# Patient Record
Sex: Male | Born: 1965 | Race: Black or African American | Hispanic: No | Marital: Married | Smoking: Current some day smoker
Health system: Southern US, Community
[De-identification: ages and names within clinical notes are randomized; demographics above are authoritative.]

## PROBLEM LIST (undated history)

## (undated) DIAGNOSIS — E669 Obesity, unspecified: Secondary | ICD-10-CM

## (undated) DIAGNOSIS — I10 Essential (primary) hypertension: Secondary | ICD-10-CM

## (undated) DIAGNOSIS — R079 Chest pain, unspecified: Secondary | ICD-10-CM

## (undated) DIAGNOSIS — N12 Tubulo-interstitial nephritis, not specified as acute or chronic: Secondary | ICD-10-CM

## (undated) DIAGNOSIS — M1990 Unspecified osteoarthritis, unspecified site: Secondary | ICD-10-CM

## (undated) DIAGNOSIS — R109 Unspecified abdominal pain: Secondary | ICD-10-CM

## (undated) DIAGNOSIS — J069 Acute upper respiratory infection, unspecified: Secondary | ICD-10-CM

## (undated) DIAGNOSIS — R600 Localized edema: Secondary | ICD-10-CM

## (undated) DIAGNOSIS — M7989 Other specified soft tissue disorders: Secondary | ICD-10-CM

## (undated) DIAGNOSIS — K219 Gastro-esophageal reflux disease without esophagitis: Secondary | ICD-10-CM

## (undated) DIAGNOSIS — M545 Low back pain, unspecified: Secondary | ICD-10-CM

## (undated) HISTORY — DX: Low back pain, unspecified: M54.50

## (undated) HISTORY — DX: Chest pain, unspecified: R07.9

## (undated) HISTORY — DX: Obesity, unspecified: E66.9

## (undated) HISTORY — DX: Unspecified abdominal pain: R10.9

## (undated) HISTORY — DX: Tubulo-interstitial nephritis, not specified as acute or chronic: N12

## (undated) HISTORY — DX: Acute upper respiratory infection, unspecified: J06.9

## (undated) HISTORY — DX: Localized edema: R60.0

## (undated) HISTORY — DX: Other specified soft tissue disorders: M79.89

## (undated) HISTORY — DX: Essential (primary) hypertension: I10

---

## 2013-03-18 ENCOUNTER — Emergency Department: Payer: BLUE CROSS/BLUE SHIELD

## 2013-03-18 ENCOUNTER — Emergency Department
Admission: EM | Admit: 2013-03-18 | Discharge: 2013-03-18 | Disposition: A | Discharge status: Home / Self Care | Payer: BLUE CROSS/BLUE SHIELD | Attending: Emergency Medicine | Admitting: Emergency Medicine

## 2013-03-18 DIAGNOSIS — K219 Gastro-esophageal reflux disease without esophagitis: Secondary | ICD-10-CM | POA: Insufficient documentation

## 2013-03-18 DIAGNOSIS — J40 Bronchitis, not specified as acute or chronic: Secondary | ICD-10-CM | POA: Insufficient documentation

## 2013-03-18 DIAGNOSIS — E669 Obesity, unspecified: Secondary | ICD-10-CM | POA: Insufficient documentation

## 2013-03-18 DIAGNOSIS — I10 Essential (primary) hypertension: Secondary | ICD-10-CM | POA: Insufficient documentation

## 2013-03-18 DIAGNOSIS — F17200 Nicotine dependence, unspecified, uncomplicated: Secondary | ICD-10-CM | POA: Insufficient documentation

## 2013-03-18 HISTORY — DX: Essential (primary) hypertension: I10

## 2013-03-18 HISTORY — DX: Gastro-esophageal reflux disease without esophagitis: K21.9

## 2013-03-18 LAB — ECG 12-LEAD
Atrial Rate: 98 {beats}/min
P Axis: 53 degrees
P-R Interval: 170 ms
Q-T Interval: 352 ms
QRS Duration: 92 ms
QTC Calculation (Bezet): 449 ms
R Axis: 39 degrees
T Axis: 11 degrees
Ventricular Rate: 98 {beats}/min

## 2013-03-18 MED ORDER — IPRATROPIUM BROMIDE 0.02 % IN SOLN
1.0000 mg | Freq: Once | RESPIRATORY_TRACT | Status: AC
Start: 2013-03-18 — End: 2013-03-18
  Administered 2013-03-18: 1 mg via RESPIRATORY_TRACT
  Filled 2013-03-18: qty 5

## 2013-03-18 MED ORDER — PREDNISONE 20 MG PO TABS
60.0000 mg | ORAL_TABLET | Freq: Once | ORAL | Status: AC
Start: 2013-03-18 — End: 2013-03-18
  Administered 2013-03-18: 60 mg via ORAL
  Filled 2013-03-18: qty 3

## 2013-03-18 MED ORDER — PREDNISONE 20 MG PO TABS
40.00 mg | ORAL_TABLET | Freq: Every day | ORAL | Status: AC
Start: 2013-03-18 — End: 2013-03-23

## 2013-03-18 MED ORDER — ACETAMINOPHEN 325 MG PO TABS
650.0000 mg | ORAL_TABLET | Freq: Once | ORAL | Status: AC
Start: 2013-03-18 — End: 2013-03-18
  Administered 2013-03-18: 650 mg via ORAL
  Filled 2013-03-18: qty 2

## 2013-03-18 MED ORDER — ALBUTEROL SULFATE HFA 108 (90 BASE) MCG/ACT IN AERS
2.0000 | INHALATION_SPRAY | RESPIRATORY_TRACT | Status: DC | PRN
Start: 2013-03-18 — End: 2013-04-17

## 2013-03-18 MED ORDER — ALBUTEROL SULFATE (5 MG/ML) 0.5% IN NEBU
5.0000 mg | INHALATION_SOLUTION | Freq: Once | RESPIRATORY_TRACT | Status: AC
Start: 2013-03-18 — End: 2013-03-18
  Administered 2013-03-18: 5 mg via RESPIRATORY_TRACT
  Filled 2013-03-18: qty 1

## 2013-03-18 NOTE — ED Provider Notes (Signed)
Physician/Midlevel provider first contact with patient: 03/18/13 1191         History     Chief Complaint   Patient presents with   . Cough     HPI Comments: 47 y.o. Male with PMHX HTN here with 2 days of cough congestion HA and wheezing. Pt had rx for Z pak and tessalon pearls from prior doc visit and started taking it. Pt also c/o cp with cough only. No n/v ab pain neck pain fever    Patient is a 47 y.o. male presenting with cough. The history is provided by the patient.   Cough  This is a new problem. The current episode started 2 days ago. The problem occurs constantly. The cough is productive of sputum. Associated symptoms include chest pain, headaches, rhinorrhea and wheezing. Pertinent negatives include no chills and no shortness of breath.       Past Medical History   Diagnosis Date   . Hypertension    . GERD (gastroesophageal reflux disease)        History reviewed. No pertinent past surgical history.    No family history on file.    Social  History   Substance Use Topics   . Smoking status: Light Tobacco Smoker   . Smokeless tobacco: Not on file   . Alcohol Use: Yes      Comment: occasionally        .     No Known Allergies    Current/Home Medications    AMLODIPINE-BENAZEPRIL (LOTREL) 10-20 MG PER CAPSULE    Take 1 capsule by mouth daily.    CLONIDINE (CATAPRES) 0.3 MG TABLET    Take 0.3 mg by mouth 2 (two) times daily.    HYDROCHLOROTHIAZIDE (HYDRODIURIL) 25 MG TABLET    Take 25 mg by mouth daily.    OMEPRAZOLE (PRILOSEC) 40 MG CAPSULE    Take 40 mg by mouth daily.        Review of Systems   Constitutional: Negative for fever and chills.   HENT: Positive for rhinorrhea and sneezing. Negative for congestion.    Eyes: Negative for pain and discharge.   Respiratory: Positive for cough and wheezing. Negative for chest tightness and shortness of breath.    Cardiovascular: Positive for chest pain. Negative for palpitations and leg swelling.   Gastrointestinal: Negative for nausea, vomiting, abdominal pain and  diarrhea.   Genitourinary: Negative for dysuria, frequency and hematuria.   Musculoskeletal: Negative for back pain.   Skin: Negative for pallor.   Neurological: Positive for headaches. Negative for dizziness, syncope, speech difficulty and light-headedness.   Psychiatric/Behavioral: Negative for behavioral problems.       Physical Exam    BP 170/74  Pulse 110  Temp 99 F (37.2 C)  Resp 18  Ht 1.93 m  Wt 208.655 kg  BMI 56.02 kg/m2  SpO2 93%    Physical Exam   Nursing note and vitals reviewed.  Constitutional: He is oriented to person, place, and time. He appears well-developed and well-nourished.        obese   Eyes: No scleral icterus.   Neck: Normal range of motion. Neck supple.   Cardiovascular: Normal rate and regular rhythm.    Pulmonary/Chest: Effort normal. He has wheezes (scattered difuse).        Speaking in full sent    Abdominal: Soft. Bowel sounds are normal.   Musculoskeletal: Normal range of motion. He exhibits no edema.   Neurological: He is alert and oriented to  person, place, and time.   Skin: Skin is warm and dry.   Psychiatric: He has a normal mood and affect. His behavior is normal. Judgment and thought content normal.       MDM and ED Course     ED Medication Orders      Start     Status Ordering Provider    03/18/13 0527   albuterol (PROVENTIL) nebulizer solution 5 mg   RT - Once      Route: Nebulization  Ordered Dose: 5 mg         Ordered Nachman Sundt R    03/18/13 0527   ipratropium (ATROVENT) 0.02 % nebulizer solution 1 mg   RT - Once      Route: Nebulization  Ordered Dose: 1 mg         Otilio Connors R    03/18/13 0527   predniSONE (DELTASONE) tablet 60 mg   Once      Route: Oral  Ordered Dose: 60 mg         Campbell Riches, Sophiya Morello R    03/18/13 0526   acetaminophen (TYLENOL) tablet 650 mg   Once      Route: Oral  Ordered Dose: 650 mg         Ordered Molleigh Huot R                 MDM  Neb xray ekg reeval    Procedures    Clinical Impression &  Disposition     Clinical Impression  Final diagnoses:   None        ED Disposition     None           New Prescriptions    No medications on file           EKG interpreted by me  NSR nl interval nl axis nl stt   PVC no prior ekg to Compare    .6:17 AM  Reexamined feels better  Lung only faint ext wheezing  vss will d/c with albuterol and prednisone   Pt had already started taking z pak will finish that  Follow up pmd in 24 hours              Joice Lofts, MD  03/18/13 [phone]

## 2013-03-18 NOTE — ED Notes (Signed)
Dr. Girma at bedside.

## 2013-03-18 NOTE — Discharge Instructions (Signed)
Take your antibiotics as prescribedBronchitis    You have been diagnosed with bronchitis.    Bronchitis is an irritation of the large breathing tubes. It can be caused by tobacco smoke, air pollution, or an infection. Patients with bronchitis are short of breath and may cough up green or yellow mucous. These symptoms are usually worse at night, when lying flat and in wet weather. Most people with bronchitis do not need antibiotics. If your doctor prescribes antibiotics, fill the prescription and take all the medicine according to the instructions.    Bronchitis is usually treated with medicine to help stop coughing. An inhaler with Albuterol or Ventolin is sometimes used to help with cough. It is best to use the inhaler with a spacer to help the medicine reach your lungs. The doctor can prescribe a spacer.    Bronchitis is usually caused by a virus. Antibiotics do not kill viruses. In fact, antibiotics do not affect viruses in any way. In the past, some doctors prescribed antibiotics for people with bronchitis. We now know that antibiotics are not helpful for most bronchitis patients. Patients who might need antibiotics are those with lung problems that don't go away, like emphysema or COPD.    Your coughing and wheezing might last for 2 or 3 weeks! The symptoms should get better over this time period and not worse.    Your doctor prescribed an Albuterol inhaler. Use it every four hours if you are wheezing, coughing, or short of breath. This will reduce symptoms.    Do not smoke. Research shows that smoking causes heart disease, cancer, and birth defects. Avoiding smoking will help your asthma. If you smoke, ask your doctor for ideas about how to stop.   If you do not smoke, avoid others who do.    YOU SHOULD SEEK MEDICAL ATTENTION IMMEDIATELY, EITHER HERE OR AT THE NEAREST EMERGENCY DEPARTMENT, IF ANY OF THE FOLLOWING OCCURS:   You wheeze or have trouble breathing.   You have a fever (temperature higher  than 101 F or 38.3 C), that won't go away.   You have chest pain.   You vomit or cannot keep liquids down or you feel weak or dizzy.   Your symptoms get worse over the next 2 or 3 days.

## 2013-03-18 NOTE — ED Notes (Signed)
Chest congestion onset 2 days ago + HA, Wheezing, reprots coughing up yellow sputum. Since yesterday pt has been taking tessalon pearls, and azithromycin 250 mg.

## 2014-04-14 ENCOUNTER — Emergency Department: Payer: BLUE CROSS/BLUE SHIELD

## 2014-04-14 ENCOUNTER — Emergency Department
Admission: EM | Admit: 2014-04-14 | Discharge: 2014-04-14 | Disposition: A | Discharge status: Home / Self Care | Payer: BLUE CROSS/BLUE SHIELD | Attending: Emergency Medicine | Admitting: Emergency Medicine

## 2014-04-14 DIAGNOSIS — K219 Gastro-esophageal reflux disease without esophagitis: Secondary | ICD-10-CM | POA: Insufficient documentation

## 2014-04-14 DIAGNOSIS — F17200 Nicotine dependence, unspecified, uncomplicated: Secondary | ICD-10-CM | POA: Insufficient documentation

## 2014-04-14 DIAGNOSIS — M25561 Pain in right knee: Secondary | ICD-10-CM

## 2014-04-14 DIAGNOSIS — M25569 Pain in unspecified knee: Secondary | ICD-10-CM | POA: Insufficient documentation

## 2014-04-14 DIAGNOSIS — M545 Low back pain, unspecified: Secondary | ICD-10-CM | POA: Insufficient documentation

## 2014-04-14 DIAGNOSIS — I10 Essential (primary) hypertension: Secondary | ICD-10-CM | POA: Insufficient documentation

## 2014-04-14 DIAGNOSIS — M5430 Sciatica, unspecified side: Secondary | ICD-10-CM | POA: Insufficient documentation

## 2014-04-14 DIAGNOSIS — M5442 Lumbago with sciatica, left side: Secondary | ICD-10-CM

## 2014-04-14 LAB — URINALYSIS, REFLEX TO MICROSCOPIC EXAM IF INDICATED
Bilirubin, UA: NEGATIVE
Blood, UA: NEGATIVE
Glucose, UA: NEGATIVE
Ketones UA: NEGATIVE
Leukocyte Esterase, UA: NEGATIVE
Nitrite, UA: NEGATIVE
Specific Gravity UA: 1.029 (ref 1.001–1.035)
Urine pH: 5.5 (ref 5.0–8.0)
Urobilinogen, UA: NORMAL mg/dL

## 2014-04-14 MED ORDER — IBUPROFEN 800 MG PO TABS
800.0000 mg | ORAL_TABLET | Freq: Three times a day (TID) | ORAL | Status: DC | PRN
Start: 2014-04-14 — End: 2016-09-26

## 2014-04-14 MED ORDER — OXYCODONE-ACETAMINOPHEN 5-325 MG PO TABS
ORAL_TABLET | ORAL | Status: DC
Start: 2014-04-14 — End: 2016-09-26

## 2014-04-14 MED ORDER — CYCLOBENZAPRINE HCL 10 MG PO TABS
10.0000 mg | ORAL_TABLET | Freq: Three times a day (TID) | ORAL | Status: AC | PRN
Start: 2014-04-14 — End: 2014-04-29

## 2014-04-14 MED ORDER — KETOROLAC TROMETHAMINE 30 MG/ML IJ SOLN
60.00 mg | Freq: Once | INTRAMUSCULAR | Status: AC
Start: 2014-04-14 — End: 2014-04-14
  Administered 2014-04-14: 60 mg via INTRAMUSCULAR
  Filled 2014-04-14: qty 2

## 2014-04-14 NOTE — Discharge Instructions (Signed)
Back Pain NOS    You have been seen for back pain.    Back pain can happen anywhere from the neck down to the low back. Back pain has many different causes. Some of the more common are: Bone pain, muscle strain, muscle spasm, pain from overuse, and pinched nerves. Other problems can cause what feels like back pain. But the pain is really coming from another organ. A kidney infection can cause lower back pain.    The x-rays of your back showed no broken bones.    The doctor still does not know the exact cause of your pain. Your problem does not seem to be from a dangerous cause. It is OK for you to go home today.    Some things you can try to help your back feel better are:   Apply a warm damp washcloth to the back where you have pain for 20 minutes at a time, at least 4 times per day.   Have someone massage the sore parts of your back.   Don t do any heavy lifting or bending. You can go back to normal daily activities if they don t make the pain worse.   You can use anti-inflammatory pain medicine for your pain. This could be Ibuprofen (Advil or Motrin). You can buy these at most stores. Follow the directions on the package.    This pain may last for the next few days. If your pain gets better, you probably do not need to see a doctor. However, if your symptoms get worse or you have new symptoms, you should return here or go to the nearest Emergency Department.    Call your doctor or go to the nearest Emergency Department if you your pain does not improve within 4 weeks or your pain is bad enough to seriously limit your normal activities.    YOU SHOULD SEEK MEDICAL ATTENTION IMMEDIATELY, EITHER HERE OR AT THE NEAREST EMERGENCY DEPARTMENT, IF ANY OF THE FOLLOWING OCCURS:   You think the pain is coming from somewhere other than your back. This can include chest pain. This is sometimes from angina (heart pains) or other dangerous causes.   You have shortness of breath, sweating, chest pain (or pressure,  heaviness, indigestion, etc).   You have abdominal (belly) pain that goes through to your back.   Your arms and legs tingle or get numb (lose feeling).   Your arms or legs are weak.   You lose control of your bladder or bowels. If this were to happen, it may cause you to wet or soil yourself.   You have problems urinating (peeing).   You have fever (temperature higher than 100.48F / 38C).   Your pain gets worse.    Joint Pain    You have been seen for joint pain.    Many things can cause pain in your joints. Having joint pain means that you often have inflammation in your joints.     Some things that can cause joint pain are:   Rheumatoid arthritis (inflammation of the joints).   Trauma or injury to your joints.   A viral infection.   Overuse of your joints.   Obesity that causes excess wear and tear on your joints.   Gout (swelling of the joints).   Osteoarthritis (breakdown of cartilage in the joints).   Inflammation of a joint tendon (tendinitis).    It is not yet known why you are having joint pains. You might need another exam  or more tests to find out why you have these symptoms. At this time, the cause of your symptoms does not seem dangerous. You don t need to stay in the hospital.    Some things you can try at home are:   Rest the affected joint.   Frequent stretching.   Massage.   NSAID medications like ibuprofen (Advil or Motrin), naproxen (Aleve, Naprosyn).   Elevating the joints that hurt.    Follow the instructions for any medication you are prescribed.     Follow up with your doctor in 7 days.    We don't believe your condition is dangerous right now. However, you need to be careful. Sometimes a problem that seems small can get serious later. Therefore, it is very important for you to come back here or go to the nearest Emergency Department if you don t get better or your symptoms get worse.    YOU SHOULD SEEK MEDICAL ATTENTION IMMEDIATELY, EITHER HERE OR AT THE  NEAREST EMERGENCY DEPARTMENT, IF ANY OF THE FOLLOWING OCCUR:     You have a fever (temperature higher than 100.371F or 38C).   Your pain does not go away or gets worse.   You notice redness over the joint.   Your painful joint gets very swollen.   You don t feel better after treatment.   Any other symptoms, concerns, or not getting better as expected.    If you can t follow up with your doctor, or if at any time you feel you need to be rechecked or seen again, come back here or go to the nearest emergency department.    Knee Pain NOS    You have been seen for knee pain.    There are a few causes for knee pain. The doctor feels your knee pain is not from an injury to your knee s bones or ligaments.     Injury to the ligaments or bones is not the only cause of knee pain. There are other causes. These include:   Tendonitis. This is the inflammation (swelling) of the tendons. Tendons are the thick cords that connect the muscles around the knee to the bones of the knee joint.   Bursitis. This is the inflammation (swelling) of the fluid-filled sacs that cushion the knee joint.   Arthritis (inflammation of joints).   Gout (swelling of the joints).   Knee injuries from overuse.    Some things you can do to treat your knee pain are:   Apply ice to the knee with an ice pack. Be sure to put a towel between the ice pack and your skin. NEVER PLACE DIRECTLY ON YOUR SKIN. You can do this for 15 minutes at a time, several times a day.   Use anti-inflammatory medicine like ibuprofen (Advil or Motrin) to help the pain and swelling.   Avoid doing things that put a lot of stress on your knee joints. This includes running or playing tennis.    YOU SHOULD SEEK MEDICAL ATTENTION IMMEDIATELY, EITHER HERE OR AT THE NEAREST EMERGENCY DEPARTMENT, IF ANY OF THE FOLLOWING OCCUR:   Your knee pain gets worse.   You have fever (temperature higher than 100.371F / 38C) or chills or your knee gets more red or warm.   You  have any other problems or concerns.                 [facility] Referral    You have been referred to the [facility] for management of your back problem. They have a personalized, comprehensive, multi-specialty approach as well as physical therapy centers, imaging centers and pain clinics. Please call them today or the next business day to arrange for follow-up care.    Phone: [phone]  Web: [email or web address]  [phone]  [phone]  [phone]  04/14/2014    Discharge Instructions    As always, you are the most important factor in your recovery.  Please follow these instructions carefully.  If you have problems that we have not discussed, CALL OR VISIT YOUR DOCTOR RIGHT AWAY.     If you can't reach your doctor, return to the emergency department.    I [name] understand the written and discussed instructions.  My questions have been answered.  I acknowledge receipt of these instructions.     Patient or responsible person:         Patient's Signature               Physician or Nurse

## 2014-04-14 NOTE — ED Provider Notes (Signed)
Physician/Midlevel provider first contact with patient: 04/14/14 0757                                      [facility] EMERGENCY DEPARTMENT H&P         CLINICAL SUMMARY          Diagnosis:    .     Final diagnoses:   Right-sided low back pain with left-sided sciatica   Arthralgia of both knees         MDM Notes:      [name] is a 48 y.o. male with left sided radicular pain radiating down left leg, right paraspinal lumbar pain and mild bilateral knee pain with no EO swelling or infection.  Neuro intact, likely lumbar radiculopathy vs. Sciatica, will treat with pain control, NSAIDs, muscle relaxers, follow up with pmd and spine.        Disposition:      ED Disposition    Discharge [name] discharge to home/self care.    Condition at disposition: Stable              Discharge Medication List as of 04/14/2014  9:01 AM      START taking these medications    Details   cyclobenzaprine (FLEXERIL) 10 MG tablet Take 1 tablet (10 mg total) by mouth 3 (three) times daily as needed for Muscle spasms., Starting 04/14/2014, Until Thu 04/29/14, Print      ibuprofen (ADVIL,MOTRIN) 800 MG tablet Take 1 tablet (800 mg total) by mouth every 8 (eight) hours as needed for Pain or Fever., Starting 04/14/2014, Until Discontinued, Print      oxyCODONE-acetaminophen (PERCOCET) 5-325 MG per tablet 1-2 tablets by mouth every 4-6 hours as needed for pain;  Do not drive or operate machinery while taking this medicine, Print                       CLINICAL INFORMATION        HPI:      Chief Complaint: Back Pain  .    [name] is a 48 y.o. male who  has a past medical history of Hypertension and GERD (gastroesophageal reflux disease). and  has no past surgical history on file. who presents with right low back pain and pain radiating to LLE, ongoing for last 4 days. 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 Pt denies any MVC or falls. Pt with h/o similar sxs with  pain radiating down LLE. Pain located to right lower back and posterior LLE from thigh to his calf. Pain worsens with extension of LLE and ambulation, and bending or lifting heavy objects. He also notes bilateral knee pain, worsens with ambulation. He denies any numbness/tingling to BLE, abdominal pain, difficulty urinating, or bowel/bladder incontinence.     History obtained from: Patient      ROS:      Positive and negative ROS elements as per HPI.      Physical Exam:      Pulse 100  BP 172/102 mmHg  Resp 17  SpO2 95 %  Temp 98.4 F (36.9 C)    GEN: . Nontoxic, Well appearing, NAD. Obese  Head: NC/AT.     Eyes: EOMI w/o pain, PERRL, nl conjunctiva, no pallor or icterus. NO d/c.    ENT:  MMM, symmetric OP, no drooling/trismus.    Neck: FROM, supple, no masses, no tenderness, no cervical lymphadenopathy.  Back: NO CVA tenderness, NO midline ttp.   Mild right paraspinal TTP  UpperExt: NO deformity. FROM, neurovasc intact.       LowerExt: NO edema. neurovasc intact. Tenderness over left posterior LE, sensation intact, 5/5 strength, FROM       Neuro: CN II-XII intact, moving all ext equally, no motor deficits, normal sensation throughout bilateral lower extremities, normal gait.     Skin: Warm and dry. NO rash.        Psych: Normal affect. Normal insight.                    PAST HISTORY        Primary Care Provider: [name], MD (General)        PMH/PSH:    .     Past Medical History   Diagnosis Date   . Hypertension    . GERD (gastroesophageal reflux disease)        He has no past surgical history on file.      Social/Family History:      He reports that he has been smoking.  He does not have any smokeless tobacco history on file. He reports that he drinks alcohol. His drug history is not on file.    No family history on file.      Listed Medications on Arrival:    .     Discharge Medication List as of 04/14/2014  9:01 AM      CONTINUE these medications which have NOT CHANGED    Details    amlodipine-benazepril (LOTREL) 10-20 MG per capsule Take 1 capsule by mouth daily., Until Discontinued, Historical Med      cloNIDine (CATAPRES) 0.3 MG tablet Take 0.3 mg by mouth 2 (two) times daily., Until Discontinued, Historical Med      hydrochlorothiazide (HYDRODIURIL) 25 MG tablet Take 25 mg by mouth daily., Until Discontinued, Historical Med      omeprazole (PRILOSEC) 40 MG capsule Take 40 mg by mouth daily., Until Discontinued, Historical Med            Allergies: He has No Known Allergies.            VISIT INFORMATION        Clinical Course in the ED:      Patient discharged by me at 9:06 AM.   Departure Condition: Good, stable  Mobility at Discharge: Ambulatory  Patient Teaching: Discharge instructions reviewed and patient verbalizes understanding  Departure Mode: By self          Medications Given in the ED:    .     ED Medication Orders    Start     Status Ordering Provider    04/14/14 0809  ketorolac (TORADOL) injection 60 mg   Once     Route: Intramuscular  Ordered Dose: 60 mg     Last MAR action:  Given [name]            Procedures:      Procedures      Interpretations:      Differential Diagnosis (not completely inclusive): sciatica, lumbar radiculopathy, lumbar spasm, knee swelling, arthritis    EKG was Reviewed, Analyzed and Interpreted by me: N/A    Rhythm Strip Interpretation / Cardiac Monitor Analysis: N/A    Pulse Ox Analysis: Yes: saturation: 95 %; Oxygen use: room air;  Interpretation: Normal      Critical Care Time: No Critical Care Time             RESULTS        Lab Results:      Results    Procedure Component Value Units Date/Time    UA, Reflex to Microscopic [[id number]]  (Abnormal) Collected:  04/14/14 0800    Specimen Information:  Urine Updated:  04/14/14 6962     Urine Type Clean Catch      Color, UA Yellow      Clarity, UA Clear      Specific Gravity UA 1.029      Urine pH 5.5      Leukocyte Esterase, UA Negative      Nitrite, UA Negative      Protein, UR Trace (A)       Glucose, UA Negative      Ketones UA Negative      Urobilinogen, UA Normal mg/dL      Bilirubin, UA Negative      Blood, UA Negative      RBC, UA 0 - 5 /hpf      WBC, UA 0 - 5 /hpf      Squamous Epithelial Cells, Urine 0 - 5 /hpf               Radiology Results:      Knee Left AP and Lateral   Final Result   History: Bilateral knee pain.      COMPARISON: None.      FINDINGS: Bilateral AP standing and lateral knee films were obtained. No   fracture nor dislocation is identified. Mild bilateral osteoarthritis is   present in the medial compartments with joint space narrowing and   osteophytosis. There is associated varus deformity. Calcific   tendinopathy is noted at the insertions of the proximal medial tibia.   Correlate clinically.      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 Bilateral medial calcific tendinopathy.      Phillips Odor, MD    04/14/2014 8:50 AM         Knee Right AP and Lateral   Final Result   History: Bilateral knee pain.      COMPARISON: None.      FINDINGS: Bilateral AP standing and lateral knee films were obtained. No   fracture nor dislocation is identified. Mild bilateral osteoarthritis is   present in the medial compartments with joint space narrowing and   osteophytosis. There is associated varus deformity. Calcific   tendinopathy is noted at the insertions of the proximal medial tibia.   Correlate clinically.      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 Bilateral medial calcific tendinopathy.      Phillips Odor, MD    04/14/2014 8:50 AM                     Scribe Attestation:      I was acting as a Neurosurgeon for Artis Flock, MD on [facility]   [name]  I am the first provider for this patient and I personally performed the services documented. [name] is scribing for me on [name],Gregory A. This note accurately reflects work and  decisions made by me.   Artis Flock, MD  Artis Flock, MD  04/14/14 1104

## 2014-04-14 NOTE — ED Notes (Signed)
LWBS written in error.

## 2014-06-03 ENCOUNTER — Emergency Department
Admission: EM | Admit: 2014-06-03 | Discharge: 2014-06-03 | Disposition: A | Discharge status: Home / Self Care | Payer: BLUE CROSS/BLUE SHIELD | Attending: Emergency Medicine | Admitting: Emergency Medicine

## 2014-06-03 ENCOUNTER — Emergency Department: Payer: BLUE CROSS/BLUE SHIELD

## 2014-06-03 DIAGNOSIS — I10 Essential (primary) hypertension: Secondary | ICD-10-CM | POA: Insufficient documentation

## 2014-06-03 DIAGNOSIS — J069 Acute upper respiratory infection, unspecified: Secondary | ICD-10-CM

## 2014-06-03 DIAGNOSIS — F17200 Nicotine dependence, unspecified, uncomplicated: Secondary | ICD-10-CM | POA: Insufficient documentation

## 2014-06-03 DIAGNOSIS — B9789 Other viral agents as the cause of diseases classified elsewhere: Secondary | ICD-10-CM | POA: Insufficient documentation

## 2014-06-03 DIAGNOSIS — K219 Gastro-esophageal reflux disease without esophagitis: Secondary | ICD-10-CM | POA: Insufficient documentation

## 2014-06-03 LAB — ECG 12-LEAD
Atrial Rate: 81 {beats}/min
P Axis: 57 degrees
P-R Interval: 182 ms
Q-T Interval: 370 ms
QRS Duration: 92 ms
QTC Calculation (Bezet): 429 ms
R Axis: 34 degrees
T Axis: 11 degrees
Ventricular Rate: 81 {beats}/min

## 2014-06-03 MED ORDER — BENZONATATE 100 MG PO CAPS
100.0000 mg | ORAL_CAPSULE | Freq: Three times a day (TID) | ORAL | Status: DC | PRN
Start: 2014-06-03 — End: 2022-11-07

## 2014-06-03 NOTE — ED Provider Notes (Signed)
[name] EMERGENCY DEPARTMENT RESIDENT H&P       CLINICAL INFORMATION        HPI:      Chief Complaint: Difficulty breathing  .    [name] is a 48 y.o. male hx of HTN presents w uri sx and chest tightness. Sx began 2 days ago, gradually, pt reports rhinorrea, cough without sputum production, chest tightness.  Pt also had mild HA which resolved with motrin.      Wife currently has similar sx.  Daughter previously had URI.    History obtained from: patient         ROS:      Review of Systems   Constitutional: Negative.    HENT: Positive for rhinorrhea.    Eyes: Negative.    Respiratory: Positive for cough and chest tightness. Negative for wheezing.    Cardiovascular: Negative for chest pain.   Gastrointestinal: Negative for abdominal pain.   Endocrine: Negative.    Genitourinary: Negative.    Musculoskeletal: Negative.    Skin: Negative.    Allergic/Immunologic: Negative.    Neurological: Negative.    Hematological: Negative.    Psychiatric/Behavioral: Negative.          Physical Exam:      Pulse 90  BP 138/77 mmHg  Resp 18  SpO2 98 %  Temp 98.3 F (36.8 C)    Physical Exam   Constitutional: He is oriented to person, place, and time. He appears well-developed and well-nourished. No distress.   HENT:   Head: Normocephalic and atraumatic.   Eyes: Conjunctivae are normal.   Neck: Neck supple. No JVD present. No tracheal deviation present.   Cardiovascular: Intact distal pulses.    Pulmonary/Chest: Effort normal and breath sounds normal. No stridor. No respiratory distress. He has no wheezes. He has no rales. He exhibits no tenderness.   No wheezing  Clear breath sounds bilaterally   Abdominal: Soft. He exhibits no distension and no mass. There is no tenderness. There is no rebound and no guarding.   Musculoskeletal: He exhibits no edema or tenderness.   Lymphadenopathy:     He has no cervical adenopathy.   Neurological: He is alert and oriented to person, place, and time.   Skin:  Skin is warm and dry. No rash noted. He is not diaphoretic. No erythema. No pallor.   Nursing note and vitals reviewed.              PAST HISTORY        Primary Care Provider: [name], MD        PMH/PSH:    .     Past Medical History   Diagnosis Date   . Hypertension    . GERD (gastroesophageal reflux disease)        He has no past surgical history on file.      Social/Family History:      He reports that he has been smoking Cigarettes.  He has been smoking about 0.00 packs per day. He does not have any smokeless tobacco history on file. He reports that he drinks alcohol. He reports that he does not use illicit drugs.    No family history on file.      Listed Medications on Arrival:    .     Discharge Medication List as of 06/03/2014  6:24 AM      CONTINUE these medications which have NOT CHANGED    Details   amlodipine-benazepril (LOTREL) 10-20  MG per capsule Take 1 capsule by mouth daily., Until Discontinued, Historical Med      cloNIDine (CATAPRES) 0.3 MG tablet Take 0.3 mg by mouth 2 (two) times daily., Until Discontinued, Historical Med      hydrochlorothiazide (HYDRODIURIL) 25 MG tablet Take 25 mg by mouth daily., Until Discontinued, Historical Med      ibuprofen (ADVIL,MOTRIN) 800 MG tablet Take 1 tablet (800 mg total) by mouth every 8 (eight) hours as needed for Pain or Fever., Starting 04/14/2014, Until Discontinued, Print      omeprazole (PRILOSEC) 40 MG capsule Take 40 mg by mouth daily., Until Discontinued, Historical Med      oxyCODONE-acetaminophen (PERCOCET) 5-325 MG per tablet 1-2 tablets by mouth every 4-6 hours as needed for pain;  Do not drive or operate machinery while taking this medicine, Print            Allergies: He has No Known Allergies.            VISIT INFORMATION        Reassessments/Clinical Course:      4:32 AM  EKG NSR, no st elevations, no t wave inversions  Likely viral uri  No   will dispo home with strict return precautions and close follow up, patient understands and agrees  with plan.  - [name]          Conversations with Other Providers:              Medications Given in the ED:    .     ED Medication Orders     None            Procedures:      Procedures      Assessment/Plan:      [name] is a 48 y.o. male hx of HTN presents w uri sx and chest tightness, EKG normal, no clinical signs of PNA, recommend over the counter decongestions, will dispo home with strict return precautions and close follow up, patient understands and agrees with plan.               Georgianne Fick, MD  Resident  06/03/14 1610    Georgianne Fick, MD  Resident  06/04/14 [phone]

## 2014-06-03 NOTE — ED Provider Notes (Signed)
Clinical Impression:   Final diagnoses:   Viral upper respiratory infection       Disposition:    ED Disposition     Discharge [name] discharge to home/self care.    Condition at disposition: Stable                        Attending Note:  Rennis Petty MD    The patient was seen and examined by the mid-level (physician's assistant or nurse practitioner), or resident/fellow, and the plan of care was discussed with me.  I have reviewed and agree with the history, physical exam, assessments, and/or procedures performed.    I have personally seen, evaluated, and examined this patient.    In brief: 48 y.o. male presents with congestion, rhinorrhea, cough, sore throat, for several days, and chest tightness onset yesterday.  Tightness described as "cold and congestion in my chest."  Denies chest pain.  Denies fever.  Pt's wife currently has similar sxs, and pt's daughter recently had similar sxs.  Pt also reports having a recent HA, which resolved with Motrin.      Exam:  Gen: VSS, NAD  Head: NCAT   Eyes: eyes nl to inspection  ENT: no OP edema.  No pharyngeal exudate or erythema.  Mildly dry mucus membranes.  Neck: supple  Card: Regular rate & rhythm   Resp: CTAB, no w/r/r  Abd: abd soft, nt.  No peritoneal signs  LE: No LE edema, No calf tenderness  Neuro: No focal neuro deficits  Skin: No rash      Differential: viral URI, bronchitis, pna      Plan:   ECG checked and normal.    No signs of PNA clinically on exam.    Sxs consistant with viral URI.    Recommends analgesics, decongestants (coricidin, ok for HTN pts), tessalon perles, rest, fluids, pmd f/u.  Pt understands and happy with plan.          ________________________________________________________________________    Monitor/Rhythm Strip: Sinus rhythm, rate 90     ECG: Reviewed and interpreted by myself: NSR at 81.  No ST elevations.  T wave inversion in III.  Normal intervals.      The vital signs and pulse ox were reviewed.    BP 138/77 mmHg   Pulse 90  Temp(Src) 98.3 F (36.8 C)  Resp 18  Ht 1.93 m  Wt 204.119 kg  BMI 54.80 kg/m2  SpO2 98%    Normal oxygen sat with no intervention needed.    ________________________________________________________________________    Amount and/or Complexity of Data Reviewed   First MD: Joyice Faster. Kathi Ludwig, MD   I am the first provider for this patient.      The past medical/surgical history, social history, family history, previous records, and medication list were reviewed by the ED attending (myself).    Clinical lab tests ordered and reviewed by myself: yes   All lab results interpreted by myself: yes   All radiology ordered and reviewed by myself: yes   All radiologic studies interpreted  by myself: yes   Independent visualization of images, tracings, or specimens: yes   Discuss the patient with other providers: yes   Decide to obtain previous medical records or to obtain history from someone other than the patient: yes   Review and summarize past medical records: yes   Nursing notes reviewed: yes       ________________________________________________________________________    Clinical Information:  PMH:   Past Medical History   Diagnosis Date   . Hypertension    . GERD (gastroesophageal reflux disease)          PSH:   History reviewed. No pertinent past surgical history.      FH:   No family history on file.    SH:  History     Social History   . Marital Status: Married     Spouse Name: N/A     Number of Children: N/A   . Years of Education: N/A     Occupational History   . Not on file.     Social History Main Topics   . Smoking status: Light Tobacco Smoker     Types: Cigarettes   . Smokeless tobacco: Not on file   . Alcohol Use: Yes      Comment: occasionally    . Drug Use: No   . Sexual Activity: Not on file     Other Topics Concern   . Not on file     Social History Narrative       ALL:   No Known Allergies    Home Meds:  Current/Home Medications    AMLODIPINE-BENAZEPRIL (LOTREL) 10-20 MG PER CAPSULE     Take 1 capsule by mouth daily.    CLONIDINE (CATAPRES) 0.3 MG TABLET    Take 0.3 mg by mouth 2 (two) times daily.    HYDROCHLOROTHIAZIDE (HYDRODIURIL) 25 MG TABLET    Take 25 mg by mouth daily.    IBUPROFEN (ADVIL,MOTRIN) 800 MG TABLET    Take 1 tablet (800 mg total) by mouth every 8 (eight) hours as needed for Pain or Fever.    OMEPRAZOLE (PRILOSEC) 40 MG CAPSULE    Take 40 mg by mouth daily.    OXYCODONE-ACETAMINOPHEN (PERCOCET) 5-325 MG PER TABLET    1-2 tablets by mouth every 4-6 hours as needed for pain;  Do not drive or operate machinery while taking this medicine         ROS: Positive and negative ROS elements as per HPI.  All Other Systems Reviewed and Negative: Yes    ________________________________________________________________________    Attestations:    I was acting as a scribe for Rennis Petty, MD on [facility]  Treatment Team: Scribe: April Manson    I am the first provider for this patient and I personally performed the services documented.  Treatment Team: Scribe: April Manson is scribing for me on Jakubek,Kelwin A. This note accurately reflects work and decisions made by me.  Rennis Petty, MD      Lenny Pastel, MD  06/03/14 [phone]

## 2014-06-03 NOTE — ED Notes (Signed)
pt thinks he got a cold from his infant daughter, c/o chest tightness, difficulty breathing, headache, with dry cough pt states he took motrin around 2230 w/ some relief

## 2014-06-03 NOTE — Discharge Instructions (Signed)
You were seen today for a viral respiratory illness  Take coricidin over the counter to help relieve your symptoms  Follow up with your primary doctor as needed  Return to the ER for worsening symptoms, chest pain, shortness of breath, or any other concerns.  --      Upper Respiratory Infection    You have been diagnosed with a viral upper respiratory infection, often called a "URI."    The symptoms of a viral upper respiratory infection may include fever (temperature higher than 100.32F / 38C), runny nose, congestion and sinus fullness, facial pain, earache, sore throat, cough, and occasionally wheezing. The symptoms usually improve within 3 to 4 days. It might take up to 10 days before you feel completely better.    URI's are treated with fluids, rest, and medication for fever and pain. Sometimes decongestants, cough medications or-medications for wheezing can also help.    Antibiotics have NO effect whatsoever on viruses and ARE NOT needed for a URI. Taking antibiotics when they are not necessary can cause side effects (like diarrhea or allergic reactions). It can also cause resistance, meaning antibiotics won't work when you need them in the future.    You do not need to follow up with a doctor unless you feel worse or have other concerns.    YOU SHOULD SEEK MEDICAL ATTENTION IMMEDIATELY, EITHER HERE OR AT THE NEAREST EMERGENCY DEPARTMENT, IF ANY OF THE FOLLOWING OCCURS:   You have new or worse symptoms or concerns.   You are short of breath.   You have a severe headache, stiff neck, confusion, or problems thinking.   You have nasal discharge, fever (temperature higher than 100.32F / 38C), or productive cough (one that brings up mucous from the lungs) lasting more that 10 days. Occasionally a viral cold may lead into a bacterial infection, such as a sinus infection, ear infection or pneumonia. Taking antibiotics during the cold will NOT prevent these infections, but if a secondary infection occurs,  you might require additional treatment.

## 2014-12-11 ENCOUNTER — Emergency Department
Admission: EM | Admit: 2014-12-11 | Discharge: 2014-12-11 | Disposition: A | Discharge status: Home / Self Care | Payer: BLUE CROSS/BLUE SHIELD | Attending: Emergency Medicine | Admitting: Emergency Medicine

## 2014-12-11 ENCOUNTER — Emergency Department: Payer: BLUE CROSS/BLUE SHIELD

## 2014-12-11 DIAGNOSIS — I10 Essential (primary) hypertension: Secondary | ICD-10-CM | POA: Insufficient documentation

## 2014-12-11 DIAGNOSIS — H9201 Otalgia, right ear: Secondary | ICD-10-CM | POA: Insufficient documentation

## 2014-12-11 DIAGNOSIS — B309 Viral conjunctivitis, unspecified: Secondary | ICD-10-CM | POA: Insufficient documentation

## 2014-12-11 DIAGNOSIS — F17210 Nicotine dependence, cigarettes, uncomplicated: Secondary | ICD-10-CM | POA: Insufficient documentation

## 2014-12-11 DIAGNOSIS — K219 Gastro-esophageal reflux disease without esophagitis: Secondary | ICD-10-CM | POA: Insufficient documentation

## 2014-12-11 DIAGNOSIS — J3489 Other specified disorders of nose and nasal sinuses: Secondary | ICD-10-CM | POA: Insufficient documentation

## 2014-12-11 DIAGNOSIS — R0981 Nasal congestion: Secondary | ICD-10-CM

## 2014-12-11 MED ORDER — ERYTHROMYCIN 5 MG/GM OP OINT
TOPICAL_OINTMENT | Freq: Four times a day (QID) | OPHTHALMIC | Status: AC
Start: 2014-12-11 — End: 2014-12-16

## 2014-12-11 NOTE — ED Provider Notes (Signed)
Physician/Midlevel provider first contact with patient: 12/11/14 0521         Diagnosis   1. Acute viral conjunctivitis of left eye    2. Otalgia, right    3. Sinus congestion        Disposition   ED Disposition     Discharge [name] discharge to home/self care.    Condition at disposition: Stable             Meds administered in ER:   Medications - No data to display  Discharge Medication List as of 12/11/2014  5:27 AM      START taking these medications    Details   erythromycin [facility]) ophthalmic ointment Place into the left eye every 6 (six) hours. Apply small ribbon to lower lid 4 times [name] day for 5 days, Starting 12/11/2014, Until Thu 12/16/14, Print            __________________________________________________   Chief Complaint  Chief Complaint   Patient presents with   . Eye Problem   . Otalgia         HPI   [name] is [name] 49 y.o. male h/o hypertension, who presents with an itchy L eye that has been getting progressively worse for the past 2-3 days. Assoc with sore throat, R ear pain, and headache. No abd pain or diarrhea. +Sick contact with family members diagnosed with conjunctivitis "pink eye".       PMD   Gregory Records, MD    ROS   -Documented in HPI. Otherwise all other systems reviewed and negative.   -Nursing notes reviewed by me.     -Past Medical/Surgical/Family/Social History reviewed by me     PE    Filed Vitals:    12/11/14 0507   BP: 136/87   Pulse: 99   Temp: 96.2 F (35.7 C)   Resp: 17   Height: 6\' 4"  (1.93 m)   Weight: 185.975 kg   SpO2: 94%        Vitals reviewed and interpreted by me  GEN: Nontoxic, Well appearing, NAD, very pleasant.  Head: NC/AT.  Eyes: EOMI w/o pain, L eye with injected conjunctiva. NO d/c. Normal vision/no fb sensation.  ENT: MMM, no drooling/trismus, symmetric OP, no exudates/masses, TM's clear bilaterally   Neck: FROM, supple, no masses, no tenderness, no meningeal signs  Chest: ctab, NO resp distress. Equal chest rise.  CV: rrr, NO significant  murmur appreciated.  Abd: no peritoneal signs, benign, no distention, soft, no tenderness   Back: NO CVAt  UpperExt: NO deformity. FROM   LowerExt: NO deformity. FROM   Neuro: moving all ext equally, no motor deficits, normal gait, cnii-xii intact  Skin: Warm and dry. NO rash.  Psych: Normal affect. Normal insight.      Differential Diagnosis (not completely inclusive):   C/w conjunctivitis  Not c/w fb, globe rupture, corneal abrasion/ulcer, orbital cellulitis    Nontoxic with typical sinus congestion symptoms x 2-3 days w/+family sick contacts w/similar  Not c/w meningitis/encephalitis, no neck stiffness, very alert and lucid, very nontoxic  Not c/w PNA, CTAB, no cough  Not c/w myocarditis, no cp/sob  Not c/w intraabdominal surgical pathology, benign nontender exam  Not c/w UTI/pyelo, no dysuria/no cvat  NOT Septic, very NONTOXIC      MDM / ED Course:   Patient comfortable at discharge.   -At discharge, pt looked well, nontoxic, no distress, and is good candidate for outpatient follow up. No signs of  toxicity to suggest need for further labs, imaging or for admission.   -All questions were answered.   -Pt is ambulatory on d/c without difficulty.    -Pt will return if any new or worsening symptoms or concerns  -Instructed to f/u PCP tomorrow, discussed use of antihistamines and decongestants      Critical Care Time: No Critical Care Time    EKG - Reviewed and Interpreted by me: [name], M.D.   N/[name]    Pulse Oximetry Analysis interpreted by me - Normal on RA 100%  Laboratory results reviewed by me:  N\[name]  Radiologic study results reviewed by me: N\[name]  Radiologic Studies Interpreted (viewed) by me: N\[name]   Radiologic Interpretation by me: NA    Gregory Caffey, MD   _________________________________________________________________   BP 136/87 mmHg  Pulse 99  Temp(Src) 96.2 F (35.7 C)  Resp 17  Ht 6\' 4"  (1.93 m)  Wt 185.975 kg  BMI 49.93 kg/m2  SpO2 94%   Past Medical History   Diagnosis Date   . Hypertension    . GERD  (gastroesophageal reflux disease)       History reviewed. No pertinent past surgical history.   History reviewed. No pertinent family history.   History     Social History   . Marital Status: Married     Spouse Name: N/[name]   . Number of Children: N/[name]   . Years of Education: N/[name]     Social History Main Topics   . Smoking status: Light Tobacco Smoker     Types: Cigarettes   . Smokeless tobacco: Not on file   . Alcohol Use: Yes      Comment: occasionally    . Drug Use: No   . Sexual Activity: Not on file     Other Topics Concern   . Not on file     Social History Narrative     Labs:   Labs Reviewed - No data to display  Radiology Results (24 Hour)     ** No results found for the last 24 hours. **        ___________________________________________________________________  I was acting as [name] Neurosurgeon for [name], M.D. on [name]  Treatment Team: Scribe: [name]     I am the first provider for this patient and I personally performed the services documented. Treatment Team: Scribe: [name] is scribing for me on [name],[name]. This note accurately reflects work and decisions made by me.  [name], M.D.  ___________________________________________________________________          Gregory Offer, MD  12/11/14 [phone]

## 2014-12-11 NOTE — ED Notes (Signed)
Pt complains of left eye irritation and redness and sinus congestion.

## 2014-12-11 NOTE — Discharge Instructions (Signed)
It was a pleasure taking care of you in the Emergency room today.  As discussed, read the attached informational materials, and if you have any new or worsening symptoms, or any new concerns return to the hospital.    Otherwise, get some rest, stay well hydrated, and follow up with your primary care doctor (bring these papers which include your medications) tomorrow.    As discussed, take over the counter anti-histamines like Allegra, Zyrtec, Claritin, or Benadryl (will cause drowsiness), to dry up your sinus congestion.  As well as a decongestant, see below:    Decongestant Use (EDU)    A decongestant is a medicine that helps to dry up a runny nose. It relieves the stuffy feeling that usually goes along with a cold. It is also used to treat congestion caused by allergies, hay fever, or sinus irritation. Decongestants work by shrinking the blood vessels in your nose for a few hours. There are two main types of decongestants: Oral medications (the kind you swallow) and nasal sprays.    Pseudoephedrine is a common oral decongestant that you can buy over-the-counter. Sudafed is a well-known brand of pseudoephedrine.    Phenylephrine is another common oral decongestant that you can buy over the counter. Sudafed PE is a well-known brand of oral phenylephrine.    Oral decongestants have some side-effects. They can make you dizzy or sleepy. Some people may become agitated or restless. Be careful when driving until you know how the medicine affects you. Because the medicine moves around your blood stream, it can constrict blood vessels other than the ones in your nose. This can cause your blood pressure to go up. Talk to your doctor before taking decongestants if you have any of the following:    High blood pressure.   Heart disease.   Problems urinating or an enlarged prostate.   Diabetes.   Thyroid problems.   Glaucoma.    If you are pregnant or breast-feeding, talk to your OB/GYN before taking any  over-the-counter medicines. The [employer] (ACOG) recommends that pregnant women do not take pseudoephedrine or phenylephrine during the first trimester (weeks one to 13). These medications are generally considered safe later in pregnancy.    The American Academy of Pediatrics and the Working Group on Human Lactation say it is safe to use pseudoephedrine occasionally while breastfeeding. Phenylephrine is safe to use while breastfeeding.    If you try a decongestant medicine and your congestion does not get better, talk to your regular doctor or a doctor who specializes in ear, nose, and throat disorders.                Conjunctivitis    You were diagnosed with conjunctivitis. This is also called "pink eye".    Conjunctivitis is an inflammation of the conjunctiva. These are the thin coverings of the white part of the eye and insides of the eyelids. It is caused by many different things. This includes viruses and bacteria. It even includes chemicals. Particles of "junk" that irritate the eye can also be a cause. Viruses are the most common cause.    Symptoms of conjunctivitis include pink eye and redness and drainage. It may feel like there is something in your eye (foreign body sensation). Your lid may get swollen. Your eyelids may also mat or get stuck in the morning.    Conjunctivitis can be very contagious. This means it can easily spread to others. You SHOULD NOT share hygienic items.  This includes towels, make-up and tissues. You SHOULD NOT share clothing items. Wash your hands several times a day. Avoid touching your eyes.    Bacterial conjunctivitis is treated with warm compresses. It is also treated with ophthalmic (eye) antibiotics. These antibiotics are topical (not swallowed). Medicine is generally used for 5-7 days.    You SHOULD NOT wear contact lenses while you have conjunctivitis. Wait 48 hours after the infection completely clears up before using them  again.    There is no specific follow-up needed. However, if you get any of the problems listed below, you will need a follow-up. If you don t get better as expected, you will also need a follow-up. It is always a good idea to get rechecked by your eye doctor if possible.    YOU SHOULD SEEK MEDICAL ATTENTION IMMEDIATELY, EITHER HERE OR AT THE NEAREST EMERGENCY DEPARTMENT, IF ANY OF THE FOLLOWING OCCURS:   Increasing eye pain.   Vision problems (problems seeing).   Photophobia (light bothering your eyes).   You don t get better after a few days or symptoms get worse at any time.

## 2014-12-11 NOTE — ED Notes (Signed)
Pt signed ED discharge instructions.

## 2016-09-26 ENCOUNTER — Emergency Department: Payer: BLUE CROSS/BLUE SHIELD

## 2016-09-26 ENCOUNTER — Other Ambulatory Visit: Payer: Self-pay

## 2016-09-26 ENCOUNTER — Emergency Department
Admission: EM | Admit: 2016-09-26 | Discharge: 2016-09-26 | Disposition: A | Discharge status: Home / Self Care | Payer: BLUE CROSS/BLUE SHIELD | Attending: Emergency Medicine | Admitting: Emergency Medicine

## 2016-09-26 DIAGNOSIS — M5441 Lumbago with sciatica, right side: Secondary | ICD-10-CM | POA: Insufficient documentation

## 2016-09-26 DIAGNOSIS — I10 Essential (primary) hypertension: Secondary | ICD-10-CM | POA: Insufficient documentation

## 2016-09-26 DIAGNOSIS — K219 Gastro-esophageal reflux disease without esophagitis: Secondary | ICD-10-CM | POA: Insufficient documentation

## 2016-09-26 DIAGNOSIS — M545 Low back pain: Secondary | ICD-10-CM

## 2016-09-26 DIAGNOSIS — R600 Localized edema: Secondary | ICD-10-CM

## 2016-09-26 DIAGNOSIS — F17210 Nicotine dependence, cigarettes, uncomplicated: Secondary | ICD-10-CM | POA: Insufficient documentation

## 2016-09-26 MED ORDER — NAPROXEN 500 MG PO TABS
500.0000 mg | ORAL_TABLET | Freq: Two times a day (BID) | ORAL | 0 refills | Status: AC
Start: 2016-09-26 — End: 2016-10-06
  Filled 2016-09-26: qty 20, 10d supply, fill #0

## 2016-09-26 MED ORDER — CYCLOBENZAPRINE HCL 10 MG PO TABS
10.0000 mg | ORAL_TABLET | Freq: Three times a day (TID) | ORAL | 0 refills | Status: DC | PRN
Start: 2016-09-26 — End: 2016-10-10
  Filled 2016-09-26: qty 15, 5d supply, fill #0

## 2016-09-26 NOTE — ED Provider Notes (Signed)
[facility] EMERGENCY DEPARTMENT H&P      Visit date: 09/26/2016      CLINICAL SUMMARY          Diagnosis:    .     Final diagnoses:   Right low back pain, unspecified chronicity, with sciatica presence unspecified   Leg edema, left         MDM Notes:      LBP; sciatica vs strain vs cord compression vs DVT/muscle strain         Disposition:      Discharge         Discharge Prescriptions     Medication Sig Dispense Auth. Provider    cyclobenzaprine (FLEXERIL) 10 MG tablet Take 1 tablet (10 mg total) by mouth 3 (three) times daily as needed for Muscle spasms.for up to 15 doses 15 tablet Gregory Reinitz T, MD    naproxen (NAPROSYN) 500 MG tablet Take 1 tablet (500 mg total) by mouth 2 (two) times daily with meals.for 10 days 20 tablet [name], Lu Duffel, MD                      CLINICAL INFORMATION        HPI:      Chief Complaint: Leg Swelling  .    [name] is a 51 y.o. male w/pmhx HTN, GERD who presents with few weeks Rt flan pain and Lt calf swelling starting yesterday. Has Rt sided pain when sitting/laying down and when standing from sitting position.  States he has thrown his back out in the past but has not done any heavy lifting recently.  Is following up w/prostate specialist tomorrow.  Takes percocet and Motrin 800 for pain, prescribed by PMD.  Denies n/v/d/f, rash, CP, SOB, dysuria, urinary or bowel incontinence.     History obtained from: Patient          ROS:      Positive and negative ROS elements as per HPI.  All other systems reviewed and negative.      Physical Exam:      Pulse (!) 102  BP 133/81  Resp 18  SpO2 97 %  Temp 97.5 F (36.4 C)    Physical Exam   Constitutional: He is oriented to person, place, and time. He appears well-developed and well-nourished. No distress.   HENT:   Head: Normocephalic and atraumatic.   Nose: Nose normal.   Mouth/Throat: Oropharynx is clear and moist.   Eyes: Conjunctivae are normal. Pupils are equal, round, and  reactive to light. No scleral icterus.   Neck: Normal range of motion. Neck supple. No tracheal deviation present.   Pulmonary/Chest: No stridor.   Abdominal: Soft. There is no tenderness.   Musculoskeletal: Normal range of motion. He exhibits no edema or tenderness.   No midline [name]/L spine tenderness. No step offs or deformity.  Moving all extremities 5/5 motor strength.   Neurological: He is alert and oriented to person, place, and time. He exhibits normal muscle tone. Coordination normal.   Skin: Skin is warm and dry. No rash noted. He is not diaphoretic. No erythema.   Psychiatric: He has a normal mood and affect. His behavior is normal. Judgment and thought content normal.   Nursing note and vitals reviewed.            PAST HISTORY        Primary Care Provider: [name], MD        PMH/PSH:    .  Past Medical History:   Diagnosis Date   . GERD (gastroesophageal reflux disease)    . Hypertension        He has no past surgical history on file.      Social/Family History:      He reports that he has been smoking Cigarettes.  He uses smokeless tobacco. He reports that he drinks alcohol. He reports that he does not use drugs.    No family history on file.      Listed Medications on Arrival:    .     Home Medications     Med List Status:  In Progress Set By: Gregory Shell, RN at 09/26/2016  2:06 PM                amlodipine-benazepril (LOTREL) 10-20 MG per capsule     Take 1 capsule by mouth daily.     benzonatate (TESSALON) 100 MG capsule     Take 1 capsule (100 mg total) by mouth 3 (three) times daily as needed for Cough.     cloNIDine (CATAPRES) 0.3 MG tablet     Take 0.3 mg by mouth 2 (two) times daily.     hydrochlorothiazide (HYDRODIURIL) 25 MG tablet     Take 25 mg by mouth daily.     omeprazole (PRILOSEC) 40 MG capsule     Take 40 mg by mouth daily.                             Allergies: He has No Known Allergies.            VISIT INFORMATION        Clinical Course in the ED:      ED Course     Ultrasound neg for DVT, most likely sciatica R, reassurance, f/u with PMD for further outpatient evaluation, if symptoms persist or worsen will require MRI, return instructions given.      Medications Given in the ED:    .     ED Medication Orders     None            Procedures:      Procedures      Interpretations:      O2 sat-           saturation: 97 %; Oxygen use: room air; Interpretation: Normal                   RESULTS        Lab Results:      Results     ** No results found for the last 24 hours. **              Radiology Results:      [place] VenoDopp Low Extremity Bilateral   Final Result    No evidence of deep vein thrombosis involving either lower   extremity. Limited calf and assessment.      Gregory Sabal, MD    09/26/2016 1:58 PM                  Scribe Attestation:      I was acting as a Neurosurgeon for Gregory Evener, MD on [facility]  Treatment Team: Scribe: [name]     I am the first provider for this patient and I personally performed the services documented. Treatment Team: Scribe: [name] is scribing for me on [name],[name].  This note and the patient instructions accurately reflect work and decisions made by me.  Gregory Evener, MD          Gregory Evener, MD  09/30/16 [phone]

## 2016-09-26 NOTE — ED Provider Notes (Signed)
Triage note: Initial testing was ordered based on presenting complaint in order to expedite care. I am not the primary provider for this patient.      Koleen Distance, MD  09/26/16 [phone]

## 2016-09-26 NOTE — Discharge Instructions (Signed)
Dear [name]:    Thank you for choosing the [facility] Emergency Department, the premier emergency department in the [place] area.  I hope your visit today was EXCELLENT.    Specific instructions for your visit today:    Follow up with your primary care doctor regarding your visit.   Do not take Naproxen and Ibuprofen together. Do not take Flexeril before driving as it can make your drowsy.    Back Pain, Lumbar NOS    You have been seen for low back pain.     This area is also called the lumbar spine.    Pain in the low back is very a common problem. This pain is usually due to overuse of the muscles, tension or a strain of the muscles. There can also be damage to the discs that cushion the bones in the spine. This can cause irritation of the nerves that exit the spinal canal in the low back.    Your doctor did not find any pain over the bones in your back (even though you might have pain in the back muscles). This means it is very unlikely that you have a broken bone in your back. Your doctor did not think it was necessary to take an x-ray.    The doctor still does not know the exact cause of your pain. Your problem does not seem to be from a dangerous cause. It is OK for you to go home today.    Some things you can try to help your back feel better are:   Apply a warm damp washcloth to the back for 20 minutes at a time, at least 4 times per day. This will reduce your pain. Massaging your back might also help.   Have someone massage the sore parts of your back.   Don't do any heavy lifting or bending. You can go back to normal daily activities if they don't make the pain worse.   Use the over-the-counter anti-inflammatory medication ibuprofen (also known as Advil or Motrin) as directed on the package to help with pain and inflammation.    It is normal for the pain to last for the next few days. If your pain gets better, you probably do not need to see a doctor. However, if your symptoms get  worse or you have new symptoms, you should return here or go to the nearest Emergency Department.    Call your doctor or go to the nearest Emergency Department if you your pain does not improve or your pain is bad enough to seriously limit your normal activities.    YOU SHOULD SEEK MEDICAL ATTENTION IMMEDIATELY, EITHER HERE OR AT THE NEAREST EMERGENCY DEPARTMENT, IF ANY OF THE FOLLOWING OCCURS:   You think the pain is coming from somewhere other than your back. This can include pelvic pain. This can be from infections in the pelvis or lower belly.   You have abdominal (belly) pain that goes through to your back.   Your legs tingle or get numb (lose feeling).   Your legs are weak.   You have fever (temperature higher than 100.55F / 38C) along with back pain.   Your back pain is getting worse.   You lose control of your bladder or bowels. If this were to happen, it may cause you to wet or soil yourself.   You have problems urinating (peeing).      DVT LE Ruled Out    You were seen for your  leg symptoms.    This can include redness, swelling and/or pain. In these cases, the doctor is often worried that you had a blood clot in your leg. This is also called a deep venous thrombosis (DVT).    The diagnosis is often from an ultrasound of the leg. This is also called a duplex Doppler ultrasound. This is a non-invasive (on the outside of your body) test. It can detect a blood clot in the veins. An ultrasound specialist does the test. A radiologist or vascular (blood vessel) specialist looks at it. The test uses sound waves to look at the veins. It can help decide if they have blood flowing in them or if they are clotted. The test is very good at finding blood clots in the upper leg (thigh). It can also find clots in the lower leg (calf). However, the test is not as good in this part of the leg. The smaller the clot, the harder it can be to find. In these cases, the ultrasound can be re-done in a few days. The  doctor will decide if this is needed.    The ultrasound of your leg was normal. It did not show any blood clots.    The exact cause of your problem is still not known. We don't know the exact cause of your symptoms. However, your doctor thinks it's safe for you to go home. You may need to see your doctor or referral doctor for more tests.    Medical problems can be very complex and some conditions can be very difficult to figure out, even with advanced medical testing. It is important to understand that even after an Emergency Department (or Urgent Care) visit or an observation stay, you may still have a serious medical problem.    YOU SHOULD SEEK MEDICAL ATTENTION IMMEDIATELY, EITHER HERE OR AT THE NEAREST EMERGENCY DEPARTMENT, IF ANY OF THE FOLLOWING OCCURS:   Your leg swelling gets worse.   Your legs get red and you have pain / fever (temperature higher than 100.5F / 38C).   You have any chest pain or tightness or pressure over your heart.   You are short of breath, especially if there is pain in your chest when you breathe in.   Your leg feels numb (no feeling) or cool.   Fainting.   Your symptoms or problems get worse.      If you do not continue to improve or your condition worsens, please contact your doctor or return immediately to the Emergency Department.    Sincerely,  Alcide Evener,*  Attending Emergency Physician  [facility] Emergency Department    ONSITE PHARMACY  Our full service onsite pharmacy is located in the ER waiting room.  Open 7 days a week from 9 am to 11 pm.  We accept all major insurances and prices are competitive with major retailers.  Ask your provider to print your prescriptions down to the pharmacy to speed you on your way home.    OBTAINING A PRIMARY CARE APPOINTMENT    Primary care physicians (PCPs, also known as primary care doctors) are either internists or family medicine doctors. Both types of PCPs focus on health promotion, disease prevention,  patient education and counseling, and treatment of acute and chronic medical conditions.    Call for an appointment with a primary care doctor.  Ask to see who is taking new patients.     [facility]  telephone:  [phone]  [email or web address]  DOCTOR REFERRALS  Call [phone] (available 24 hours a day, 7 days a week) if you need any further referrals and we can help you find a primary care doctor or specialist.  Also, available online at:  [email or web address]    YOUR CONTACT INFORMATION  Before leaving please check with registration to make sure we have an up-to-date contact number.  You can call registration at [phone] to update your information.  For questions about your hospital bill, please call [phone].  For questions about your Emergency Dept Physician bill please call [phone].      FREE HEALTH SERVICES  If you need help with health or social services, please call 2-1-1 for a free referral to resources in your area.  2-1-1 is a free service connecting people with information on health insurance, free clinics, pregnancy, mental health, dental care, food assistance, housing, and substance abuse counseling.  Also, available online at:  http://www.[facility].org    MEDICAL RECORDS AND TESTS  Certain laboratory test results do not come back the same day, for example urine cultures.   We will contact you if other important findings are noted.  Radiology films are often reviewed again to ensure accuracy.  If there is any discrepancy, we will notify you.      Please call [phone] to pick up a complimentary CD of any radiology studies performed.  If you or your doctor would like to request a copy of your medical records, please call [phone].      ORTHOPEDIC INJURY   Please know that significant injuries can exist even when an initial x-ray is read as normal or negative.  This can occur because some fractures (broken bones) are not initially visible on  x-rays.  For this reason, close outpatient follow-up with your primary care doctor or bone specialist (orthopedist) is required.    MEDICATIONS AND FOLLOWUP  Please be aware that some prescription medications can cause drowsiness.  Use caution when driving or operating machinery.    The examination and treatment you have received in our Emergency Department is provided on an emergency basis, and is not intended to be a substitute for your primary care physician.  It is important that your doctor checks you again and that you report any new or remaining problems at that time.      24 HOUR PHARMACIES  The nearest 24 hour pharmacy is:    [facility]  [address]  [place], [place] [address]  [phone]      ASSISTANCE WITH INSURANCE    Affordable Care Act  [facility])  Call to start or finish an application, compare plans, enroll or ask a question.  [phone]  TTY: 6570659132  Web:  Healthcare.gov    Help Enrolling in [facility]  [employer] [place]  431-073-3084 (TOLL-FREE)  [phone] (TTY)  Web:  Http://www.coverva.org    Local Help Enrolling in the [facility]  [place] [facility]  4633972364 (MAIN)  Email:  health-help@nvfs .org  Web:  [employer]jackMyths.is  Address:  [address], [address] [place], [place] [address]    SEDATING MEDICATIONS  Sedating medications include strong pain medications (e.g. narcotics), muscle relaxers, benzodiazepines (used for anxiety and as muscle relaxers), Benadryl/diphenhydramine and other antihistamines for allergic reactions/itching, and other medications.  If you are unsure if you have received a sedating medication, please ask your physician or nurse.  If you received a sedating medication: DO NOT drive a car. DO NOT operate machinery. DO NOT perform jobs where you  need to be alert.  DO NOT drink alcoholic beverages while taking this medicine.     If you get dizzy, sit or lie down at the first signs. Be careful going up and down stairs.  Be extra  careful to prevent falls.     Never give this medicine to others.     Keep this medicine out of reach of children.     Do not take or save old medicines. Throw them away when outdated.     Keep all medicines in a cool, dry place. DO NOT keep them in your bathroom medicine cabinet or in a cabinet above the stove.    MEDICATION REFILLS  Please be aware that we cannot refill any prescriptions through the ER. If you need further treatment from what is provided at your ER visit, please follow up with your primary care doctor or your pain management specialist.    FREESTANDING EMERGENCY DEPARTMENTS OF [facility]  Did you know [facility] has two freestanding ERs located just a few miles away?  [facility] [place] and [facility] [place] have short wait times, easy free parking directly in front of the building and top patient satisfaction scores - and the same Board Certified Emergency Medicine doctors as [facility].

## 2016-10-10 ENCOUNTER — Emergency Department: Payer: BLUE CROSS/BLUE SHIELD

## 2016-10-10 ENCOUNTER — Other Ambulatory Visit: Payer: Self-pay

## 2016-10-10 ENCOUNTER — Emergency Department
Admission: EM | Admit: 2016-10-10 | Discharge: 2016-10-10 | Disposition: A | Discharge status: Home / Self Care | Payer: BLUE CROSS/BLUE SHIELD | Attending: Emergency Medicine | Admitting: Emergency Medicine

## 2016-10-10 DIAGNOSIS — F17210 Nicotine dependence, cigarettes, uncomplicated: Secondary | ICD-10-CM | POA: Insufficient documentation

## 2016-10-10 DIAGNOSIS — K219 Gastro-esophageal reflux disease without esophagitis: Secondary | ICD-10-CM | POA: Insufficient documentation

## 2016-10-10 DIAGNOSIS — Z79899 Other long term (current) drug therapy: Secondary | ICD-10-CM | POA: Insufficient documentation

## 2016-10-10 DIAGNOSIS — I10 Essential (primary) hypertension: Secondary | ICD-10-CM | POA: Insufficient documentation

## 2016-10-10 DIAGNOSIS — J069 Acute upper respiratory infection, unspecified: Secondary | ICD-10-CM | POA: Insufficient documentation

## 2016-10-10 LAB — CBC AND DIFFERENTIAL
Absolute NRBC: 0 10*3/uL
Basophils Absolute Automated: 0.02 10*3/uL (ref 0.00–0.20)
Basophils Automated: 0.5 %
Eosinophils Absolute Automated: 0.29 10*3/uL (ref 0.00–0.70)
Eosinophils Automated: 7.2 %
Hematocrit: 46.1 % (ref 42.0–52.0)
Hgb: 14.9 g/dL (ref 13.0–17.0)
Immature Granulocytes Absolute: 0.01 10*3/uL
Immature Granulocytes: 0.2 %
Lymphocytes Absolute Automated: 1.35 10*3/uL (ref 0.50–4.40)
Lymphocytes Automated: 33.5 %
MCH: 27.6 pg — ABNORMAL LOW (ref 28.0–32.0)
MCHC: 32.3 g/dL (ref 32.0–36.0)
MCV: 85.5 fL (ref 80.0–100.0)
MPV: 10.6 fL (ref 9.4–12.3)
Monocytes Absolute Automated: 0.77 10*3/uL (ref 0.00–1.20)
Monocytes: 19.1 %
Neutrophils Absolute: 1.59 10*3/uL — ABNORMAL LOW (ref 1.80–8.10)
Neutrophils: 39.5 %
Nucleated RBC: 0 /100 WBC (ref 0.0–1.0)
Platelets: 273 10*3/uL (ref 140–400)
RBC: 5.39 10*6/uL (ref 4.70–6.00)
RDW: 16 % — ABNORMAL HIGH (ref 12–15)
WBC: 4.03 10*3/uL (ref 3.50–10.80)

## 2016-10-10 LAB — COMPREHENSIVE METABOLIC PANEL
ALT: 28 U/L (ref 0–55)
AST (SGOT): 27 U/L (ref 5–34)
Albumin/Globulin Ratio: 1 (ref 0.9–2.2)
Albumin: 3.6 g/dL (ref 3.5–5.0)
Alkaline Phosphatase: 64 U/L (ref 38–106)
BUN: 12 mg/dL (ref 9.0–28.0)
Bilirubin, Total: 0.7 mg/dL (ref 0.2–1.2)
CO2: 25 mEq/L (ref 22–29)
Calcium: 9.1 mg/dL (ref 8.5–10.5)
Chloride: 102 mEq/L (ref 100–111)
Creatinine: 0.9 mg/dL (ref 0.7–1.3)
Globulin: 3.6 g/dL (ref 2.0–3.6)
Glucose: 92 mg/dL (ref 70–100)
Potassium: 4 mEq/L (ref 3.5–5.1)
Protein, Total: 7.2 g/dL (ref 6.0–8.3)
Sodium: 137 mEq/L (ref 136–145)

## 2016-10-10 LAB — LIPASE: Lipase: 8 U/L (ref 8–78)

## 2016-10-10 LAB — GFR: EGFR: >60

## 2016-10-10 MED ORDER — CYCLOBENZAPRINE HCL 5 MG PO TABS
5.0000 mg | ORAL_TABLET | Freq: Three times a day (TID) | ORAL | 0 refills | Status: AC | PRN
Start: 2016-10-10 — End: 2016-10-25

## 2016-10-10 MED ORDER — IPRATROPIUM BROMIDE 0.02 % IN SOLN
0.5000 mg | Freq: Once | RESPIRATORY_TRACT | Status: AC
Start: 2016-10-10 — End: 2016-10-10
  Administered 2016-10-10: 0.5 mg via RESPIRATORY_TRACT
  Filled 2016-10-10: qty 2.5

## 2016-10-10 MED ORDER — ALBUTEROL SULFATE HFA 108 (90 BASE) MCG/ACT IN AERS
2.0000 | INHALATION_SPRAY | RESPIRATORY_TRACT | 0 refills | Status: DC | PRN
Start: 2016-10-10 — End: 2016-11-09
  Filled 2016-10-10: qty 8.5, 18d supply, fill #0

## 2016-10-10 MED ORDER — ALBUTEROL SULFATE (2.5 MG/3ML) 0.083% IN NEBU
2.5000 mg | INHALATION_SOLUTION | Freq: Once | RESPIRATORY_TRACT | Status: AC
Start: 2016-10-10 — End: 2016-10-10
  Administered 2016-10-10: 2.5 mg via RESPIRATORY_TRACT
  Filled 2016-10-10: qty 3

## 2016-10-10 MED ORDER — CYCLOBENZAPRINE HCL 10 MG PO TABS
10.0000 mg | ORAL_TABLET | Freq: Three times a day (TID) | ORAL | 0 refills | Status: AC | PRN
Start: 2016-10-10 — End: 2016-10-25
  Filled 2016-10-10: qty 21, 7d supply, fill #0

## 2016-10-10 MED ORDER — NAPROXEN 500 MG PO TABS
500.0000 mg | ORAL_TABLET | Freq: Two times a day (BID) | ORAL | 0 refills | Status: AC
Start: 2016-10-10 — End: 2016-10-25
  Filled 2016-10-10: qty 30, 15d supply, fill #0

## 2016-10-10 NOTE — ED Provider Notes (Signed)
Attending Note:   The patient was seen by the resident or midlevel (Physician Assistant or Nurse Practitioner). I have also seen and examined the patient. The pertinent elements of my history, exam, assessment and plan are outlined below. I agree with assessment and care plan, and confirm the diagnosis(es).      HPI: 51YO, M with pertinent pmhx of HTN who p/w LUQ abdominal pain onset today.  Pt has had [name] cough for the past 4 days and this AM coughed and felt [name] pop to LUQ.  Has had abdominal pain since incident that worsens with movement and cough.  No fever, SOB, N/V/D.      PE: [name] obese male -alert, non-toxic, NAD  Lungs - somewhat coarse BS but no wheezing, rales.  Cor - RRR  Abd - very limited due to obesity - - no significant tenderness to palpation.    Note - suspect musculoskeletal pain due to coughing/ bronchitis - will give inhaler for home , supportive care.    I was acting as [name] Neurosurgeon for Maurine Minister, MD on [name]  I am the first provider for this patient and I personally performed the services documented. Dorathy Daft is scribing for me on [name],[name]. This note and the patient instructions accurately reflect work and decisions made by me.  Maurine Minister, MD         Maurine Minister, MD  10/13/16 [phone]

## 2016-10-10 NOTE — Discharge Instructions (Signed)
Dear [name]:    Thank you for choosing the [facility] Emergency Department, the premier emergency department in the [place] area.  I hope your visit today was EXCELLENT.    Specific instructions for your visit today:      Upper Respiratory Infection    You have been diagnosed with a viral upper respiratory infection, often called a "URI."    The symptoms of a viral upper respiratory infection may include fever (temperature higher than 100.3F / 38C), runny nose, congestion and sinus fullness, facial pain, earache, sore throat, cough, and occasionally wheezing. The symptoms usually improve within 3 to 4 days. It might take up to 10 days before you feel completely better.    URI's are treated with fluids, rest, and medication for fever and pain. Sometimes decongestants, cough medications or-medications for wheezing can also help.    Antibiotics have NO effect whatsoever on viruses and ARE NOT needed for a URI. Taking antibiotics when they are not necessary can cause side effects (like diarrhea or allergic reactions). It can also cause resistance, meaning antibiotics won't work when you need them in the future.    You do not need to follow up with a doctor unless you feel worse or have other concerns.    YOU SHOULD SEEK MEDICAL ATTENTION IMMEDIATELY, EITHER HERE OR AT THE NEAREST EMERGENCY DEPARTMENT, IF ANY OF THE FOLLOWING OCCURS:   You have new or worse symptoms or concerns.   You are short of breath.   You have a severe headache, stiff neck, confusion, or problems thinking.   You have nasal discharge, fever (temperature higher than 100.3F / 38C), or productive cough (one that brings up mucous from the lungs) lasting more that 10 days. Occasionally a viral cold may lead into a bacterial infection, such as a sinus infection, ear infection or pneumonia. Taking antibiotics during the cold will NOT prevent these infections, but if a secondary infection occurs, you might require additional  treatment.                 If you do not continue to improve or your condition worsens, please contact your doctor or return immediately to the Emergency Department.    Sincerely,  Maurine Minister, MD  Attending Emergency Physician  [facility] Emergency Department    ONSITE PHARMACY  Our full service onsite pharmacy is located in the ER waiting room.  Open 7 days a week from 9 am to 11 pm.  We accept all major insurances and prices are competitive with major retailers.  Ask your provider to print your prescriptions down to the pharmacy to speed you on your way home.    OBTAINING A PRIMARY CARE APPOINTMENT    Primary care physicians (PCPs, also known as primary care doctors) are either internists or family medicine doctors. Both types of PCPs focus on health promotion, disease prevention, patient education and counseling, and treatment of acute and chronic medical conditions.    Call for an appointment with a primary care doctor.  Ask to see who is taking new patients.     [facility]  telephone:  [phone]  [email or web address]    DOCTOR REFERRALS  Call [phone] (available 24 hours a day, 7 days a week) if you need any further referrals and we can help you find a primary care doctor or specialist.  Also, available online at:  [email or web address]    YOUR CONTACT INFORMATION  Before leaving please check with registration to make  sure we have an up-to-date contact number.  You can call registration at [phone] to update your information.  For questions about your hospital bill, please call [phone].  For questions about your Emergency Dept Physician bill please call [phone].      [place]  If you need help with health or social services, please call 2-1-1 for a free referral to resources in your area.  2-1-1 is a free service connecting people with information on health insurance, free clinics, pregnancy, mental health, dental care, food  assistance, housing, and substance abuse counseling.  Also, available online at:  http://www.[place].org    MEDICAL RECORDS AND TESTS  Certain laboratory test results do not come back the same day, for example urine cultures.   We will contact you if other important findings are noted.  Radiology films are often reviewed again to ensure accuracy.  If there is any discrepancy, we will notify you.      Please call [phone] to pick up a complimentary CD of any radiology studies performed.  If you or your doctor would like to request a copy of your medical records, please call [phone].      ORTHOPEDIC INJURY   Please know that significant injuries can exist even when an initial x-ray is read as normal or negative.  This can occur because some fractures (broken bones) are not initially visible on x-rays.  For this reason, close outpatient follow-up with your primary care doctor or bone specialist (orthopedist) is required.    MEDICATIONS AND FOLLOWUP  Please be aware that some prescription medications can cause drowsiness.  Use caution when driving or operating machinery.    The examination and treatment you have received in our Emergency Department is provided on an emergency basis, and is not intended to be a substitute for your primary care physician.  It is important that your doctor checks you again and that you report any new or remaining problems at that time.      [place]  The nearest 24 hour pharmacy is:    [facility] [place], [place] [address]  [place]  [facility])  Call to start or finish an application, compare plans, enroll or ask a question.  [place]: [phone]  Web:  Healthcare.gov    Help Enrolling in [place]  [phone] (TOLL-FREE)  [phone] (TTY)  Web:  Http://www.coverva.org    Local Help Enrolling in the [place]  ([phone] (MAIN)  Email:  health-help@nvfs .org  Web:  http://lewis-perez.info/  Address:  [address] [place], [place] [address]    SEDATING MEDICATIONS  Sedating medications include strong pain medications (e.g. narcotics), muscle relaxers, benzodiazepines (used for anxiety and as muscle relaxers), Benadryl/diphenhydramine and other antihistamines for allergic reactions/itching, and other medications.  If you are unsure if you have received a sedating medication, please ask your physician or nurse.  If you received a sedating medication: DO NOT drive a car. DO NOT operate machinery. DO NOT perform jobs where you need to be alert.  DO NOT drink alcoholic beverages while taking this medicine.     If you get dizzy, sit or lie down at the first signs. Be careful going up and down stairs.  Be extra careful to prevent falls.     Never give this medicine to others.  Keep this medicine out of reach of children.     Do not take or save old medicines. Throw them away when outdated.     Keep all medicines in a cool, dry place. DO NOT keep them in your bathroom medicine cabinet or in a cabinet above the stove.    MEDICATION REFILLS  Please be aware that we cannot refill any prescriptions through the ER. If you need further treatment from what is provided at your ER visit, please follow up with your primary care doctor or your pain management specialist.    [place]  Did you know [facility] has two freestanding ERs located just a few miles away?  [facility] [place] have short wait times, easy free parking directly in front of the building and top patient satisfaction scores - and the same Board Certified Emergency Medicine doctors as [facility].

## 2016-10-10 NOTE — ED Provider Notes (Signed)
[facility] EMERGENCY DEPARTMENT APP H&P         CLINICAL SUMMARY          Diagnosis:    .     Final diagnoses:   Upper respiratory infection, acute         MDM Notes:       51 y/o M w/ cough and URI sx, chest/abdomen pain w/ coughing. Chest x-ray negative for pneumonia or rib fracture.  Patient is well-appearing with normal vital signs.  His cough is improved after nebulizer.  Other labs are within normal limits.  Patient is okay for discharge home.  Follow up with PCP outpatient.  Use albuterol inhaler every 3-4 hours as needed.  Motrin and Tylenol for pain.  Return to ED for worsening symptoms or complaints.      Disposition:         Discharge         Discharge Prescriptions     Medication Sig Dispense Auth. Provider    albuterol (PROVENTIL HFA;VENTOLIN HFA) 108 (90 Base) MCG/ACT inhaler Inhale 2 puffs into the lungs every 4 (four) hours as needed for Wheezing or Shortness of Breath (coughing).Dispense with spacer 8.5 g [name], PA    cyclobenzaprine (FLEXERIL) 5 MG tablet Take 1 tablet (5 mg total) by mouth 3 (three) times daily as needed (15).for up to 15 days 12 tablet Niccolo Burggraf, Ardeth Perfect, PA    naproxen (NAPROSYN) 500 MG tablet Take 1 tablet (500 mg total) by mouth 2 (two) times daily with meals.for 30 doses 30 tablet Kasra Melvin, Ardeth Perfect, PA    cyclobenzaprine (FLEXERIL) 10 MG tablet Take 1 tablet (10 mg total) by mouth 3 (three) times daily as needed for Muscle spasms.for up to 15 days 21 tablet Maurine Minister, MD                  CLINICAL INFORMATION        HPI:      Chief Complaint: Cough  .      Context: home  Location: respiratory  Duration: 1 week  Quality: dry cough, now L sided CP/abd pain after coughing only  Timing: intermittent  Maximum Severity: mild  Modifying Factors: pain also w/ movement, improved w/ motrin    [name] is a 51 y.o. male  has a past medical history of GERD (gastroesophageal reflux disease) and Hypertension. who presents with cough and  nasal congestion x1 week. Today he was coughing when he felt a "pop" in his L lower rib/L upper abdomen and has had pain since then w/ movement or coughing. No fever, SOB, N/V/D. No other complaints.     History obtained from: Patient          ROS:      Positive and negative ROS elements as per HPI.  All other systems reviewed and negative.      Physical Exam:      Pulse (!) 104  BP 153/88  Resp 20  SpO2 98 %  Temp 98.6 F (37 C)    Physical Exam   Constitutional: He is oriented to person, place, and time. He appears well-developed and well-nourished. No distress.   Morbidly obese   HENT:   Head: Normocephalic and atraumatic.   Eyes: Conjunctivae and EOM are normal. Pupils are equal, round, and reactive to light.   Cardiovascular: Normal rate, regular rhythm, normal heart sounds and intact distal pulses.    Pulmonary/Chest: Effort normal and breath sounds normal. No  respiratory distress. He has no wheezes. He exhibits tenderness (mild L anterior rib tenderness, no crepitus or deformity).   Abdominal: Soft. Bowel sounds are normal. He exhibits no distension and no mass. There is no tenderness.   Lymphadenopathy:     He has no cervical adenopathy.   Neurological: He is alert and oriented to person, place, and time.   Skin: Skin is warm and dry. He is not diaphoretic.   Nursing note and vitals reviewed.              PAST HISTORY        Primary Care Provider: [name], MD        PMH/PSH:    .     Past Medical History:   Diagnosis Date   . GERD (gastroesophageal reflux disease)    . Hypertension        He has no past surgical history on file.      Social/Family History:      He reports that he has been smoking Cigarettes.  He uses smokeless tobacco. He reports that he drinks alcohol. He reports that he does not use drugs.    History reviewed. No pertinent family history.      Listed Medications on Arrival:    .     Discharge Medication List as of 10/10/2016  3:18 PM      CONTINUE these medications which have  NOT CHANGED    Details   amlodipine-benazepril (LOTREL) 10-20 MG per capsule Take 1 capsule by mouth daily., Until Discontinued, Historical Med      benzonatate (TESSALON) 100 MG capsule Take 1 capsule (100 mg total) by mouth 3 (three) times daily as needed for Cough., Starting 06/03/2014, Until Discontinued, Print      cloNIDine (CATAPRES) 0.3 MG tablet Take 0.3 mg by mouth 2 (two) times daily., Until Discontinued, Historical Med      hydrochlorothiazide (HYDRODIURIL) 25 MG tablet Take 25 mg by mouth daily., Until Discontinued, Historical Med      omeprazole (PRILOSEC) 40 MG capsule Take 40 mg by mouth daily., Until Discontinued, Historical Med      cyclobenzaprine (FLEXERIL) 10 MG tablet Take 1 tablet (10 mg total) by mouth 3 (three) times daily as needed for Muscle spasms.for up to 15 doses, Starting Wed 09/26/2016, Print            Allergies: He has No Known Allergies.            VISIT INFORMATION        Clinical Course in the ED:      Counseling: I have spoke with the patient/family and discussed today's findings, in addition to providing specific details for the plan of care. Questions are answered and there is agreement with the plan. Return precautions discussed.      Medications Given in the ED:    .     ED Medication Orders     Start Ordered     Status Ordering Provider    10/10/16 1423 10/10/16 1422  albuterol (PROVENTIL) nebulizer solution 2.5 mg  RT - Once     Route: Nebulization  Ordered Dose: 2.5 mg     Last MAR action:  Given [name]    10/10/16 1423 10/10/16 1422  ipratropium (ATROVENT) 0.02 % nebulizer solution 0.5 mg  RT - Once     Route: Nebulization  Ordered Dose: 0.5 mg     Last MAR action:  Given [name]  Procedures:      Procedures      Interpretations:      Differential Diagnosis (not completely inclusive): High risk differentials considered, and include pleuritis, URI, pneumonia, viral syndrome, asthma/COPD exacerbation, respiratory failure, sepsis,  pulmonary edema    EKG:           Rhythm Strip Interpretation / Cardiac Monitor Analysis:           Pulse Ox Analysis: saturation: 98 %; Oxygen use: room air; Interpretation: Normal      Critical Care Time:     Radiology -  interpreted by me with the following observations: CXR    Smoking Cessation - Patient counseled on smoking cessation                RESULTS        Lab Results:      Results     Procedure Component Value Units Date/Time    Comprehensive metabolic panel [[id number]] Collected:  10/10/16 1441    Specimen:  Blood Updated:  10/10/16 1511     Glucose 92 mg/dL      BUN 52.8 mg/dL      Creatinine 0.9 mg/dL      Sodium 413 mEq/L      Potassium 4.0 mEq/L      Chloride 102 mEq/L      CO2 25 mEq/L      Calcium 9.1 mg/dL      Protein, Total 7.2 g/dL      Albumin 3.6 g/dL      AST (SGOT) 27 U/L      ALT 28 U/L      Alkaline Phosphatase 64 U/L      Bilirubin, Total 0.7 mg/dL      Globulin 3.6 g/dL      Albumin/Globulin Ratio 1.0    Lipase [[id number]] Collected:  10/10/16 1441    Specimen:  Blood Updated:  10/10/16 1511     Lipase 8 U/L     GFR [[id number]] Collected:  10/10/16 1441     Updated:  10/10/16 1511     EGFR >60.0    CBC with differential [[id number]]  (Abnormal) Collected:  10/10/16 1441    Specimen:  Blood from Blood Updated:  10/10/16 1456     WBC 4.03 x10 3/uL      Hgb 14.9 g/dL      Hematocrit 75.6 %      Platelets 273 x10 3/uL      RBC 5.39 x10 6/uL      MCV 85.5 fL      MCH 27.6 (L) pg      MCHC 32.3 g/dL      RDW 16 (H) %      MPV 10.6 fL      Neutrophils 39.5 %      Lymphocytes Automated 33.5 %      Monocytes 19.1 %      Eosinophils Automated 7.2 %      Basophils Automated 0.5 %      Immature Granulocyte 0.2 %      Nucleated RBC 0.0 /100 WBC      Neutrophils Absolute 1.59 (L) x10 3/uL      Abs Lymph Automated 1.35 x10 3/uL      Abs Mono Automated 0.77 x10 3/uL      Abs Eos Automated 0.29 x10 3/uL      Absolute Baso Automated 0.02 x10 3/uL      Absolute Immature Granulocyte 0.01 x10 3/uL  Absolute NRBC 0.00 x10 3/uL               Radiology Results:      XR Chest 2 Views   Final Result      No active disease noted in the chest. No interval change.      Colonel Bald, MD    10/10/2016 2:24 PM                  Scribe Attestation:                [name], [place]  10/10/16 [phone]

## 2016-11-21 ENCOUNTER — Emergency Department: Payer: BLUE CROSS/BLUE SHIELD

## 2016-11-21 ENCOUNTER — Emergency Department
Admission: EM | Admit: 2016-11-21 | Discharge: 2016-11-21 | Disposition: A | Discharge status: Home / Self Care | Payer: BLUE CROSS/BLUE SHIELD | Attending: Emergency Medicine | Admitting: Emergency Medicine

## 2016-11-21 DIAGNOSIS — R079 Chest pain, unspecified: Secondary | ICD-10-CM | POA: Insufficient documentation

## 2016-11-21 DIAGNOSIS — F17210 Nicotine dependence, cigarettes, uncomplicated: Secondary | ICD-10-CM | POA: Insufficient documentation

## 2016-11-21 DIAGNOSIS — Z79899 Other long term (current) drug therapy: Secondary | ICD-10-CM | POA: Insufficient documentation

## 2016-11-21 DIAGNOSIS — I872 Venous insufficiency (chronic) (peripheral): Secondary | ICD-10-CM

## 2016-11-21 DIAGNOSIS — R600 Localized edema: Secondary | ICD-10-CM | POA: Insufficient documentation

## 2016-11-21 DIAGNOSIS — R05 Cough: Secondary | ICD-10-CM | POA: Insufficient documentation

## 2016-11-21 DIAGNOSIS — K219 Gastro-esophageal reflux disease without esophagitis: Secondary | ICD-10-CM | POA: Insufficient documentation

## 2016-11-21 DIAGNOSIS — M25551 Pain in right hip: Secondary | ICD-10-CM | POA: Insufficient documentation

## 2016-11-21 DIAGNOSIS — I878 Other specified disorders of veins: Secondary | ICD-10-CM | POA: Insufficient documentation

## 2016-11-21 DIAGNOSIS — I10 Essential (primary) hypertension: Secondary | ICD-10-CM | POA: Insufficient documentation

## 2016-11-21 LAB — ECG 12-LEAD
Atrial Rate: 88 {beats}/min
P Axis: 50 degrees
P-R Interval: 180 ms
Q-T Interval: 362 ms
QRS Duration: 94 ms
QTC Calculation (Bezet): 438 ms
R Axis: 29 degrees
T Axis: 16 degrees
Ventricular Rate: 88 {beats}/min

## 2016-11-21 LAB — I-STAT TROPONIN: i-STAT Troponin: 0 ng/mL (ref 0.00–0.09)

## 2016-11-21 LAB — CBC AND DIFFERENTIAL
Absolute NRBC: 0 10*3/uL
Basophils Absolute Automated: 0.03 10*3/uL (ref 0.00–0.20)
Basophils Automated: 0.5 %
Eosinophils Absolute Automated: 0.12 10*3/uL (ref 0.00–0.70)
Eosinophils Automated: 1.9 %
Hematocrit: 43.4 % (ref 42.0–52.0)
Hgb: 13.8 g/dL (ref 13.0–17.0)
Immature Granulocytes Absolute: 0.02 10*3/uL
Immature Granulocytes: 0.3 %
Lymphocytes Absolute Automated: 1.51 10*3/uL (ref 0.50–4.40)
Lymphocytes Automated: 23.3 %
MCH: 27.6 pg — ABNORMAL LOW (ref 28.0–32.0)
MCHC: 31.8 g/dL — ABNORMAL LOW (ref 32.0–36.0)
MCV: 86.8 fL (ref 80.0–100.0)
MPV: 10.4 fL (ref 9.4–12.3)
Monocytes Absolute Automated: 0.81 10*3/uL (ref 0.00–1.20)
Monocytes: 12.5 %
Neutrophils Absolute: 3.99 10*3/uL (ref 1.80–8.10)
Neutrophils: 61.5 %
Nucleated RBC: 0 /100 WBC (ref 0.0–1.0)
Platelets: 294 10*3/uL (ref 140–400)
RBC: 5 10*6/uL (ref 4.70–6.00)
RDW: 16 % — ABNORMAL HIGH (ref 12–15)
WBC: 6.48 10*3/uL (ref 3.50–10.80)

## 2016-11-21 LAB — HEPATIC FUNCTION PANEL
ALT: 20 U/L (ref 0–55)
AST (SGOT): 16 U/L (ref 5–34)
Albumin/Globulin Ratio: 1 (ref 0.9–2.2)
Albumin: 3.3 g/dL — ABNORMAL LOW (ref 3.5–5.0)
Alkaline Phosphatase: 67 U/L (ref 38–106)
Bilirubin Direct: 0.1 mg/dL (ref 0.0–0.5)
Bilirubin Indirect: 0.3 mg/dL (ref 0.0–1.1)
Bilirubin, Total: 0.4 mg/dL (ref 0.2–1.2)
Globulin: 3.3 g/dL (ref 2.0–3.6)
Protein, Total: 6.6 g/dL (ref 6.0–8.3)

## 2016-11-21 LAB — BASIC METABOLIC PANEL
BUN: 16 mg/dL (ref 9.0–28.0)
CO2: 27 mEq/L (ref 22–29)
Calcium: 9.2 mg/dL (ref 8.5–10.5)
Chloride: 106 mEq/L (ref 100–111)
Creatinine: 0.9 mg/dL (ref 0.7–1.3)
Glucose: 91 mg/dL (ref 70–100)
Potassium: 4.7 mEq/L (ref 3.5–5.1)
Sodium: 141 mEq/L (ref 136–145)

## 2016-11-21 LAB — PT AND APTT
PT INR: 1.1 (ref 0.9–1.1)
PT: 14.4 s (ref 12.6–15.0)
PTT: 40 s — ABNORMAL HIGH (ref 23–37)

## 2016-11-21 LAB — GFR: EGFR: >60

## 2016-11-21 LAB — LIPASE: Lipase: 20 U/L (ref 8–78)

## 2016-11-21 NOTE — ED Provider Notes (Signed)
[facility] EMERGENCY DEPARTMENT H&P      Visit date: 11/21/2016      CLINICAL SUMMARY          Diagnosis:    .     Final diagnoses:   None         MDM Notes:      CHest pain - lasting [name] second or two  - no persistent pain - less likely ACS.  Cough improving - doubt pneumonia.  Consider CHF. Morbid obesity with chronic edema - rule out DVT.  Consider PE but no SOB.  Check labs, dopplers, observe.      Disposition:      discharge               CLINICAL INFORMATION        HPI:      Chief Complaint: Chest Pain and Leg Pain  .    [name] is [name] 51 y.o. male with [name] PMHx significant for HTN who presents with left leg pain/swelling behind his knee [name] cracking of the skin of the lower left leg. Also has had intermittent chest pain episodes, lasting 1-2 seconds each, over the past 2 days, mostly at night. Also notes some right hip pain. Denies nausea, diaphoresis, SOB, cough, fever. No cardiac history, no history of blood clots.     History obtained from: Patient          ROS:      Positive and negative ROS elements as per HPI.  All other systems reviewed and negative.      Physical Exam:      Pulse (!) 110  BP 138/72  Resp 18  SpO2 100 %  Temp 97.8 F (36.6 C)    Physical Exam   Constitutional: He is oriented to person, place, and time. He appears well-developed and well-nourished.   Morbidly obese male - alert, NAD   HENT:   Head: Normocephalic and atraumatic.   Eyes: Pupils are equal, round, and reactive to light.   Neck: Normal range of motion. Neck supple. No JVD present. No tracheal deviation present. No thyromegaly present.   Cardiovascular: Normal rate, regular rhythm, normal heart sounds and intact distal pulses.  Exam reveals no gallop and no friction rub.    No murmur heard.  Pulmonary/Chest: Effort normal and breath sounds normal. No stridor. No respiratory distress. He has no wheezes. He has no rales. He exhibits no tenderness.   Clear bilat.   Abdominal: Soft. Bowel sounds are normal. He exhibits  no distension and no mass. There is no tenderness. There is no rebound and no guarding.   Musculoskeletal: Normal range of motion. He exhibits edema. He exhibits no tenderness.   Chronic stasis changes both legs with 1 plus  Edema.   Lymphadenopathy:     He has no cervical adenopathy.   Neurological: He is alert and oriented to person, place, and time. No cranial nerve deficit. He exhibits normal muscle tone. Coordination normal.   Skin: Skin is warm. No rash noted. No erythema. No pallor.   Psychiatric: He has [name] normal mood and affect. His behavior is normal. Judgment and thought content normal.   Nursing note and vitals reviewed.                   PAST HISTORY        Primary Care Provider: [name], MD        PMH/PSH:    .  Past Medical History:   Diagnosis Date   . GERD (gastroesophageal reflux disease)    . Hypertension        He has no past surgical history on file.      Social/Family History:      He reports that he has been smoking Cigarettes.  He uses smokeless tobacco. He reports that he drinks alcohol. He reports that he does not use drugs.    No family history on file.      Listed Medications on Arrival:    .     Home Medications             amlodipine-benazepril (LOTREL) 10-20 MG per capsule     Take 1 capsule by mouth daily.     benzonatate (TESSALON) 100 MG capsule     Take 1 capsule (100 mg total) by mouth 3 (three) times daily as needed for Cough.     cloNIDine (CATAPRES) 0.3 MG tablet     Take 0.3 mg by mouth 2 (two) times daily.     hydrochlorothiazide (HYDRODIURIL) 25 MG tablet     Take 25 mg by mouth daily.     omeprazole (PRILOSEC) 40 MG capsule     Take 40 mg by mouth daily.         Allergies: He has No Known Allergies.            VISIT INFORMATION        Clinical Course in the ED:      ED Course          Medications Given in the ED:    .     ED Medication Orders     None            Procedures:      Procedures      Interpretations:       O2 sat-           saturation: 100 %; Oxygen use:  room air; Interpretation: Normal       EKG -             interpreted by me: Normal sinus 88. No acute changes.            RESULTS        Lab Results:      Results     Procedure Component Value Units Date/Time    Basic Metabolic Panel [[id number]] Collected:  11/21/16 1316    Specimen:  Blood Updated:  11/21/16 1316    Hepatic function panel (LFT) [[id number]] Collected:  11/21/16 1316    Specimen:  Blood Updated:  11/21/16 1316    Lipase [[id number]] Collected:  11/21/16 1316    Specimen:  Blood Updated:  11/21/16 1316    CBC with differential [[id number]] Collected:  11/21/16 1316    Specimen:  Blood from Blood Updated:  11/21/16 1316    PT/APTT [[id number]] Collected:  11/21/16 1316     Updated:  11/21/16 1316              Radiology Results:      XR Chest 2 Views    (Results Pending)            NOTE - CXR negative, labs unremarkable - I do not suspect PE or cardiac ischemia at this   Time - stable for discharge.       Scribe Attestation:      I was acting as [name] Neurosurgeon for Maurine Minister, MD on  [name]  Pablo Ledger     I am the first provider for this patient and I personally performed the services documented. Pablo Ledger is scribing for me on [name]. This note and the patient instructions accurately reflect work and decisions made by me.  Maurine Minister, MD          Maurine Minister, MD  11/25/16 [phone]

## 2016-11-21 NOTE — Discharge Instructions (Signed)
Dear [name]:    Thank you for choosing the [facility] Emergency Department, the premier emergency department in the [place] area.  I hope your visit today was EXCELLENT.    Specific instructions for your visit today:      DVT LE Ruled Out    You were seen for your leg symptoms.    This can include redness, swelling and/or pain. In these cases, the doctor is often worried that you had a blood clot in your leg. This is also called a deep venous thrombosis (DVT).    The diagnosis is often from an ultrasound of the leg. This is also called a duplex Doppler ultrasound. This is a non-invasive (on the outside of your body) test. It can detect a blood clot in the veins. An ultrasound specialist does the test. A radiologist or vascular (blood vessel) specialist looks at it. The test uses sound waves to look at the veins. It can help decide if they have blood flowing in them or if they are clotted. The test is very good at finding blood clots in the upper leg (thigh). It can also find clots in the lower leg (calf). However, the test is not as good in this part of the leg. The smaller the clot, the harder it can be to find. In these cases, the ultrasound can be re-done in a few days. The doctor will decide if this is needed.    The ultrasound of your leg was normal. It did not show any blood clots.    The exact cause of your problem is still not known. We don't know the exact cause of your symptoms. However, your doctor thinks it's safe for you to go home. You may need to see your doctor or referral doctor for more tests.    Medical problems can be very complex and some conditions can be very difficult to figure out, even with advanced medical testing. It is important to understand that even after an Emergency Department (or Urgent Care) visit or an observation stay, you may still have a serious medical problem.    YOU SHOULD SEEK MEDICAL ATTENTION IMMEDIATELY, EITHER HERE OR AT THE NEAREST EMERGENCY  DEPARTMENT, IF ANY OF THE FOLLOWING OCCURS:   Your leg swelling gets worse.   Your legs get red and you have pain / fever (temperature higher than 100.25F / 38C).   You have any chest pain or tightness or pressure over your heart.   You are short of breath, especially if there is pain in your chest when you breathe in.   Your leg feels numb (no feeling) or cool.   Fainting.   Your symptoms or problems get worse.      LE Edema Etiology Unknown    You have been seen today for swelling of your leg(s).    Despite the doctor's evaluation, the cause of the swelling is unknown.    There are many possible causes for leg swelling. Below is a description of some of these causes:   Deep Venous Thrombus (DVT): This is a blood clot in one of the deep veins of your leg. Symptoms can include leg pain and swelling. The diagnosis is often made with a leg ultrasound which can detect a blockage in the veins.   Congestive heart failure is a condition where some fluid builds up in the lungs because of a heart problem. The main symptom is shortness of breath which worsens when you lie down. You may  also notice leg swelling and weight gain.   Cellulitis: This is a bacterial infection of the skin. Symptoms are usually redness, swelling, and warmth in the affected area. Some people get a fever (temperature higher than 100.63F / 38C) with this infection.   Venous Stasis: This happens when blood pools in the legs for some time. The legs get swollen and sometimes get red. The swelling gets better at night because lying flat makes it easier for blood to return to the heart. During the day as a person stands or sits with their legs dangling down, the blood has trouble getting out of the legs again and swelling gets worse.   Low amounts of Albumin: Albumin is a type of protein that is carried in the blood stream. One thing it does is keep the fluid part of the blood from leaking out of the blood vessels and into the surrounding  tissues. Low albumin can be from poor nutrition, alcoholism, liver disease or other chronic (ongoing) illnesses.    You had a duplex-Doppler ultrasound of the leg to look for a blood clot. No blood clot was seen. This test is not perfect, but it does a very good job finding blood clots. Sometimes, a doctor may want the ultrasound repeated in about a week to recheck the results.    The exact cause of the swelling is not known even though tests may have been done here today. Even though the cause is not known, the doctor feels that it is OK for you to go home. You need to see your doctor or referral doctor for more tests.    YOU SHOULD SEEK MEDICAL ATTENTION IMMEDIATELY, EITHER HERE OR AT THE NEAREST EMERGENCY DEPARTMENT, IF ANY OF THE FOLLOWING OCCURS:   Your leg swelling gets worse.   Your legs get red and you have pain / fever (temperature higher than 100.63F / 38C).   You develop any shortness of breath, chest pain or pressure over your heart.   You develop any shortness of breath, especially if there is pain in your chest when you breathe in.       Chest Pain of Unclear Etiology    You have been seen for chest pain. The cause of your pain is not yet known.    Your doctor has learned about your medical history, examined you, and checked any tests that were done. Still, it is unclear why you are having pain. The doctor thinks there is only a very small chance that your pain is caused by a life-threatening condition. Later, your primary care doctor might do more tests or check you again.    Sometimes chest pain is caused by a dangerous condition, like a heart attack, aorta injury, blood clot in the lung, or collapsed lung. It is unlikely that your pain is caused by a life-threatening condition if: Your chest pain lasts only a few seconds at a time; you are not short of breath, nauseated (sick to your stomach), sweaty, or lightheaded; your pain gets worse when you twist or bend; your pain improves with  exercise or hard work.    Chest pain is serious. It is VERY IMPORTANT that you follow up with your regular doctor and seek medical attention immediately here or at the nearest Emergency Department if your symptoms become worse or they change.    YOU SHOULD SEEK MEDICAL ATTENTION IMMEDIATELY, EITHER HERE OR AT THE NEAREST EMERGENCY DEPARTMENT, IF ANY OF THE FOLLOWING OCCURS:   Your pain gets  worse.   Your pain makes you short of breath, nauseated, or sweaty.   Your pain gets worse when you walk, go up stairs, or exert yourself.   You feel weak, lightheaded, or faint.   It hurts to breathe.   Your leg swells.   Your symptoms get worse or you have new symptoms or concerns.      If you do not continue to improve or your condition worsens, please contact your doctor or return immediately to the Emergency Department.    Sincerely,  Maurine Minister, MD  Attending Emergency Physician  [facility] Emergency Department    ONSITE PHARMACY  Our full service onsite pharmacy is located in the ER waiting room.  Open 7 days a week from 9 am to 11 pm.  We accept all major insurances and prices are competitive with major retailers.  Ask your provider to print your prescriptions down to the pharmacy to speed you on your way home.    OBTAINING A PRIMARY CARE APPOINTMENT    Primary care physicians (PCPs, also known as primary care doctors) are either internists or family medicine doctors. Both types of PCPs focus on health promotion, disease prevention, patient education and counseling, and treatment of acute and chronic medical conditions.    Call for an appointment with a primary care doctor.  Ask to see who is taking new patients.     [facility]  telephone:  [phone]  [email or web address]    DOCTOR REFERRALS  Call [phone] (available 24 hours a day, 7 days a week) if you need any further referrals and we can help you find a primary care doctor or specialist.  Also, available online at:   [email or web address]    YOUR CONTACT INFORMATION  Before leaving please check with registration to make sure we have an up-to-date contact number.  You can call registration at [phone] to update your information.  For questions about your hospital bill, please call [phone].  For questions about your Emergency Dept Physician bill please call [phone].      FREE HEALTH SERVICES  If you need help with health or social services, please call 2-1-1 for a free referral to resources in your area.  2-1-1 is a free service connecting people with information on health insurance, free clinics, pregnancy, mental health, dental care, food assistance, housing, and substance abuse counseling.  Also, available online at:  http://www.[facility].org    MEDICAL RECORDS AND TESTS  Certain laboratory test results do not come back the same day, for example urine cultures.   We will contact you if other important findings are noted.  Radiology films are often reviewed again to ensure accuracy.  If there is any discrepancy, we will notify you.      Please call [phone] to pick up a complimentary CD of any radiology studies performed.  If you or your doctor would like to request a copy of your medical records, please call [phone].      ORTHOPEDIC INJURY   Please know that significant injuries can exist even when an initial x-ray is read as normal or negative.  This can occur because some fractures (broken bones) are not initially visible on x-rays.  For this reason, close outpatient follow-up with your primary care doctor or bone specialist (orthopedist) is required.    MEDICATIONS AND FOLLOWUP  Please be aware that some prescription medications can cause drowsiness.  Use caution when driving or operating machinery.    The examination and treatment you  have received in our Emergency Department is provided on an emergency basis, and is not intended to be a substitute for your primary care physician.   It is important that your doctor checks you again and that you report any new or remaining problems at that time.      [place]  The nearest 24 hour pharmacy is:    [facility] [place], [place] [address]  [place]  [facility])  Call to start or finish an application, compare plans, enroll or ask a question.  [place]: [phone]  Web:  Healthcare.gov    Help Enrolling in [place]  561-691-4316 (TOLL-FREE)  224-610-5386 (TTY)  Web:  Http://www.coverva.org    Local Help Enrolling in the [place]  [phone] (MAIN)  Email:  health-help_0 .org  Web:  http://lewis-perez.info/  Address:  [address] [place], [place] [address]    SEDATING MEDICATIONS  Sedating medications include strong pain medications (e.g. narcotics), muscle relaxers, benzodiazepines (used for anxiety and as muscle relaxers), Benadryl/diphenhydramine and other antihistamines for allergic reactions/itching, and other medications.  If you are unsure if you have received a sedating medication, please ask your physician or nurse.  If you received a sedating medication: DO NOT drive a car. DO NOT operate machinery. DO NOT perform jobs where you need to be alert.  DO NOT drink alcoholic beverages while taking this medicine.     If you get dizzy, sit or lie down at the first signs. Be careful going up and down stairs.  Be extra careful to prevent falls.     Never give this medicine to others.     Keep this medicine out of reach of children.     Do not take or save old medicines. Throw them away when outdated.     Keep all medicines in a cool, dry place. DO NOT keep them in your bathroom medicine cabinet or in a cabinet above the stove.    MEDICATION REFILLS  Please be aware that we cannot refill any prescriptions through the ER. If you need further treatment from what is provided at your  ER visit, please follow up with your primary care doctor or your pain management specialist.    [place]  Did you know [facility] has two freestanding ERs located just a few miles away?  [facility] [place] have short wait times, easy free parking directly in front of the building and top patient satisfaction scores - and the same Board Certified Emergency Medicine doctors as [facility].

## 2016-11-21 NOTE — ED Notes (Signed)
I have briefly evaluated this patient as triage physician in order to facilitate and initiate the ordering of laboratory and imaging studies as needed.  Substernal cp today, h/o HTN and GERD       Alcide Evener, MD  11/21/16 1253

## 2016-11-29 ENCOUNTER — Ambulatory Visit: Payer: BLUE CROSS/BLUE SHIELD

## 2016-11-29 NOTE — Progress Notes (Signed)
11/29/16 1200   Pain History   Pain Symptoms Pain   Pain Location Lower Back;Hip  Right;Buttock  Right;Leg  Right;Foot/Toe  Right   Pain Description Dull/Aching;Spasms/Cramping;Radiating;Chronic(>3 months)   Pain Frequency Intermittent   Sensation Symptoms Numbness;Tingling   Sensation Location Buttock  Right;Leg  Right;Foot/Toe  Right   Sensation Description Burning   Sensation Frequency Intermittent   Foot Drop No   Incontinence No   Treatments   Have you visited a chiropractor for this problem? No   Have you ever had physical therapy for this problem? No   Have you ever received an injection for this problem? No   Care Management   How did you hear about our program ED/UCC   ISI Appointment   ISI Physician Margaretmary Dys   ISI Appointment Date 12/05/16  (10:30 AM )   [facility] Appointment Location [facility]

## 2016-12-24 ENCOUNTER — Ambulatory Visit: Admission: RE | Admit: 2016-12-24 | Payer: BLUE CROSS/BLUE SHIELD | Source: Ambulatory Visit

## 2017-01-03 ENCOUNTER — Ambulatory Visit: Admission: RE | Admit: 2017-01-03 | Payer: BLUE CROSS/BLUE SHIELD | Source: Ambulatory Visit

## 2017-05-09 ENCOUNTER — Emergency Department
Admission: EM | Admit: 2017-05-09 | Discharge: 2017-05-09 | Disposition: A | Discharge status: Home / Self Care | Payer: BLUE CROSS/BLUE SHIELD | Attending: Emergency Medicine | Admitting: Emergency Medicine

## 2017-05-09 ENCOUNTER — Other Ambulatory Visit: Payer: Self-pay

## 2017-05-09 DIAGNOSIS — I10 Essential (primary) hypertension: Secondary | ICD-10-CM | POA: Insufficient documentation

## 2017-05-09 DIAGNOSIS — N12 Tubulo-interstitial nephritis, not specified as acute or chronic: Secondary | ICD-10-CM | POA: Insufficient documentation

## 2017-05-09 DIAGNOSIS — F17210 Nicotine dependence, cigarettes, uncomplicated: Secondary | ICD-10-CM | POA: Insufficient documentation

## 2017-05-09 DIAGNOSIS — Z79899 Other long term (current) drug therapy: Secondary | ICD-10-CM | POA: Insufficient documentation

## 2017-05-09 DIAGNOSIS — K219 Gastro-esophageal reflux disease without esophagitis: Secondary | ICD-10-CM | POA: Insufficient documentation

## 2017-05-09 LAB — CBC AND DIFFERENTIAL
Absolute NRBC: 0 10*3/uL
Basophils Absolute Automated: 0.03 10*3/uL (ref 0.00–0.20)
Basophils Automated: 0.5 %
Eosinophils Absolute Automated: 0.06 10*3/uL (ref 0.00–0.70)
Eosinophils Automated: 1.1 %
Hematocrit: 44.7 % (ref 42.0–52.0)
Hgb: 14.5 g/dL (ref 13.0–17.0)
Immature Granulocytes Absolute: 0.01 10*3/uL
Immature Granulocytes: 0.2 %
Lymphocytes Absolute Automated: 1.23 10*3/uL (ref 0.50–4.40)
Lymphocytes Automated: 22.4 %
MCH: 27.8 pg — ABNORMAL LOW (ref 28.0–32.0)
MCHC: 32.4 g/dL (ref 32.0–36.0)
MCV: 85.6 fL (ref 80.0–100.0)
MPV: 10.6 fL (ref 9.4–12.3)
Monocytes Absolute Automated: 0.56 10*3/uL (ref 0.00–1.20)
Monocytes: 10.2 %
Neutrophils Absolute: 3.61 10*3/uL (ref 1.80–8.10)
Neutrophils: 65.6 %
Nucleated RBC: 0 /100 WBC (ref 0.0–1.0)
Platelets: 296 10*3/uL (ref 140–400)
RBC: 5.22 10*6/uL (ref 4.70–6.00)
RDW: 17 % — ABNORMAL HIGH (ref 12–15)
WBC: 5.5 10*3/uL (ref 3.50–10.80)

## 2017-05-09 LAB — URINALYSIS, REFLEX TO MICROSCOPIC EXAM IF INDICATED
Bilirubin, UA: NEGATIVE
Glucose, UA: NEGATIVE
Ketones UA: NEGATIVE
Nitrite, UA: NEGATIVE
Protein, UR: 30 — AB
Specific Gravity UA: 1.019 (ref 1.001–1.035)
Urine pH: 6 (ref 5.0–8.0)
Urobilinogen, UA: NORMAL mg/dL

## 2017-05-09 LAB — COMPREHENSIVE METABOLIC PANEL
ALT: 20 U/L (ref 0–55)
AST (SGOT): 17 U/L (ref 5–34)
Albumin/Globulin Ratio: 1 (ref 0.9–2.2)
Albumin: 3.8 g/dL (ref 3.5–5.0)
Alkaline Phosphatase: 69 U/L (ref 38–106)
BUN: 13 mg/dL (ref 9.0–28.0)
Bilirubin, Total: 0.7 mg/dL (ref 0.2–1.2)
CO2: 26 mEq/L (ref 22–29)
Calcium: 9.6 mg/dL (ref 8.5–10.5)
Chloride: 105 mEq/L (ref 100–111)
Creatinine: 0.9 mg/dL (ref 0.7–1.3)
Globulin: 3.8 g/dL — ABNORMAL HIGH (ref 2.0–3.6)
Glucose: 89 mg/dL (ref 70–100)
Potassium: 3.9 mEq/L (ref 3.5–5.1)
Protein, Total: 7.6 g/dL (ref 6.0–8.3)
Sodium: 140 mEq/L (ref 136–145)

## 2017-05-09 LAB — GFR: EGFR: >60

## 2017-05-09 LAB — LIPASE: Lipase: 9 U/L (ref 8–78)

## 2017-05-09 MED ORDER — CEFDINIR 300 MG PO CAPS
300.0000 mg | ORAL_CAPSULE | Freq: Two times a day (BID) | ORAL | 0 refills | Status: AC
Start: 2017-05-09 — End: 2017-05-19
  Filled 2017-05-09: qty 20, 10d supply, fill #0

## 2017-05-09 MED ORDER — SODIUM CHLORIDE 0.9 % IV MBP
1.0000 g | Freq: Once | INTRAVENOUS | Status: AC
Start: 2017-05-09 — End: 2017-05-09
  Administered 2017-05-09: 15:00:00 1 g via INTRAVENOUS
  Filled 2017-05-09: qty 1000

## 2017-05-09 NOTE — ED Provider Notes (Signed)
Months of ongoing nontraumatic LBP, positional, radiating to R leg  NAD in triage, walking comfortably  Pt is about 450pounds, suspect MSK etiology. I do not see indication for CT acutely from triage      I have briefly evaluated this patient as triage physician in order to facilitate and initiate the ordering of laboratory and imaging studies as needed.  I am not the primary physician taking care of this patient.            Lucky Rathke, MD  05/09/17 [phone]

## 2017-05-09 NOTE — ED Provider Notes (Signed)
Attending Note:   The patient was seen by the midlevel (Physician Assistant or Nurse Practitioner) or the resident. I have also seen and examined the patient. The pertinent elements of my history, exam, assessment and plan are outlined below. I agree with assessment and care plan, and confirm the diagnosis(es).      HPI: Pt is [name] w/ PMH of obesity, HTN, lower back pain who presents with 2 days of dysuria [name] R lower back pain and hematuria. Pt notes his R lower back pain radiates into his groin area, and notes of seeing some blood after he urinated.       PE: CVA tenderness. No midline spinal tenderness. No neurologic defect.     MDM: Pt is [name] who presents with R LBP, hematuria, dysuria. UA with signs of infection. No concern clinically for nephrolithiasis, no gross hematuria. Will provide ancef IV x 1 and [place] with Omnicef for Pyelonephritis. Urine cultures pending. No sepsis. Tolerating PO well. Pain controlled, VSS. Return precautions given, close f/u advised. Urology/PCP f/u given.     O2 sat-           saturation: 98 %; Oxygen use: room air; Interpretation: Normal              I was acting as [name] Neurosurgeon for Palma Holter, MD on [facility] [name]  Treatment Team: Scribe: Ardeth Perfect, Go Sammie Bench     I am the first provider for this patient and I personally performed the services documented. Treatment Team: Scribe: Ardeth Perfect, Go Sammie Bench is scribing for me on [name],Gregory [name]. This note and the patient instructions accurately reflect work and decisions made by me.  Palma Holter, MD           Gae Bon, MD  05/16/17 1910

## 2017-05-09 NOTE — ED Provider Notes (Signed)
[facility] EMERGENCY DEPARTMENT APP H&P         CLINICAL SUMMARY          Diagnosis:    .     Final diagnoses:   Pyelonephritis         MDM Notes:    51 y.o. male with past medical history of hypertension, gastroesophageal reflux disease and chronic low back pain who presents with 2 days worth of dysuria, pressure at the head of his penis after urination and 2 episodes of drops of blood coming from his penis after urination.     Given pts complaints will obtain labs and urine to eval for uti, pyelo, kidney function.   Labs show uti infection with blood but very low RBC. Low back exam not very impressive with CVA tenderness. Will treat pt for uti/pyelo and have pt fu with urology.   One dose of IV abx given and script for omnicef.     MDM    DDx-              Initial differential diagnosis included: kidney stone, uti, pyelonephritis.         Disposition:       ED Disposition     ED Disposition Condition Date/Time Comment    Discharge  Thu May 09, 2017  2:46 PM [name] discharge to home/self care.    Condition at disposition: Stable             Discharge         Discharge Prescriptions     Medication Sig Dispense Auth. Provider    cefdinir (OMNICEF) 300 MG capsule Take 1 capsule (300 mg total) by mouth 2 (two) times daily for 10 days. 20 capsule Vigue-Adair, Brycelynn Stampley L, PA                      CLINICAL INFORMATION        HPI:      Chief Complaint: Hematuria  .    [name] is a 51 y.o. male with past medical history of hypertension, gastroesophageal reflux disease and chronic low back pain who presents with 2 days worth of dysuria, pressure at the head of his penis after urination and 2 episodes of drops of blood coming from his penis after urination.  Patient states he does have chronic low back pain.  However, the pain on the right side of his back has been increased over the past few days and has been radiating into the groin.  Patient denies a history of kidney stones.   Denies fevers, chills, discharge from the penis.           History obtained from: Patient      ROS:      Positive and negative ROS elements as per HPI.  All other systems reviewed and negative.      Physical Exam:      Pulse (!) 103  BP 147/90  Resp 18  SpO2 98 %  Temp 97.2 F (36.2 C)    Physical Exam   Constitutional: He is oriented to person, place, and time. Vital signs are normal. He appears well-developed and well-nourished.   NO ACUTE DISTRESS     HENT:   Head: Normocephalic and atraumatic.   Eyes: Pupils are equal, round, and reactive to light. Conjunctivae and EOM are normal.   Neck: Trachea normal and normal range of motion.   Cardiovascular: Normal rate, regular rhythm and  normal heart sounds.    Pulmonary/Chest: Effort normal and breath sounds normal. No respiratory distress.   Abdominal: Soft. Normal appearance. There is no tenderness. There is no CVA tenderness.   Musculoskeletal: Normal range of motion.        Lumbar back: He exhibits tenderness. He exhibits normal range of motion and no bony tenderness.        Back:    Neurological: He is alert and oriented to person, place, and time. He has normal strength. GCS eye subscore is 4. GCS verbal subscore is 5. GCS motor subscore is 6.   Skin: Skin is warm, dry and intact.   Psychiatric: He has a normal mood and affect. His speech is normal and behavior is normal. Judgment and thought content normal.                 PAST HISTORY        Primary Care Provider: [name], MD        PMH/PSH:    .     Past Medical History:   Diagnosis Date   . GERD (gastroesophageal reflux disease)    . Hypertension    . Lower back pain        He has no past surgical history on file.      Social/Family History:      He reports that he has been smoking Cigarettes.  He has been smoking about 1.00 pack per day. He uses smokeless tobacco. He reports that he drinks alcohol. He reports that he uses drugs.    History reviewed. No pertinent family history.      Listed  Medications on Arrival:    .     Discharge Medication List as of 05/09/2017  2:46 PM      CONTINUE these medications which have NOT CHANGED    Details   amlodipine-benazepril (LOTREL) 10-20 MG per capsule Take 1 capsule by mouth daily., Until Discontinued, Historical Med      benzonatate (TESSALON) 100 MG capsule Take 1 capsule (100 mg total) by mouth 3 (three) times daily as needed for Cough., Starting 06/03/2014, Until Discontinued, Print      cloNIDine (CATAPRES) 0.3 MG tablet Take 0.3 mg by mouth 2 (two) times daily., Until Discontinued, Historical Med      hydrochlorothiazide (HYDRODIURIL) 25 MG tablet Take 25 mg by mouth daily., Until Discontinued, Historical Med      omeprazole (PRILOSEC) 40 MG capsule Take 40 mg by mouth daily., Until Discontinued, Historical Med            Allergies: He has No Known Allergies.            VISIT INFORMATION        Clinical Course in the ED:      Counseling: I have spoke with the patient/family and discussed today's findings, in addition to providing specific details for the plan of care. Questions are answered and the patient agrees with the plan. Specific return precautions discussed.           Medications Given in the ED:    .     ED Medication Orders     Start Ordered     Status Ordering Provider    05/09/17 1421 05/09/17 1420  cefTRIAXone (ROCEPHIN) 1 g in sodium chloride 0.9 % 100 mL IVPB mini-bag plus  Once     Route: Intravenous  Ordered Dose: 1 g     Last MAR action:  New Bag VIGUE-ADAIR,  Thatcher Doberstein L            Procedures:      Procedures      Interpretations:    O2 sat-           saturation: 100 %; Oxygen use: room air; Interpretation: Normal                 RESULTS        Lab Results:      Results     Procedure Component Value Units Date/Time    Lipase [[id number]] Collected:  05/09/17 1350    Specimen:  Blood Updated:  05/09/17 1424     Lipase 9 U/L     GFR [[id number]] Collected:  05/09/17 1350     Updated:  05/09/17 1424     EGFR >60.0    Comprehensive metabolic panel  [[id number]]  (Abnormal) Collected:  05/09/17 1350    Specimen:  Blood Updated:  05/09/17 1424     Glucose 89 mg/dL      BUN 08.6 mg/dL      Creatinine 0.9 mg/dL      Sodium 578 mEq/L      Potassium 3.9 mEq/L      Chloride 105 mEq/L      CO2 26 mEq/L      Calcium 9.6 mg/dL      Protein, Total 7.6 g/dL      Albumin 3.8 g/dL      AST (SGOT) 17 U/L      ALT 20 U/L      Alkaline Phosphatase 69 U/L      Bilirubin, Total 0.7 mg/dL      Globulin 3.8 (H) g/dL      Albumin/Globulin Ratio 1.0    Urine Culture [[id number]] Collected:  05/09/17 1350    Specimen:  Urine from Urine, Clean Catch Updated:  05/09/17 1423    Narrative:       Replace urinary catheter prior to obtaining the urine culture  if it has been in place for greater than or equal to 14  days:->N/A Pediatric Patient  Indications for Urine Culture:->Suprapubic Pain/Tenderness or  Dysuria    UA, Reflex to Microscopic (pts  3 + yrs) [[id number]]  (Abnormal) Collected:  05/09/17 1351    Specimen:  Urine Updated:  05/09/17 1412     Urine Type Clean Catch     Color, UA Yellow     Clarity, UA Clear     Specific Gravity UA 1.019     Urine pH 6.0     Leukocyte Esterase, UA Small (A)     Nitrite, UA Negative     Protein, UR 30 (A)     Glucose, UA Negative     Ketones UA Negative     Urobilinogen, UA Normal mg/dL      Bilirubin, UA Negative     Blood, UA Moderate (A)     RBC, UA 3 - 5 /hpf      WBC, UA 26 - 50 (A) /hpf      Squamous Epithelial Cells, Urine 0 - 5 /hpf     CBC with differential [[id number]]  (Abnormal) Collected:  05/09/17 1350    Specimen:  Blood from Blood Updated:  05/09/17 1410     WBC 5.50 x10 3/uL      Hgb 14.5 g/dL      Hematocrit 03.4 %      Platelets 296 x10 3/uL      RBC 5.22 x10 6/uL  MCV 85.6 fL      MCH 27.8 (L) pg      MCHC 32.4 g/dL      RDW 17 (H) %      MPV 10.6 fL      Neutrophils 65.6 %      Lymphocytes Automated 22.4 %      Monocytes 10.2 %      Eosinophils Automated 1.1 %      Basophils Automated 0.5 %      Immature Granulocyte 0.2 %       Nucleated RBC 0.0 /100 WBC      Neutrophils Absolute 3.61 x10 3/uL      Abs Lymph Automated 1.23 x10 3/uL      Abs Mono Automated 0.56 x10 3/uL      Abs Eos Automated 0.06 x10 3/uL      Absolute Baso Automated 0.03 x10 3/uL      Absolute Immature Granulocyte 0.01 x10 3/uL      Absolute NRBC 0.00 x10 3/uL               Radiology Results:      No orders to display               Scribe Attestation:      No scribe involved in the care of this patient                            Richardson Landry, [place]  05/09/17 1731

## 2017-05-09 NOTE — Discharge Instructions (Signed)
Dear Mr. Derner:    Thank you for choosing the [facility], the premier emergency department in the [place] area.  I hope your visit today was EXCELLENT.    Specific instructions for your visit today:      Pyelonephritis    You have been diagnosed with pyelonephritis. This is also known as a kidney infection.    Pyelonephritis is a bacterial infection in the kidneys. Some symptoms are: Fever (temperature higher than 100.48F / 38C), chills, nausea (sick to the stomach) and vomiting (throwing up). Back pain on one or both sides is another symptom. There is sometimes trouble urinating (peeing). You may also feel very weak. The diagnosis is made based on your symptoms and a test of your urine.    Pyelonephritis most happens most often when a lower urinary tract infection (bladder infection) climbs up the urinary tract and gets into the kidney. It is important to treat a bladder infection early. This is to prevent pyelonephritis and possible kidney damage.    Pyelonephritis is treated with antibiotics, pain and fever medicines and fluids. Some patients need an "IV" if they have nausea (sick to the stomach) or vomiting and cannot keep down the antibiotics, or if they aren't getting better after 2-3 days of oral (by mouth) medications. Most pyelonephritis patients do not need to check into the hospital. A few patients who don't do well with the oral (by mouth) antibiotics or get sicker despite treatment may need to get admitted to the hospital.    YOU SHOULD SEEK MEDICAL ATTENTION IMMEDIATELY, EITHER HERE OR AT THE NEAREST EMERGENCY DEPARTMENT, IF ANY OF THE FOLLOWING OCCURS:   Failure to improve after 2-3 days of antibiotics.   Nausea or vomiting develops and you cannot to keep down medicines or fluids.   Increased weakness or lightheadedness.   Any progressive or worsening symptoms or any other concerns develop.                 If you do not continue to improve or your condition  worsens, please contact your doctor or return immediately to the Emergency Department.    Sincerely,  Gae Bon, MD  Attending Emergency Physician   and  Ronal Fear, PA-C  Emergency Medicine Physician Assistant   [facility] Emergency Department    ONSITE PHARMACY  Our full service onsite pharmacy is located in the ER waiting room.  Open 7 days a week from 9 am to 11 pm.  We accept all major insurances and prices are competitive with major retailers.  Ask your provider to print your prescriptions down to the pharmacy to speed you on your way home.    OBTAINING A PRIMARY CARE APPOINTMENT    Primary care physicians (PCPs, also known as primary care doctors) are either internists or family medicine doctors. Both types of PCPs focus on health promotion, disease prevention, patient education and counseling, and treatment of acute and chronic medical conditions.    Call for an appointment with a primary care doctor.  Ask to see who is taking new patients.     [facility]  telephone:  [phone]  [email or web address]    DOCTOR REFERRALS  Call [phone] (available 24 hours a day, 7 days a week) if you need any further referrals and we can help you find a primary care doctor or specialist.  Also, available online at:  [email or web address]    YOUR CONTACT INFORMATION  Before leaving please check with registration  to make sure we have an up-to-date contact number.  You can call registration at [phone] to update your information.  For questions about your hospital bill, please call [phone].  For questions about your Emergency Dept Physician bill please call [phone].      [place]  If you need help with health or social services, please call 2-1-1 for a free referral to resources in your area.  2-1-1 is a free service connecting people with information on health insurance, free clinics, pregnancy, mental health, dental care, food assistance,  housing, and substance abuse counseling.  Also, available online at:  http://www.[place].org    MEDICAL RECORDS AND TESTS  Certain laboratory test results do not come back the same day, for example urine cultures.   We will contact you if other important findings are noted.  Radiology films are often reviewed again to ensure accuracy.  If there is any discrepancy, we will notify you.      Please call [phone] to pick up a complimentary CD of any radiology studies performed.  If you or your doctor would like to request a copy of your medical records, please call [phone].      ORTHOPEDIC INJURY   Please know that significant injuries can exist even when an initial x-ray is read as normal or negative.  This can occur because some fractures (broken bones) are not initially visible on x-rays.  For this reason, close outpatient follow-up with your primary care doctor or bone specialist (orthopedist) is required.    MEDICATIONS AND FOLLOWUP  Please be aware that some prescription medications can cause drowsiness.  Use caution when driving or operating machinery.    The examination and treatment you have received in our Emergency Department is provided on an emergency basis, and is not intended to be a substitute for your primary care physician.  It is important that your doctor checks you again and that you report any new or remaining problems at that time.      [place]  The nearest 24 hour pharmacy is:    [facility] [place], [place] [address]  [place]  [facility])  Call to start or finish an application, compare plans, enroll or ask a question.  [place]: [phone]  Web:  Healthcare.gov    Help Enrolling in [place]  970-765-3670 (TOLL-FREE)  [phone] (TTY)  Web:  Http://www.coverva.org    Local Help Enrolling in the [place]  279-673-7116  (MAIN)  Email:  health-help@nvfs .org  Web:  http://lewis-perez.info/  Address:  [address] [place], [place] [address]    SEDATING MEDICATIONS  Sedating medications include strong pain medications (e.g. narcotics), muscle relaxers, benzodiazepines (used for anxiety and as muscle relaxers), Benadryl/diphenhydramine and other antihistamines for allergic reactions/itching, and other medications.  If you are unsure if you have received a sedating medication, please ask your physician or nurse.  If you received a sedating medication: DO NOT drive a car. DO NOT operate machinery. DO NOT perform jobs where you need to be alert.  DO NOT drink alcoholic beverages while taking this medicine.     If you get dizzy, sit or lie down at the first signs. Be careful going up and down stairs.  Be extra careful to prevent falls.     Never give this medicine  to others.     Keep this medicine out of reach of children.     Do not take or save old medicines. Throw them away when outdated.     Keep all medicines in a cool, dry place. DO NOT keep them in your bathroom medicine cabinet or in a cabinet above the stove.    MEDICATION REFILLS  Please be aware that we cannot refill any prescriptions through the ER. If you need further treatment from what is provided at your ER visit, please follow up with your primary care doctor or your pain management specialist.    [place]  Did you know [facility] has two freestanding ERs located just a few miles away?  [facility] [place] have short wait times, easy free parking directly in front of the building and top patient satisfaction scores - and the same Board Certified Emergency Medicine doctors as [facility].

## 2018-11-03 ENCOUNTER — Other Ambulatory Visit: Payer: Self-pay

## 2018-11-03 ENCOUNTER — Emergency Department (HOSPITAL_COMMUNITY)
Admission: EM | Admit: 2018-11-03 | Discharge: 2018-11-03 | Disposition: A | Discharge status: Home / Self Care | Payer: Self-pay | Attending: Emergency Medicine | Admitting: Emergency Medicine

## 2018-11-03 ENCOUNTER — Emergency Department (HOSPITAL_COMMUNITY): Payer: Self-pay

## 2018-11-03 ENCOUNTER — Encounter (HOSPITAL_COMMUNITY): Payer: Self-pay

## 2018-11-03 DIAGNOSIS — R059 Cough, unspecified: Secondary | ICD-10-CM

## 2018-11-03 DIAGNOSIS — R05 Cough: Secondary | ICD-10-CM | POA: Insufficient documentation

## 2018-11-03 DIAGNOSIS — R062 Wheezing: Secondary | ICD-10-CM | POA: Insufficient documentation

## 2018-11-03 DIAGNOSIS — I10 Essential (primary) hypertension: Secondary | ICD-10-CM | POA: Insufficient documentation

## 2018-11-03 DIAGNOSIS — F17210 Nicotine dependence, cigarettes, uncomplicated: Secondary | ICD-10-CM | POA: Insufficient documentation

## 2018-11-03 HISTORY — DX: Essential (primary) hypertension: I10

## 2018-11-03 MED ORDER — BENZONATATE 100 MG PO CAPS: 100.0000 mg | ORAL_CAPSULE | Freq: Three times a day (TID) | ORAL | 0 refills | Status: DC

## 2018-11-03 MED ORDER — GUAIFENESIN ER 600 MG PO TB12: 600.0000 mg | ORAL_TABLET | Freq: Two times a day (BID) | ORAL | 0 refills | Status: AC

## 2018-11-03 MED ORDER — FLUTICASONE PROPIONATE 50 MCG/ACT NA SUSP: 1.0000 | Freq: Every day | NASAL | 2 refills | Status: AC

## 2018-11-03 NOTE — Discharge Instructions (Addendum)
Please read the attached information.  Please use either Tessalon or Mucinex as needed for cough, Flonase as needed for nasal congestion.  Please return immediately if develop any new or worsening signs or symptoms.

## 2018-11-03 NOTE — ED Notes (Signed)
ED Provider at bedside. 

## 2018-11-03 NOTE — ED Provider Notes (Signed)
[facility] EMERGENCY DEPARTMENT Provider Note   CSN: [id number] Arrival date & time: 11/03/18  1133   History   Chief Complaint Chief Complaint  Patient presents with  . Dental Pain  . Wheezing    HPI Chad [name] is a 53 y.o. male.  HPI   53 year old male presents today with complaints of cough.  Patient notes approximate 3 weeks ago he developed a respiratory infection.  He notes nasal congestion rhinorrhea facial pain and cough.  Patient notes he feels significantly improved over initial symptom onset but notes he continues to have cough.  Patient notes some minor shortness of breath with ambulation.  He denies any acute pain, denies any lower extremity swelling or edema.  Patient notes he is a smoker smoking for the past 15 years, noting 0.25 packs per day.  Patient notes a past medical history of hypertension for which she takes medication.  No history of asthma or COPD.  Patient also notes some right-sided dental pain for an unknown amount of time, he notes this improves with Aleve but then the pain comes back again.  Patient notes subjective swelling to the top of his mouth, none presently.   Past Medical History:  Diagnosis Date  . Hypertension     There are no active problems to display for this patient.   History reviewed. No pertinent surgical history.      Home Medications    Prior to Admission medications   Medication Sig Start Date End Date Taking? Authorizing Provider  benzonatate (TESSALON) 100 MG capsule Take 1 capsule (100 mg total) by mouth every 8 (eight) hours. 11/03/18   Anam Bobby, Tinnie Gens, PA-C  fluticasone (FLONASE) 50 MCG/ACT nasal spray Place 1 spray into both nostrils daily. 11/03/18   Wilbern Pennypacker, Tinnie Gens, PA-C  guaiFENesin (MUCINEX) 600 MG 12 hr tablet Take 1 tablet (600 mg total) by mouth 2 (two) times daily. 11/03/18   Eyvonne Mechanic, PA-C    Family History History reviewed. No pertinent family history.  Social History Social  History   Tobacco Use  . Smoking status: Current Every Day Smoker    Packs/day: 0.50    Types: Cigarettes  . Smokeless tobacco: Never Used  Substance Use Topics  . Alcohol use: Yes    Comment: occ  . Drug use: Yes    Types: Marijuana     Allergies   Patient has no known allergies.   Review of Systems Review of Systems  All other systems reviewed and are negative.   Physical Exam Updated Vital Signs BP (!) 188/101 (BP Location: Right Arm)   Pulse 89   Temp 98.6 F (37 C) (Oral)   Resp 20   SpO2 97%   Physical Exam Vitals signs and nursing note reviewed.  Constitutional:      Appearance: He is well-developed.  HENT:     Head: Normocephalic and atraumatic.  Eyes:     General: No scleral icterus.       Right eye: No discharge.        Left eye: No discharge.     Conjunctiva/sclera: Conjunctivae normal.     Pupils: Pupils are equal, round, and reactive to light.  Neck:     Musculoskeletal: Normal range of motion.     Vascular: No JVD.     Trachea: No tracheal deviation.  Cardiovascular:     Rate and Rhythm: Normal rate and regular rhythm.  Pulmonary:     Effort: Pulmonary effort is normal. No respiratory distress.  Breath sounds: Normal breath sounds. No stridor. No wheezing, rhonchi or rales.  Chest:     Chest wall: No tenderness.  Neurological:     Mental Status: He is alert and oriented to person, place, and time.     Coordination: Coordination normal.  Psychiatric:        Behavior: Behavior normal.        Thought Content: Thought content normal.        Judgment: Judgment normal.    ED Treatments / Results  Labs (all labs ordered are listed, but only abnormal results are displayed) Labs Reviewed - No data to display  EKG None  Radiology Dg Chest 2 View  Result Date: 11/03/2018 CLINICAL DATA:  Pt states he has been having wheezing and chest congestion for several weeks. Pt also reports upper mouth pain causing headaches. Pt also reports SOB  after walking any distance. EXAM: CHEST - 2 VIEW COMPARISON:  None. FINDINGS: Cardiac silhouette is normal in size. Normal mediastinal and hilar contours. Clear lungs.  No pleural effusion or pneumothorax. Skeletal structures are unremarkable. IMPRESSION: No active cardiopulmonary disease. Electronically Signed   By: Amie Portland M.D.   On: 11/03/2018 13:03    Procedures Procedures (including critical care time)  Medications Ordered in ED Medications - No data to display   Initial Impression / Assessment and Plan / ED Course  I have reviewed the triage vital signs and the nursing notes.  Pertinent labs & imaging results that were available during my care of the patient were reviewed by me and considered in my medical decision making (see chart for details).     Labs:   Imaging: DG chest 2 view  Consults:  Therapeutics:  Discharge Meds:   Assessment/Plan: 53 year old male presents today with cough.  This is likely secondary to previous upper respiratory infection.  He has no signs of infection presently, he has clear lung sounds and reassuring vital signs.  Patient  hypertensive here, asymptomatic.  Patient does have blood pressure medication at home and is encouraged to use it accordingly.  Patient will be discharged with outpatient follow-up and strict return precautions.  Verbalized understanding and agreement to today's plan had no further questions or concerns at the time of discharge.  Final Clinical Impressions(s) / ED Diagnoses   Final diagnoses:  Cough    ED Discharge Orders         Ordered    fluticasone (FLONASE) 50 MCG/ACT nasal spray  Daily     11/03/18 1445    benzonatate (TESSALON) 100 MG capsule  Every 8 hours     11/03/18 1445    guaiFENesin (MUCINEX) 600 MG 12 hr tablet  2 times daily     11/03/18 1445           Eyvonne Mechanic, PA-C 11/03/18 1546    Long, Arlyss Repress, MD 11/04/18 [phone]

## 2018-11-03 NOTE — ED Triage Notes (Signed)
Pt states he has been having wheezing and chest congestion for several weeks. Pt also reports upper mouth pain causing headaches. Pt also reports DOE and SOB after walking any distance.

## 2021-01-14 ENCOUNTER — Other Ambulatory Visit: Payer: Self-pay

## 2021-01-14 DIAGNOSIS — I83893 Varicose veins of bilateral lower extremities with other complications: Secondary | ICD-10-CM

## 2021-01-20 ENCOUNTER — Ambulatory Visit (INDEPENDENT_AMBULATORY_CARE_PROVIDER_SITE_OTHER): Payer: Federal, State, Local not specified - PPO | Admitting: Vascular Surgery

## 2021-01-20 ENCOUNTER — Other Ambulatory Visit: Payer: Self-pay

## 2021-01-20 ENCOUNTER — Encounter: Payer: Self-pay | Admitting: Vascular Surgery

## 2021-01-20 ENCOUNTER — Ambulatory Visit (HOSPITAL_COMMUNITY)
Admission: RE | Admit: 2021-01-20 | Discharge: 2021-01-20 | Disposition: A | Discharge status: Home / Self Care | Payer: Federal, State, Local not specified - PPO | Source: Ambulatory Visit | Attending: Vascular Surgery | Admitting: Vascular Surgery

## 2021-01-20 DIAGNOSIS — I83893 Varicose veins of bilateral lower extremities with other complications: Secondary | ICD-10-CM | POA: Diagnosis present

## 2021-01-20 DIAGNOSIS — M7989 Other specified soft tissue disorders: Secondary | ICD-10-CM | POA: Diagnosis not present

## 2021-01-20 NOTE — Progress Notes (Signed)
Patient name: Chad [name] MRN: [id number] DOB: [birth year] Sex: male  REASON FOR CONSULT: Evaluate bilateral venous stasis ulcers  HPI: Chad [name] is a 55 y.o. male, with history of morbid obesity and hypertension who presents for evaluation of bilateral lower extremity venous stasis ulcers.  Patient states the stairs in his house recently broke and he had to move into a hotel.  Ultimately he was not able to elevate his legs and over the last month and has developed weeping and open ulcers of the bilateral lower extremities.  He has had no previous DVTs.  He's had no previous vein interventions or other lower extremity interventions.  He is morbidly obese with a BMI of 65.  Does not have compression stockings that fit at this time.  Past Medical History:  Diagnosis Date  . Hypertension     History reviewed. No pertinent surgical history.  Family History  Problem Relation Age of Onset  . Thyroid cancer Mother   . Dementia Mother   . Prostate cancer Father   . Dementia Father     SOCIAL HISTORY: Social History   Socioeconomic History  . Marital status: Married    Spouse name: Not on file  . Number of children: Not on file  . Years of education: Not on file  . Highest education level: Not on file  Occupational History  . Not on file  Tobacco Use  . Smoking status: Current Every Day Smoker    Packs/day: 0.50    Types: Cigarettes  . Smokeless tobacco: Never Used  Substance and Sexual Activity  . Alcohol use: Yes    Comment: occ  . Drug use: Yes    Types: Marijuana  . Sexual activity: Not on file  Other Topics Concern  . Not on file  Social History Narrative  . Not on file   Social Determinants of Health   Financial Resource Strain: Not on file  Food Insecurity: Not on file  Transportation Needs: Not on file  Physical Activity: Not on file  Stress: Not on file  Social Connections: Not on file  Intimate Partner Violence: Not on file    No Known  Allergies  Current Outpatient Medications  Medication Sig Dispense Refill  . amLODipine-benazepril (LOTREL) 10-20 MG capsule Take 1 capsule by mouth daily.    Marland Kitchen amoxicillin (AMOXIL) 500 MG tablet Take 500 mg by mouth 3 (three) times daily.    . cloNIDine (CATAPRES) 0.3 MG tablet Take by mouth.    . hydrochlorothiazide (HYDRODIURIL) 25 MG tablet Take by mouth.    . benzonatate (TESSALON) 100 MG capsule Take 1 capsule (100 mg total) by mouth every 8 (eight) hours. (Patient not taking: Reported on 01/20/2021) 21 capsule 0  . fluticasone (FLONASE) 50 MCG/ACT nasal spray Place 1 spray into both nostrils daily. (Patient not taking: Reported on 01/20/2021) 9.9 g 2  . guaiFENesin (MUCINEX) 600 MG 12 hr tablet Take 1 tablet (600 mg total) by mouth 2 (two) times daily. (Patient not taking: Reported on 01/20/2021) 30 tablet 0   No current facility-administered medications for this visit.    REVIEW OF SYSTEMS:  [X]  denotes positive finding, [ ]  denotes negative finding Cardiac  Comments:  Chest pain or chest pressure:    Shortness of breath upon exertion:    Short of breath when lying flat:    Irregular heart rhythm:        Vascular    Pain in calf, thigh, or hip brought on by ambulation:  Pain in feet at night that wakes you up from your sleep:     Blood clot in your veins:    Leg swelling:  x       Pulmonary    Oxygen at home:    Productive cough:     Wheezing:         Neurologic    Sudden weakness in arms or legs:     Sudden numbness in arms or legs:     Sudden onset of difficulty speaking or slurred speech:    Temporary loss of vision in one eye:     Problems with dizziness:         Gastrointestinal    Blood in stool:     Vomited blood:         Genitourinary    Burning when urinating:     Blood in urine:        Psychiatric    Major depression:         Hematologic    Bleeding problems:    Problems with blood clotting too easily:        Skin    Rashes or ulcers:         Constitutional    Fever or chills:      PHYSICAL EXAM: Vitals:   01/20/21 1017 01/20/21 1025  BP: (!) 203/98 (!) 186/105  Pulse: 81 81  Resp: 18   Temp: 98.3 F (36.8 C)   TempSrc: Temporal   SpO2: 96%   Weight: (!) 537 lb (243.6 kg)   Height: 6\' 4"  (1.93 m)     GENERAL: The patient is a well-nourished male, in no acute distress. The vital signs are documented above. CARDIAC: There is a regular rate and rhythm.  VASCULAR:  Palpable dorsalis pedis pulses bilateral lower extremities PULMONARY: No respiratory dsitress. ABDOMEN: Soft and non-tender.  MUSCULOSKELETAL: There are no major deformities or cyanosis. NEUROLOGIC: No focal weakness or paresthesias are detected. SKIN: There are no ulcers or rashes noted. PSYCHIATRIC: The patient has a normal affect.        DATA:   Bilateral lower extremity venous reflux study shows no evidence of DVT and no significant lower extremity venous reflux except for small segment of SSV in right leg.  Assessment/Plan:  55 year old male presents for evaluation of possible bilateral lower extremity venous stasis ulcers.  I discussed with him in detail that his venous reflux study actually does not show any evidence of significant lower extremity venous reflux except for small segment in right SSV.  I suspect a lot of his leg swelling is related to his morbid obesity with a BMI of 65 in addition to some likely underlying lymphedema.  In addition he has easily palpable dorsalis pedis pulses with no evidence of underlying arterial insufficiency.  I have recommended that we refer him to the wound clinic at [facility] for further evaluation and/or possible Unna boots.  In addition we will get him measured for knee-high compression stockings today discussed he could certainly wear these after he gets these wounds healed and if they do not fit we could do Ace wrap's.  No indication for surgical intervention from my standpoint.  Did discuss the  importance of leg elevation, exercise, weight loss etc.   [facility], MD [facility] [place] Office: [phone]

## 2021-02-01 ENCOUNTER — Other Ambulatory Visit: Payer: Self-pay

## 2021-02-01 ENCOUNTER — Encounter (HOSPITAL_BASED_OUTPATIENT_CLINIC_OR_DEPARTMENT_OTHER): Payer: Federal, State, Local not specified - PPO | Attending: Physician Assistant | Admitting: Internal Medicine

## 2021-02-01 DIAGNOSIS — L97819 Non-pressure chronic ulcer of other part of right lower leg with unspecified severity: Secondary | ICD-10-CM | POA: Insufficient documentation

## 2021-02-01 DIAGNOSIS — L97829 Non-pressure chronic ulcer of other part of left lower leg with unspecified severity: Secondary | ICD-10-CM | POA: Insufficient documentation

## 2021-02-01 NOTE — Progress Notes (Signed)
CAIDE, CAMPI ([id number]) Visit Report for 02/01/2021 Abuse/Suicide Risk Screen Details Patient Name: Date of Service: Chad [name] [facility] 02/01/2021 1:15 PM Medical Record Number: [id number] Patient Account Number: [id number] Date of Birth/Sex: Treating RN: [birth year] (55 y.o. Chad [name] [facility] Ameerah Huffstetler: PCP, NO Other Clinician: Referring Griselda Tosh: Treating Willine Schwalbe/Extender: [name] in Treatment: 0 Abuse/Suicide Risk Screen Items Answer ABUSE RISK SCREEN: Has anyone close to you tried to hurt or harm you recentlyo No Do you feel uncomfortable with anyone in your familyo No Has anyone forced you do things that you didnt want to doo No Electronic Signature(s) Signed: 02/01/2021 6:30:22 PM By: Zandra Abts RN, BSN Entered By: Zandra Abts on 02/01/2021 13:56:14 -------------------------------------------------------------------------------- Activities of Daily Living Details Patient Name: Date of Service: Chad [name] [facility] 02/01/2021 1:15 PM Medical Record Number: [id number] Patient Account Number: [id number] Date of Birth/Sex: Treating RN: 01-Oct-1965 (55 y.o. Chad [name] Primary Care Lynnsey Barbara: PCP, NO Other Clinician: Referring Abrar Koone: Treating Jenella Craigie/Extender: [name] in Treatment: 0 Activities of Daily Living Items Answer Activities of Daily Living (Please select one for each item) Drive Automobile Completely Able T Medications ake Completely Able Use T elephone Completely Able Care for Appearance Completely Able Use T oilet Completely Able Bath / Shower Completely Able Dress Self Completely Able Feed Self Completely Able Walk Completely Able Get In / Out Bed Completely Able Housework Completely Able Prepare Meals Completely Able Handle Money Completely Able Shop for Self Completely Able Electronic Signature(s) Signed: 02/01/2021 6:30:22 PM By: Zandra Abts RN, BSN Entered By: Zandra Abts on 02/01/2021 13:56:32 -------------------------------------------------------------------------------- Education Screening Details Patient Name: Date of Service: Chad [name] [name] 02/01/2021 1:15 PM Medical Record Number: [id number] Patient Account Number: [id number] Date of Birth/Sex: Treating RN: [birth year] (55 y.o. Chad [name] Primary Care Dasean Brow: PCP, NO Other Clinician: Referring Harrel Ferrone: Treating Samaya Boardley/Extender: [name] in Treatment: 0 Primary Learner Assessed: Patient Learning Preferences/Education Level/Primary Language Learning Preference: Explanation, Demonstration, Printed Material Highest Education Level: High School Preferred Language: English Cognitive Barrier Language Barrier: No Translator Needed: No Memory Deficit: No Emotional Barrier: No Cultural/Religious Beliefs Affecting Medical Care: No Physical Barrier Impaired Vision: No Impaired Hearing: No Decreased Hand dexterity: No Knowledge/Comprehension Knowledge Level: High Comprehension Level: High Ability to understand written instructions: High Ability to understand verbal instructions: High Motivation Anxiety Level: Calm Cooperation: Cooperative Education Importance: Acknowledges Need Interest in Health Problems: Asks Questions Perception: Coherent Willingness to Engage in Self-Management High Activities: Readiness to Engage in Self-Management High Activities: Electronic Signature(s) Signed: 02/01/2021 6:30:22 PM By: Zandra Abts RN, BSN Entered By: Zandra Abts on 02/01/2021 13:56:52 -------------------------------------------------------------------------------- Fall Risk Assessment Details Patient Name: Date of Service: Chad [name] [facility] 02/01/2021 1:15 PM Medical Record Number: [id number] Patient Account Number: [id number] Date of Birth/Sex: Treating RN: Jun 16, 1966 (55 y.o. Chad [name] Primary Care Kerryann Allaire: PCP, NO Other  Clinician: Referring Marillyn Goren: Treating Kable Haywood/Extender: [name] in Treatment: 0 Fall Risk Assessment Items Have you had 2 or more falls in the last 12 monthso 0 No Have you had any fall that resulted in injury in the last 12 monthso 0 No FALLS RISK SCREEN History of falling - immediate or within 3 months 0 No Secondary diagnosis (Do you have 2 or more medical diagnoseso) 0 No Ambulatory aid None/bed rest/wheelchair/nurse 0 Yes Crutches/cane/walker 0 No Furniture 0 No Intravenous therapy Access/Saline/Heparin Lock 0 No Gait/Transferring Normal/ bed rest/ wheelchair 0 Yes Weak (  short steps with or without shuffle, stooped but able to lift head while walking, may seek 0 No support from furniture) Impaired (short steps with shuffle, may have difficulty arising from chair, head down, impaired 0 No balance) Mental Status Oriented to own ability 0 Yes Electronic Signature(s) Signed: 02/01/2021 6:30:22 PM By: Zandra Abts RN, BSN Entered By: Zandra Abts on 02/01/2021 13:57:04 -------------------------------------------------------------------------------- Foot Assessment Details Patient Name: Date of Service: Chad [name] [facility] 02/01/2021 1:15 PM Medical Record Number: [id number] Patient Account Number: [id number] Date of Birth/Sex: Treating RN: 1965/12/07 (55 y.o. Chad [name] Primary Care Magdala Brahmbhatt: PCP, NO Other Clinician: Referring Mylee Falin: Treating Leticia Mcdiarmid/Extender: [name] in Treatment: 0 Foot Assessment Items Site Locations + = Sensation present, - = Sensation absent, C = Callus, U = Ulcer R = Redness, W = Warmth, M = Maceration, PU = Pre-ulcerative lesion F = Fissure, S = Swelling, D = Dryness Assessment Right: Left: Other Deformity: No No Prior Foot Ulcer: No No Prior Amputation: No No Charcot Joint: No No Ambulatory Status: Ambulatory Without Help Gait: Steady Electronic Signature(s) Signed:  02/01/2021 6:30:22 PM By: Zandra Abts RN, BSN Entered By: Zandra Abts on 02/01/2021 13:57:27 -------------------------------------------------------------------------------- Nutrition Risk Screening Details Patient Name: Date of Service: Chad [name] [facility] 02/01/2021 1:15 PM Medical Record Number: [id number] Patient Account Number: [id number] Date of Birth/Sex: Treating RN: Jun 30, 1966 (55 y.o. Chad [name] Primary Care Zaryah Seckel: PCP, NO Other Clinician: Referring Christianna Belmonte: Treating Delmi Fulfer/Extender: [name] in Treatment: 0 Height (in): 76 Weight (lbs): 500 Body Mass Index (BMI): 60.9 Nutrition Risk Screening Items Score Screening NUTRITION RISK SCREEN: I have an illness or condition that made me change the kind and/or amount of food I eat 0 No I eat fewer than two meals per day 0 No I eat few fruits and vegetables, or milk products 0 No I have three or more drinks of beer, liquor or wine almost every day 0 No I have tooth or mouth problems that make it hard for me to eat 0 No I don't always have enough money to buy the food I need 0 No I eat alone most of the time 0 No I take three or more different prescribed or over-the-counter drugs a day 1 Yes Without wanting to, I have lost or gained 10 pounds in the last six months 0 No I am not always physically able to shop, cook and/or feed myself 0 No Nutrition Protocols Good Risk Protocol Moderate Risk Protocol 0 Provide education on nutrition High Risk Proctocol Risk Level: Good Risk Score: 1 Electronic Signature(s) Signed: 02/01/2021 6:30:22 PM By: Zandra Abts RN, BSN Entered By: Zandra Abts on 02/01/2021 13:57:10

## 2021-02-01 NOTE — Progress Notes (Signed)
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 (idiopathic) with inflammation of bilateral lower extremity I89.0 Lymphedema, not elsewhere classified Facility Procedures CPT4: Code [id number] Description: 13 - WOUND CARE VISIT-LEV 3 EST PT Modifier: 25 Quantity: 1 CPT4: [id number] Description: 81 BILATERAL: Application of multi-layer venous compression system; leg (below knee), including ankle  and t. 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

## 2021-02-01 NOTE — Progress Notes (Addendum)
Chad [name], Chad [name] ([id number]) Visit Report for 02/01/2021 Allergy List Details Patient Name: Date of Service: Chad [name] [facility] 02/01/2021 1:15 PM Medical Record Number: [id number] Patient Account Number: [id number] Date of Birth/Sex: Treating RN: 03/30/1966 (55 y.o. Male) Chad [name] Primary Care Kyliana Standen: PCP, NO Other Clinician: Referring Samba Cumba: Treating Nisaiah Bechtol/Extender: [name] in Treatment: 0 Allergies Active Allergies No Known Drug Allergies Allergy Notes Electronic Signature(s) Signed: 02/01/2021 6:30:22 PM By: Chad Abts RN, BSN Entered By: Chad [name] on 02/01/2021 13:47:46 -------------------------------------------------------------------------------- Arrival Information Details Patient Name: Date of Service: Chad [name] [facility] 02/01/2021 1:15 PM Medical Record Number: [id number] Patient Account Number: [id number] Date of Birth/Sex: Treating RN: 01-Jul-1966 (55 y.o. Male) Chad [name] Primary Care Chad [name]: PCP, NO Other Clinician: Referring Chad [name]: Treating Brent Taillon/Extender: [name] in Treatment: 0 Visit Information Patient Arrived: Ambulatory Arrival Time: 13:33 Accompanied By: alone Transfer Assistance: None Patient Identification Verified: Yes Secondary Verification Process Completed: Yes Patient Requires Transmission-Based Precautions: No Patient Has Alerts: Yes Patient Alerts: R ABI: non compressible L ABI: non compressible Electronic Signature(s) Signed: 02/01/2021 6:30:22 PM By: Chad Abts RN, BSN Entered By: Chad [name] on 02/01/2021 14:08:45 -------------------------------------------------------------------------------- Clinic Level of Care Assessment Details Patient Name: Date of Service: Chad [name] [facility] 02/01/2021 1:15 PM Medical Record Number: [id number] Patient Account Number: [id number] Date of Birth/Sex: Treating RN: 06-Oct-1965 (55 y.o. Male) Chad [name] Primary Care Anjel [name]: PCP, NO Other Clinician: Referring Chad [name]: Treating Chad [name]/Extender: [name] in Treatment: 0 Clinic Level of Care Assessment Items TOOL 1 Quantity Score X- 1 0 Use when EandM and Procedure is performed on INITIAL visit ASSESSMENTS - Nursing Assessment / Reassessment X- 1 20 General Physical Exam (combine w/ comprehensive assessment (listed just below) when performed on new pt. evals) X- 1 25 Comprehensive Assessment (HX, ROS, Risk Assessments, Wounds Hx, etc.) ASSESSMENTS - Wound and Skin Assessment / Reassessment []  - 0 Dermatologic / Skin Assessment (not related to wound area) ASSESSMENTS - Ostomy and/or Continence Assessment and Care []  - 0 Incontinence Assessment and Management []  - 0 Ostomy Care Assessment and Management (repouching, etc.) PROCESS - Coordination of Care X - Simple Patient / Family Education for ongoing care 1 15 []  - 0 Complex (extensive) Patient / Family Education for ongoing care X- 1 10 Staff obtains , Records, T Results / Process Orders est []  - 0 Staff telephones HHA, Nursing Homes / Clarify orders / etc []  - 0 Routine Transfer to another Facility (non-emergent condition) []  - 0 Routine Hospital Admission (non-emergent condition) X- 1 15 New Admissions / / Ordering NPWT Apligraf, etc. , []  - 0 Emergency Hospital Admission (emergent condition) PROCESS - Special Needs []  - 0 Pediatric / Minor Patient Management []  - 0 Isolation Patient Management []  - 0 Hearing / Language / Visual special needs []  - 0 Assessment of Community assistance (transportation, D/C planning, etc.) []  - 0 Additional assistance / Altered mentation []  - 0 Support Surface(s) Assessment (bed, cushion, seat, etc.) INTERVENTIONS - Miscellaneous []  - 0 External ear exam []  - 0 Patient Transfer (multiple staff / / Similar devices) []  - 0 Simple Staple / Suture  removal (25 or less) []  - 0 Complex Staple / Suture removal (26 or more) []  - 0 Hypo/Hyperglycemic Management (do not check if billed separately) X- 1 15 Ankle / Brachial Index (ABI) - do not check if billed separately Has the patient been seen at the hospital within the  last three years: Yes Total Score: 100 Level Of Care: New/Established - Level 3 Electronic Signature(s) Signed: 02/01/2021 6:30:22 PM By: Chad Abts RN, BSN Entered By: Chad [name] on 02/01/2021 14:22:07 -------------------------------------------------------------------------------- Compression Therapy Details Patient Name: Date of Service: Chad [name] [name] 02/01/2021 1:15 PM Medical Record Number: [id number] Patient Account Number: [id number] Date of Birth/Sex: Treating RN: [birth year] (55 y.o. Male) Chad [name] Primary Care Makensie Mulhall: PCP, NO Other Clinician: Referring Florene Brill: Treating Ledonna Dormer/Extender: [name] in Treatment: 0 Compression Therapy Performed for Wound Assessment: Wound #1 Left,Distal,Anterior Lower Leg Performed By: Clinician Chad Abts, RN Compression Type: Three Layer Post Procedure Diagnosis Same as Pre-procedure Electronic Signature(s) Signed: 02/01/2021 6:30:22 PM By: Chad Abts RN, BSN Entered By: Chad [name] on 02/01/2021 14:22:40 -------------------------------------------------------------------------------- Compression Therapy Details Patient Name: Date of Service: Chad [name] [name] 02/01/2021 1:15 PM Medical Record Number: [id number] Patient Account Number: [id number] Date of Birth/Sex: Treating RN: 1966-03-22 (55 y.o. Male) Chad [name] Primary Care Nefertiti Mohamad: PCP, NO Other Clinician: Referring Kaleel Schmieder: Treating Chad [name]/Extender: [name] in Treatment: 0 Compression Therapy Performed for Wound Assessment: Wound #2 Left,Lateral Lower Leg Performed By: Clinician Chad Abts, RN Compression  Type: Three Layer Post Procedure Diagnosis Same as Pre-procedure Electronic Signature(s) Signed: 02/01/2021 6:30:22 PM By: Chad Abts RN, BSN Entered By: Chad [name] on 02/01/2021 14:22:40 -------------------------------------------------------------------------------- Compression Therapy Details Patient Name: Date of Service: Chad [name] [name] 02/01/2021 1:15 PM Medical Record Number: [id number] Patient Account Number: [id number] Date of Birth/Sex: Treating RN: May 10, 1966 (55 y.o. Male) Chad [name] Primary Care Julyan Gales: PCP, NO Other Clinician: Referring Chelsa Stout: Treating Amarian Botero/Extender: [name] in Treatment: 0 Compression Therapy Performed for Wound Assessment: Wound #3 Right,Lateral Lower Leg Performed By: Clinician Chad Abts, RN Compression Type: Three Layer Post Procedure Diagnosis Same as Pre-procedure Electronic Signature(s) Signed: 02/01/2021 6:30:22 PM By: Chad Abts RN, BSN Entered By: Chad [name] on 02/01/2021 14:22:40 -------------------------------------------------------------------------------- Compression Therapy Details Patient Name: Date of Service: Chad [name] [name] 02/01/2021 1:15 PM Medical Record Number: [id number] Patient Account Number: [id number] Date of Birth/Sex: Treating RN: 23-Aug-1966 (55 y.o. Male) Chad [name] Primary Care Leliana Kontz: PCP, NO Other Clinician: Referring Stacy Deshler: Treating [name]/Extender: [name] in Treatment: 0 Compression Therapy Performed for Wound Assessment: Wound #4 Right,Distal,Lateral Lower Leg Performed By: Clinician Chad Abts, RN Compression Type: Three Layer Post Procedure Diagnosis Same as Pre-procedure Electronic Signature(s) Signed: 02/01/2021 6:30:22 PM By: Chad Abts RN, BSN Entered By: Chad [name] on 02/01/2021 14:22:40 -------------------------------------------------------------------------------- Encounter  Discharge Information Details Patient Name: Date of Service: Chad [name] [facility] 02/01/2021 1:15 PM Medical Record Number: [id number] Patient Account Number: [id number] Date of Birth/Sex: Treating RN: 1966-01-05 (55 y.o. Male) Chad [name] Primary Care Azarel Banner: PCP, NO Other Clinician: Referring Trejan Buda: Treating Maniah Nading/Extender: [name] in Treatment: 0 Encounter Discharge Information Items Discharge Condition: Stable Ambulatory Status: Ambulatory Discharge Destination: Home Transportation: Private Auto Accompanied By: alone Schedule Follow-up Appointment: Yes Clinical Summary of Care: Patient Declined Electronic Signature(s) Signed: 02/01/2021 6:30:22 PM By: Chad Abts RN, BSN Entered By: Chad [name] on 02/01/2021 17:57:28 -------------------------------------------------------------------------------- Lower Extremity Assessment Details Patient Name: Date of Service: Chad [name] [facility] 02/01/2021 1:15 PM Medical Record Number: [id number] Patient Account Number: [id number] Date of Birth/Sex: Treating RN: 1966-08-11 (55 y.o. Male) Chad [name] Primary Care Onell Mcmath: PCP, NO Other Clinician: Referring Reizel Calzada: Treating Alle Difabio/Extender: [name] in Treatment: 0 Edema Assessment Assessed: [Left: No] [Right: No]  E[Left: dema] [Right: :] Calf Left: Right: Point of Measurement: 39 cm From Medial Instep 52.8 cm 53 cm Ankle Left: Right: Point of Measurement: 11 cm From Medial Instep 29 cm 29 cm Knee To Floor Left: Right: From Medial Instep 49 cm 49 cm Vascular Assessment Pulses: Dorsalis Pedis Palpable: [Left:Yes] [Right:Yes] Electronic Signature(s) Signed: 02/01/2021 6:30:22 PM By: Chad AbtsLynch, Shatara RN, BSN Entered By: Chad AbtsLynch, Shatara on 02/01/2021 14:03:17 -------------------------------------------------------------------------------- Multi-Disciplinary Care Plan Details Patient Name: Date of  [facility] Medical Record Number: [id number] Patient Account Number: [id number] Date of Birth/Sex: Treating RN: 02/13/1966 (55 y.o. Male) Chad AbtsLynch, Shatara Primary Care Amyra Vantuyl: PCP, NO Other Clinician: Referring Ednamae Schiano: Treating Vidyuth Belsito/Extender: Gaetano HawthorneHoffman, Jessica [name], [name]: 0 Multidisciplinary Care Plan reviewed with physician Active Inactive Venous Leg Ulcer Nursing Diagnoses: Actual venous Insuffiency (use after diagnosis is confirmed) Knowledge deficit related to disease process and management Goals: Patient will maintain optimal edema control Date Initiated: 02/01/2021 Target Resolution Date: 03/03/2021 Goal Status: Active Patient/caregiver will verbalize understanding of disease process and disease management Date Initiated: 02/01/2021 Target Resolution Date: 03/03/2021 Goal Status: Active Interventions: Assess peripheral edema status every visit. Compression as ordered Provide education on venous insufficiency Notes: Wound/Skin Impairment Nursing Diagnoses: Impaired tissue integrity Knowledge deficit related to ulceration/compromised skin integrity Goals: Patient/caregiver will verbalize understanding of skin care regimen Date Initiated: 02/01/2021 Target Resolution Date: 03/03/2021 Goal Status: Active Ulcer/skin breakdown will have a volume reduction of 30% by week 4 Date Initiated: 02/01/2021 Target Resolution Date: 03/03/2021 Goal Status: Active Interventions: Assess patient/caregiver ability to obtain necessary supplies Assess patient/caregiver ability to perform ulcer/skin care regimen upon admission and as needed Assess ulceration(s) every visit Provide education on ulcer and skin care Notes: Electronic Signature(s) Signed: 02/01/2021 6:30:22 PM By: Chad AbtsLynch, Shatara RN, BSN Entered By: Chad AbtsLynch, Shatara on 02/01/2021  14:21:30 -------------------------------------------------------------------------------- Pain Assessment Details Patient Name: Date of Service: Chad CitizenHO [place] [facility], [place] [name] 02/01/2021 1:15 PM Medical Record Number: [id number] Patient Account Number: [id number] Date of Birth/Sex: Treating RN: 03/09/1966 (55 y.o. Male) Chad AbtsLynch, Shatara Primary Care Kelvon Giannini: PCP, NO Other Clinician: Referring Aluel Schwarz: Treating Cezar Misiaszek/Extender: Gaetano HawthorneHoffman, Jessica [name], [name]: 0 Active Problems Location of Pain Severity and Description of Pain Patient Has Paino No Site Locations Pain Management and Medication Current Pain Management: Electronic Signature(s) Signed: 02/01/2021 6:30:22 PM By: Chad AbtsLynch, Shatara RN, BSN Entered By: Chad AbtsLynch, Shatara on 02/01/2021 14:07:24 -------------------------------------------------------------------------------- Patient/Caregiver Education Details Patient Name: Date of Service: Chad CitizenHO [place] S, [name] 5/18/2022andnbsp1:15 PM Medical Record Number: [id number] Patient Account Number: [id number] Date of Birth/Gender: Treating RN: 12/25/1965 (55 y.o. Male) Chad AbtsLynch, Shatara Primary Care Physician: PCP, NO Other Clinician: Referring Physician: Treating Physician/Extender: Gaetano HawthorneHoffman, Jessica [name], [name]: 0 Education Assessment Education Provided To: Patient Education Topics Provided Venous: Methods: Explain/Verbal Responses: State content correctly Wound/Skin Impairment: Methods: Explain/Verbal Responses: State content correctly Electronic Signature(s) Signed: 02/01/2021 6:30:22 PM By: Chad AbtsLynch, Shatara RN, BSN Entered By: Chad AbtsLynch, Shatara on 02/01/2021 14:21:39 -------------------------------------------------------------------------------- Wound Assessment Details Patient Name: Date of Service: Chad CitizenHO [facility] S, [place] [name] 02/01/2021 1:15 PM Medical Record Number: [id number] Patient Account Number: [id number] Date of Birth/Sex: Treating RN: 12/28/1965 (55  y.o. Male) Chad AbtsLynch, Shatara Primary Care Twilla Khouri: PCP, NO Other Clinician: Referring Abbi Mancini: Treating Ajeenah Heiny/Extender: Gaetano HawthorneHoffman, Jessica [name], [name]: 0 Wound Status Wound Number: 1 Primary Etiology: Venous Leg Ulcer Wound Location: Left, Distal, Anterior Lower Leg Wound Status: Open Wounding Event: Blister Comorbid History: Hypertension, Peripheral Venous Disease Date Acquired: 12/16/2020 Weeks Of  Treatment: 0 Clustered Wound: No Photos Wound Measurements Length: (cm) 0.8 Width: (cm) 0.8 Depth: (cm) 0.1 Area: (cm) 0.503 Volume: (cm) 0.05 % Reduction in Area: 0% % Reduction in Volume: 0% Epithelialization: None Tunneling: No Undermining: No Wound Description Classification: Full Thickness Without Exposed Support Structu Wound Margin: Flat and Intact Exudate Amount: Medium Exudate Type: Serosanguineous Exudate Color: red, brown res Foul Odor After Cleansing: No Slough/Fibrino No Wound Bed Granulation Amount: Large (67-100%) Exposed Structure Granulation Quality: Red Fascia Exposed: No Necrotic Amount: None Present (0%) Fat Layer (Subcutaneous Tissue) Exposed: Yes Tendon Exposed: No Muscle Exposed: No Joint Exposed: No Bone Exposed: No Treatment Notes Wound #1 (Lower Leg) Wound Laterality: Left, Anterior, Distal Cleanser Soap and Water Discharge Instruction: May shower and wash wound with dial antibacterial soap and water prior to dressing change. Peri-Wound Care Triamcinolone 15 (g) Discharge Instruction: Use triamcinolone 15 (g) as directed Sween Lotion (Moisturizing lotion) Discharge Instruction: Apply moisturizing lotion as directed Topical Primary Dressing Hydrofera Blue Classic Foam, 4x4 in Discharge Instruction: Moisten with saline prior to applying to wound bed Secondary Dressing Woven Gauze Sponge, Non-Sterile 4x4 in Discharge Instruction: Apply over primary dressing as directed. ABD Pad, 8x10 Discharge Instruction: Apply  over primary dressing as directed. Secured With Compression Wrap ThreePress (3 layer compression wrap) Discharge Instruction: Apply three layer compression as directed. Unna boot under wrap to help secure in place Compression Stockings Add-Ons Electronic Signature(s) Signed: 02/02/2021 4:57:25 PM By: Karl Ito Signed: 02/06/2021 5:27:53 PM By: Chad Abts RN, BSN Previous Signature: 02/01/2021 6:30:22 PM Version By: Chad Abts RN, BSN Entered By: Karl Ito on 02/02/2021 16:32:16 -------------------------------------------------------------------------------- Wound Assessment Details Patient Name: Date of Service: Chad [name] [facility] 02/01/2021 1:15 PM Medical Record Number: [id number] Patient Account Number: [id number] Date of Birth/Sex: Treating RN: Oct 27, 1965 (55 y.o. Male) Chad [name] Primary Care Chael Urenda: PCP, NO Other Clinician: Referring Kallum Jorgensen: Treating Karthika Glasper/Extender: [name] in Treatment: 0 Wound Status Wound Number: 2 Primary Etiology: Venous Leg Ulcer Wound Location: Left, Lateral Lower Leg Wound Status: Open Wounding Event: Blister Comorbid History: Hypertension, Peripheral Venous Disease Date Acquired: 12/16/2020 Weeks Of Treatment: 0 Clustered Wound: No Photos Wound Measurements Length: (cm) 10 Width: (cm) 4 Depth: (cm) 0.1 Area: (cm) 31.416 Volume: (cm) 3.142 % Reduction in Area: 0% % Reduction in Volume: 0% Epithelialization: None Tunneling: No Undermining: No Wound Description Classification: Full Thickness Without Exposed Support Structures Wound Margin: Flat and Intact Exudate Amount: Medium Exudate Type: Serosanguineous Exudate Color: red, brown Foul Odor After Cleansing: No Slough/Fibrino Yes Wound Bed Granulation Amount: Medium (34-66%) Exposed Structure Granulation Quality: Pink Fascia Exposed: No Necrotic Amount: Medium (34-66%) Fat Layer (Subcutaneous Tissue) Exposed:  Yes Necrotic Quality: Eschar, Adherent Slough Tendon Exposed: No Muscle Exposed: No Joint Exposed: No Bone Exposed: No Treatment Notes Wound #2 (Lower Leg) Wound Laterality: Left, Lateral Cleanser Soap and Water Discharge Instruction: May shower and wash wound with dial antibacterial soap and water prior to dressing change. Peri-Wound Care Triamcinolone 15 (g) Discharge Instruction: Use triamcinolone 15 (g) as directed Sween Lotion (Moisturizing lotion) Discharge Instruction: Apply moisturizing lotion as directed Topical Primary Dressing Hydrofera Blue Classic Foam, 4x4 in Discharge Instruction: Moisten with saline prior to applying to wound bed Secondary Dressing Woven Gauze Sponge, Non-Sterile 4x4 in Discharge Instruction: Apply over primary dressing as directed. ABD Pad, 8x10 Discharge Instruction: Apply over primary dressing as directed. Secured With Compression Wrap ThreePress (3 layer compression wrap) Discharge Instruction: Apply three layer compression as directed. Unna boot under wrap  to help secure in place Compression Stockings Add-Ons Electronic Signature(s) Signed: 02/02/2021 4:57:25 PM By: Karl Ito Signed: 02/06/2021 5:27:53 PM By: Chad Abts RN, BSN Previous Signature: 02/01/2021 6:30:22 PM Version By: Chad Abts RN, BSN Entered By: Karl Ito on 02/02/2021 16:32:45 -------------------------------------------------------------------------------- Wound Assessment Details Patient Name: Date of Service: Chad [name] [facility] 02/01/2021 1:15 PM Medical Record Number: [id number] Patient Account Number: [id number] Date of Birth/Sex: Treating RN: 07-Oct-1965 (55 y.o. Male) Chad [name] Primary Care Lorin Hauck: PCP, NO Other Clinician: Referring Argelio Granier: Treating Kreed Kauffman/Extender: [name] in Treatment: 0 Wound Status Wound Number: 3 Primary Etiology: Venous Leg Ulcer Wound Location: Right, Lateral Lower Leg Wound  Status: Open Wounding Event: Blister Comorbid History: Hypertension, Peripheral Venous Disease Date Acquired: 12/16/2020 Weeks Of Treatment: 0 Clustered Wound: No Photos Wound Measurements Length: (cm) 2.2 Width: (cm) 1.8 Depth: (cm) 0.1 Area: (cm) 3.11 Volume: (cm) 0.311 % Reduction in Area: 0% % Reduction in Volume: 0% Epithelialization: None Tunneling: No Undermining: No Wound Description Classification: Full Thickness Without Exposed Support Structu Wound Margin: Flat and Intact Exudate Amount: Medium Exudate Type: Serosanguineous Exudate Color: red, brown res Foul Odor After Cleansing: No Slough/Fibrino Yes Wound Bed Granulation Amount: Large (67-100%) Exposed Structure Granulation Quality: Red Fascia Exposed: No Necrotic Amount: Small (1-33%) Fat Layer (Subcutaneous Tissue) Exposed: Yes Necrotic Quality: Adherent Slough Tendon Exposed: No Muscle Exposed: No Joint Exposed: No Bone Exposed: No Treatment Notes Wound #3 (Lower Leg) Wound Laterality: Right, Lateral Cleanser Soap and Water Discharge Instruction: May shower and wash wound with dial antibacterial soap and water prior to dressing change. Peri-Wound Care Triamcinolone 15 (g) Discharge Instruction: Use triamcinolone 15 (g) as directed Sween Lotion (Moisturizing lotion) Discharge Instruction: Apply moisturizing lotion as directed Topical Primary Dressing Hydrofera Blue Classic Foam, 4x4 in Discharge Instruction: Moisten with saline prior to applying to wound bed Secondary Dressing Woven Gauze Sponge, Non-Sterile 4x4 in Discharge Instruction: Apply over primary dressing as directed. ABD Pad, 8x10 Discharge Instruction: Apply over primary dressing as directed. Secured With Compression Wrap ThreePress (3 layer compression wrap) Discharge Instruction: Apply three layer compression as directed. Unna boot under wrap to help secure in place Compression Stockings Add-Ons Electronic  Signature(s) Signed: 02/02/2021 4:57:25 PM By: Karl Ito Signed: 02/06/2021 5:27:53 PM By: Chad Abts RN, BSN Previous Signature: 02/01/2021 6:30:22 PM Version By: Chad Abts RN, BSN Entered By: Karl Ito on 02/02/2021 16:33:07 -------------------------------------------------------------------------------- Wound Assessment Details Patient Name: Date of Service: Chad [name] [facility] 02/01/2021 1:15 PM Medical Record Number: [id number] Patient Account Number: [id number] Date of Birth/Sex: Treating RN: April 08, 1966 (55 y.o. Male) Chad [name] Primary Care Rajat Staver: PCP, NO Other Clinician: Referring Asani Mcburney: Treating Aidric Endicott/Extender: [name] in Treatment: 0 Wound Status Wound Number: 4 Primary Etiology: Venous Leg Ulcer Wound Location: Right, Distal, Lateral Lower Leg Wound Status: Open Wounding Event: Blister Comorbid History: Hypertension, Peripheral Venous Disease Date Acquired: 12/16/2020 Weeks Of Treatment: 0 Clustered Wound: No Photos Wound Measurements Length: (cm) 0.5 Width: (cm) 0.5 Depth: (cm) 0.1 Area: (cm) 0.196 Volume: (cm) 0.02 % Reduction in Area: 0% % Reduction in Volume: 0% Epithelialization: None Tunneling: No Undermining: No Wound Description Classification: Full Thickness Without Exposed Support Structures Wound Margin: Flat and Intact Exudate Amount: Medium Exudate Type: Serosanguineous Exudate Color: red, brown Foul Odor After Cleansing: No Slough/Fibrino No Wound Bed Granulation Amount: Large (67-100%) Exposed Structure Granulation Quality: Pink Fascia Exposed: No Necrotic Amount: None Present (0%) Fat Layer (Subcutaneous Tissue) Exposed: Yes Tendon Exposed: No Muscle  Exposed: No Joint Exposed: No Bone Exposed: No Treatment Notes Wound #4 (Lower Leg) Wound Laterality: Right, Lateral, Distal Cleanser Soap and Water Discharge Instruction: May shower and wash wound with dial antibacterial  soap and water prior to dressing change. Peri-Wound Care Triamcinolone 15 (g) Discharge Instruction: Use triamcinolone 15 (g) as directed Sween Lotion (Moisturizing lotion) Discharge Instruction: Apply moisturizing lotion as directed Topical Primary Dressing Hydrofera Blue Classic Foam, 4x4 in Discharge Instruction: Moisten with saline prior to applying to wound bed Secondary Dressing Woven Gauze Sponge, Non-Sterile 4x4 in Discharge Instruction: Apply over primary dressing as directed. ABD Pad, 8x10 Discharge Instruction: Apply over primary dressing as directed. Secured With Compression Wrap ThreePress (3 layer compression wrap) Discharge Instruction: Apply three layer compression as directed. Unna boot under wrap to help secure in place Compression Stockings Add-Ons Electronic Signature(s) Signed: 02/02/2021 4:57:25 PM By: Karl Ito Signed: 02/06/2021 5:27:53 PM By: Chad Abts RN, BSN Previous Signature: 02/01/2021 6:30:22 PM Version By: Chad Abts RN, BSN Entered By: Karl Ito on 02/02/2021 16:33:31 -------------------------------------------------------------------------------- Vitals Details Patient Name: Date of Service: Chad [name] [facility] 02/01/2021 1:15 PM Medical Record Number: [id number] Patient Account Number: [id number] Date of Birth/Sex: Treating RN: 10-Jun-1966 (55 y.o. Male) Chad [name] Primary Care Kolyn Rozario: PCP, NO Other Clinician: Referring Greysen Devino: Treating Madisynn Plair/Extender: [name] in Treatment: 0 Vital Signs Time Taken: 13:33 Temperature (F): 98.0 Height (in): 76 Pulse (bpm): 100 Source: Stated Respiratory Rate (breaths/min): 18 Weight (lbs): 500 Blood Pressure (mmHg): 210/112 Source: Stated Reference Range: 80 - 120 mg / dl Body Mass Index (BMI): 60.9 Electronic Signature(s) Signed: 02/01/2021 6:30:22 PM By: Chad Abts RN, BSN Entered By: Chad [name] on 02/01/2021 13:47:38

## 2021-02-08 ENCOUNTER — Other Ambulatory Visit: Payer: Self-pay

## 2021-02-08 ENCOUNTER — Encounter (HOSPITAL_BASED_OUTPATIENT_CLINIC_OR_DEPARTMENT_OTHER): Payer: Federal, State, Local not specified - PPO | Admitting: Internal Medicine

## 2021-02-08 DIAGNOSIS — L97829 Non-pressure chronic ulcer of other part of left lower leg with unspecified severity: Secondary | ICD-10-CM | POA: Diagnosis not present

## 2021-02-08 DIAGNOSIS — I890 Lymphedema, not elsewhere classified: Secondary | ICD-10-CM

## 2021-02-08 DIAGNOSIS — I87323 Chronic venous hypertension (idiopathic) with inflammation of bilateral lower extremity: Secondary | ICD-10-CM | POA: Diagnosis not present

## 2021-02-08 DIAGNOSIS — L97819 Non-pressure chronic ulcer of other part of right lower leg with unspecified severity: Secondary | ICD-10-CM | POA: Diagnosis not present

## 2021-02-14 NOTE — Progress Notes (Signed)
Chad [name], Chad [name] ([id number]) Visit Report for 02/08/2021 Arrival Information Details Patient Name: Date of Service: Chad [name] Chad [name] [place] 02/08/2021 10:45 Chad [name]: [id number] Patient Account [name]: [id number] Date of Birth/Sex: Treating RN: 07-Dec-1965 (55 y.o. Chad [name] Primary Care Chad [name]: PCP, NO Other Clinician: Referring Chad [name]: Treating Tanja Gift/Extender: Chad [name] in Treatment: 1 Visit Information History Since Last Visit Added or deleted any medications: No Patient Arrived: Ambulatory Any new allergies or adverse reactions: No Arrival Time: 11:23 Had Chad fall or experienced change in No Accompanied By: self activities of daily living that may affect Transfer Assistance: None risk of falls: Patient Identification Verified: Yes Signs or symptoms of abuse/neglect since last visito No Secondary Verification Process Completed: Yes Hospitalized since last visit: No Patient Requires Transmission-Based Precautions: No Implantable device outside of the clinic excluding No Patient Has Alerts: Yes cellular tissue based products placed in the center Patient Alerts: R ABI: non compressible since last visit: L ABI: non compressible Has Dressing in Place as Prescribed: Yes Pain Present Now: No Electronic Signature(s) Signed: 02/08/2021 1:58:35 PM By: Chad [name] Entered By: Chad [name] on 02/08/2021 11:23:30 -------------------------------------------------------------------------------- Compression Therapy Details Patient Name: Date of Service: Chad [name] [facility] 02/08/2021 10:45 Chad [name]: [id number] Patient Account [name]: [id number] Date of Birth/Sex: Treating RN: Jan 20, 1966 (55 y.o. Chad [name] Primary Care Chad [name]: PCP, NO Other Clinician: Referring Chad [name]: Treating Kiannah Grunow/Extender: Chad [name] in Treatment: 1 Compression Therapy Performed for Wound Assessment:  Wound #2 Left,Lateral Lower Leg Performed By: Clinician Chad Mu, RN Compression Type: Three Layer Post Procedure Diagnosis Same as Pre-procedure Electronic Signature(s) Signed: 02/08/2021 4:28:15 PM By: Chad Deed RN, BSN Entered By: Chad [name] on 02/08/2021 11:49:17 -------------------------------------------------------------------------------- Compression Therapy Details Patient Name: Date of Service: Chad [name] [facility] 02/08/2021 10:45 Chad [name]: [id number] Patient Account [name]: [id number] Date of Birth/Sex: Treating RN: [birth year] (55 y.o. Chad [name] Primary Care Chad [name]: PCP, NO Other Clinician: Referring [name]: Treating Chad [name]/Extender: Chad [name] in Treatment: 1 Compression Therapy Performed for Wound Assessment: Wound #3 Right,Lateral Lower Leg Performed By: Clinician Chad Mu, RN Compression Type: Three Layer Post Procedure Diagnosis Same as Pre-procedure Electronic Signature(s) Signed: 02/08/2021 4:28:15 PM By: Chad Deed RN, BSN Entered By: Chad [name] on 02/08/2021 11:49:17 -------------------------------------------------------------------------------- Lower Extremity Assessment Details Patient Name: Date of Service: Chad [name] [facility] 02/08/2021 10:45 Chad [name]: [id number] Patient Account [name]: [id number] Date of Birth/Sex: Treating RN: Feb 21, 1966 (55 y.o. Chad [name] Primary Care Chad [name]: PCP, NO Other Clinician: Referring Chad [name]: Treating Chad [name]/Extender: Chad [name] in Treatment: 1 Edema Assessment Assessed: Chad [name]: Yes] Chad [name]: Yes] Edema: [Left: Yes] [Right: Yes] Calf Left: Right: Point of Measurement: 39 cm From Medial Instep 52.8 cm 53 cm Ankle Left: Right: Point of Measurement: 11 cm From Medial Instep 29 cm 29 cm Knee To Floor Left: Right: From Medial Instep 49 cm 49 cm Electronic  Signature(s) Signed: 02/08/2021 4:26:30 PM By: Chad Mu RN Signed: 02/08/2021 4:28:15 PM By: Chad Deed RN, BSN Entered By: Chad [name] on 02/08/2021 11:59:59 -------------------------------------------------------------------------------- Multi Wound Chart Details Patient Name: Date of Service: Chad [name] [facility] 02/08/2021 10:45 Chad [name]: [id number] Patient Account [name]: [id number] Date of Birth/Sex: Treating RN: 02-19-1966 (55 y.o. Chad [name] Primary Care Chad [name]: PCP, NO Other Clinician: Referring Chad [name]: Treating Chad [name]/Extender: Chad [name] in Treatment: 1 Vital Signs Height(in):  76 Pulse(bpm): 97 Weight(lbs): 500 Blood Pressure(mmHg): 196/83 Body Mass Index(BMI): 61 Temperature(F): 98.0 Respiratory Rate(breaths/min): 18 Photos: [1:No Photos Left, Distal, Anterior Lower Leg] [2:No Photos Left, Lateral Lower Leg] [3:No Photos Right, Lateral Lower Leg] Wound Location: [1:Blister] [2:Blister] [3:Blister] Wounding Event: [1:Venous Leg Ulcer] [2:Venous Leg Ulcer] [3:Venous Leg Ulcer] Primary Etiology: [1:N/Chad] [2:N/Chad] [3:Hypertension, Peripheral Venous] Comorbid History: [1:12/16/2020] [2:12/16/2020] [3:Disease 12/16/2020] Date Acquired: [1:1] [2:1] [3:1] Weeks of Treatment: [1:Open] [2:Open] [3:Open] Wound Status: [1:0x0x0] [2:1.5x2.5x0.1] [3:1x0.8x0.1] Measurements L x W x D (cm) [1:0] [2:2.945] [3:0.628] Chad (cm) : rea [1:0] [2:0.295] [3:0.063] Volume (cm) : [1:100.00%] [2:90.60%] [3:79.80%] % Reduction in Area: [1:100.00%] [2:90.60%] [3:79.70%] % Reduction in Volume: [1:Full Thickness Without Exposed] [2:Full Thickness Without Exposed] [3:Full Thickness Without Exposed] Classification: [1:Support Structures N/Chad] [2:Support Structures N/Chad] [3:Support Structures Medium] Exudate Chad mount: [1:N/Chad] [2:N/Chad] [3:Serosanguineous] Exudate Type: [1:N/Chad] [2:N/Chad] [3:red, brown] Exudate Color: [1:N/Chad] [2:N/Chad] [3:Flat  and Intact] Wound Margin: [1:N/Chad] [2:N/Chad] [3:Large (67-100%)] Granulation Chad mount: [1:N/Chad] [2:N/Chad] [3:Red] Granulation Quality: [1:N/Chad] [2:N/Chad] [3:Small (1-33%)] Necrotic Chad mount: [1:N/Chad] [2:N/Chad] [3:Small (1-33%)] Epithelialization: [1:N/Chad] [2:Debridement - Excisional] [3:Debridement - Excisional] Debridement: Pre-procedure Verification/Time Out N/Chad [2:11:50] [3:11:50] Taken: [1:N/Chad] [2:Other] [3:Other] Pain Control: [1:N/Chad] [2:Subcutaneous, Slough] [3:Subcutaneous, Slough] Tissue Debrided: [1:N/Chad] [2:Skin/Subcutaneous Tissue] [3:Skin/Subcutaneous Tissue] Level: [1:N/Chad] [2:3.75] [3:0.8] Debridement Chad (sq cm): [1:rea N/Chad] [2:Curette] [3:Curette] Instrument: [1:N/Chad] [2:Minimum] [3:Minimum] Bleeding: [1:N/Chad] [2:Pressure] [3:Pressure] Hemostasis Chad chieved: [1:N/Chad] [2:0] [3:0] Procedural Pain: [1:N/Chad] [2:0] [3:0] Post Procedural Pain: [1:N/Chad] [2:Procedure was tolerated well] [3:Procedure was tolerated well] Debridement Treatment Response: [1:N/Chad] [2:1.5x2.5x0.1] [3:1x0.8x0.1] Post Debridement Measurements L x W x D (cm) [1:N/Chad] [2:0.295] [3:0.063] Post Debridement Volume: (cm) [1:N/Chad] [2:Compression Therapy] [3:Compression Therapy] Procedures Performed: [2:Debridement 4 N/Chad] [3:Debridement N/Chad] Photos: [1:No Photos Right, Distal, Lateral Lower Leg] [2:N/Chad N/Chad] [3:N/Chad N/Chad] Wound Location: [1:Blister] [2:N/Chad] [3:N/Chad] Wounding Event: [1:Venous Leg Ulcer] [2:N/Chad] [3:N/Chad] Primary Etiology: [1:N/Chad] [2:N/Chad] [3:N/Chad] Comorbid History: [1:12/16/2020] [2:N/Chad] [3:N/Chad] Date Acquired: [1:1] [2:N/Chad] [3:N/Chad] Weeks of Treatment: [1:Open] [2:N/Chad] [3:N/Chad] Wound Status: [1:0x0x0] [2:N/Chad] [3:N/Chad] Measurements L x W x D (cm) [1:0] [2:N/Chad] [3:N/Chad] Chad (cm) : rea [1:0] [2:N/Chad] [3:N/Chad] Volume (cm) : [1:100.00%] [2:N/Chad] [3:N/Chad] % Reduction in Chad rea: [1:100.00%] [2:N/Chad] [3:N/Chad] % Reduction in Volume: [1:Full Thickness Without Exposed] [2:N/Chad] [3:N/Chad] Classification: [1:Support Structures N/Chad] [2:N/Chad]  [3:N/Chad] Exudate Chad mount: [1:N/Chad] [2:N/Chad] [3:N/Chad] Exudate Type: [1:N/Chad] [2:N/Chad] [3:N/Chad] Exudate Color: [1:N/Chad] [2:N/Chad] [3:N/Chad] Wound Margin: [1:N/Chad] [2:N/Chad] [3:N/Chad] Granulation Chad mount: [1:N/Chad] [2:N/Chad] [3:N/Chad] Granulation Quality: [1:N/Chad] [2:N/Chad] [3:N/Chad] Necrotic Chad mount: [1:N/Chad] [2:N/Chad] [3:N/Chad] Exposed Structures: [1:N/Chad] [2:N/Chad] [3:N/Chad] Epithelialization: [1:N/Chad] [2:N/Chad] [3:N/Chad] Debridement: [1:N/Chad] [2:N/Chad] [3:N/Chad] Pain Control: [1:N/Chad] [2:N/Chad] [3:N/Chad] Tissue Debrided: [1:N/Chad] [2:N/Chad] [3:N/Chad] Level: [1:N/Chad] [2:N/Chad] [3:N/Chad] Debridement Chad (sq cm): [1:rea N/Chad] [2:N/Chad] [3:N/Chad] Instrument: [1:N/Chad] [2:N/Chad] [3:N/Chad] Bleeding: [1:N/Chad] [2:N/Chad] [3:N/Chad] Hemostasis Chad chieved: [1:N/Chad] [2:N/Chad] [3:N/Chad] Procedural Pain: [1:N/Chad] [2:N/Chad] [3:N/Chad] Post Procedural Pain: [1:N/Chad] [2:N/Chad] [3:N/Chad] Debridement Treatment Response: [1:N/Chad] [2:N/Chad] [3:N/Chad] Post Debridement Measurements L x W x D (cm) [1:N/Chad] [2:N/Chad] [3:N/Chad] Post Debridement Volume: (cm) [1:N/Chad] [2:N/Chad] [3:N/Chad] Treatment Notes Electronic Signature(s) Signed: 02/08/2021 12:01:13 PM By: Geralyn CorwinHoffman, Jessica DO Signed: 02/08/2021 4:28:15 PM By: Chad DeedBoehlein, [name] RN, BSN Entered By: Geralyn CorwinHoffman, Jessica on 02/08/2021 11:57:10 -------------------------------------------------------------------------------- Pain Assessment Details Patient Name: Date of Service: Chad Citizen[name] 02/08/2021 10:45 Chad [name]: [id number] Patient Account [name]: [id number] Date of Birth/Sex: Treating RN: 09/16/1966 (55 y.o. Chad SchoonerM) [name], [name] Primary Care Shayle Donahoo: PCP, NO Other Clinician: Referring Nilay Mangrum: Treating Jerrett Baldinger/Extender: Chad HawthorneHoffman, Jessica [name], [name]: 1 Active Problems  Location of Pain Severity and Description of Pain Patient Has Paino No Site Locations Pain Management and Medication Current Pain Management: Electronic Signature(s) Signed: 02/08/2021 1:58:35 PM By: Chad [name] Signed: 02/08/2021 4:28:15 PM By:  Chad Deed RN, BSN Entered By: Chad [name] on 02/08/2021 11:23:59 -------------------------------------------------------------------------------- Wound Assessment Details Patient Name: Date of Service: Chad [name] [facility] 02/08/2021 10:45 Chad [name]: [id number] Patient Account [name]: [id number] Date of Birth/Sex: Treating RN: 06-25-1966 (55 y.o. Chad [name] Primary Care Detria Cummings: PCP, NO Other Clinician: Referring Ashar Lewinski: Treating Rayfield Beem/Extender: Chad [name] in Treatment: 1 Wound Status Wound [name]: 1 Primary Etiology: Venous Leg Ulcer Wound Location: Left, Distal, Anterior Lower Leg Wound Status: Open Wounding Event: Blister Date Acquired: 12/16/2020 Weeks Of Treatment: 1 Clustered Wound: No Wound Measurements Length: (cm) Width: (cm) Depth: (cm) Area: (cm) Volume: (cm) 0 % Reduction in Area: 100% 0 % Reduction in Volume: 100% 0 0 0 Wound Description Classification: Full Thickness Without Exposed Support Structur es Electronic Signature(s) Signed: 02/08/2021 1:58:35 PM By: Chad [name] Signed: 02/08/2021 4:28:15 PM By: Chad Deed RN, BSN Entered By: Chad [name] on 02/08/2021 11:33:07 -------------------------------------------------------------------------------- Wound Assessment Details Patient Name: Date of Service: Chad [name] [facility] 02/08/2021 10:45 Chad [name]: [id number] Patient Account [name]: [id number] Date of Birth/Sex: Treating RN: [birth year] (55 y.o. Chad [name] Primary Care Kearia Yin: PCP, NO Other Clinician: Referring Danilynn Jemison: Treating Jakari Sada/Extender: Chad [name] in Treatment: 1 Wound Status Wound [name]: 2 Primary Etiology: Venous Leg Ulcer Wound Location: Left, Lateral Lower Leg Wound Status: Open Wounding Event: Blister Comorbid History: Hypertension, Peripheral Venous Disease Date Acquired: 12/16/2020 Weeks Of  Treatment: 1 Clustered Wound: No Photos Wound Measurements Length: (cm) 1.5 Width: (cm) 2.5 Depth: (cm) 0.1 Area: (cm) 2.945 Volume: (cm) 0.295 % Reduction in Area: 90.6% % Reduction in Volume: 90.6% Epithelialization: None Wound Description Classification: Full Thickness Without Exposed Support Structures Wound Margin: Flat and Intact Exudate Amount: Medium Exudate Type: Serosanguineous Exudate Color: red, brown Foul Odor After Cleansing: No Slough/Fibrino Yes Wound Bed Granulation Amount: Medium (34-66%) Exposed Structure Granulation Quality: Pink Fascia Exposed: No Necrotic Amount: Medium (34-66%) Fat Layer (Subcutaneous Tissue) Exposed: Yes Necrotic Quality: Eschar, Adherent Slough Tendon Exposed: No Muscle Exposed: No Joint Exposed: No Bone Exposed: No Electronic Signature(s) Signed: 02/08/2021 4:28:15 PM By: Chad Deed RN, BSN Signed: 02/14/2021 5:24:49 PM By: Chad [name] Previous Signature: 02/08/2021 1:58:35 PM Version By: Chad [name] Entered By: Chad [name] on 02/08/2021 16:05:47 -------------------------------------------------------------------------------- Wound Assessment Details Patient Name: Date of Service: Chad [name] [facility] 02/08/2021 10:45 Chad [name]: [id number] Patient Account [name]: [id number] Date of Birth/Sex: Treating RN: [birth year] (55 y.o. Chad [name] Primary Care Kemal Amores: PCP, NO Other Clinician: Referring Rice Walsh: Treating Dezi Brauner/Extender: Chad [name] in Treatment: 1 Wound Status Wound [name]: 3 Primary Etiology: Venous Leg Ulcer Wound Location: Right, Lateral Lower Leg Wound Status: Open Wounding Event: Blister Comorbid History: Hypertension, Peripheral Venous Disease Date Acquired: 12/16/2020 Weeks Of Treatment: 1 Clustered Wound: No Photos Wound Measurements Length: (cm) 1 Width: (cm) 0.8 Depth: (cm) 0.1 Area: (cm) 0.628 Volume: (cm) 0.063 % Reduction  in Area: 79.8% % Reduction in Volume: 79.7% Epithelialization: Small (1-33%) Tunneling: No Undermining: No Wound Description Classification: Full Thickness Without Exposed Support Structures Wound Margin: Flat and Intact Exudate Amount: Medium Exudate Type: Serosanguineous Exudate Color: red, brown Foul Odor After Cleansing: No Slough/Fibrino Yes Wound Bed Granulation Amount: Large (67-100%) Exposed Structure Granulation Quality:  Red Fascia Exposed: No Necrotic Amount: Small (1-33%) Fat Layer (Subcutaneous Tissue) Exposed: Yes Necrotic Quality: Adherent Slough Tendon Exposed: No Muscle Exposed: No Joint Exposed: No Bone Exposed: No Electronic Signature(s) Signed: 02/08/2021 4:28:15 PM By: Chad Deed RN, BSN Signed: 02/14/2021 5:24:49 PM By: Chad [name] Entered By: Chad [name] on 02/08/2021 16:05:23 -------------------------------------------------------------------------------- Wound Assessment Details Patient Name: Date of Service: Chad [name] [facility] 02/08/2021 10:45 Chad [name]: [id number] Patient Account [name]: [id number] Date of Birth/Sex: Treating RN: 05-07-1966 (55 y.o. Chad [name] Primary Care Raidon Swanner: PCP, NO Other Clinician: Referring Iliza Blankenbeckler: Treating Mykeria Garman/Extender: Chad [name] in Treatment: 1 Wound Status Wound [name]: 4 Primary Etiology: Venous Leg Ulcer Wound Location: Right, Distal, Lateral Lower Leg Wound Status: Open Wounding Event: Blister Date Acquired: 12/16/2020 Weeks Of Treatment: 1 Clustered Wound: No Wound Measurements Length: (cm) Width: (cm) Depth: (cm) Area: (cm) Volume: (cm) Wound Description Classification: Full Thickness Without Exposed Support Structur es 0 % Reduction in Area: 100% 0 % Reduction in Volume: 100% 0 0 0 Electronic Signature(s) Signed: 02/08/2021 1:58:35 PM By: Chad [name] Signed: 02/08/2021 4:28:15 PM By: Chad Deed RN, BSN Entered By:  Chad [name] on 02/08/2021 11:33:07 -------------------------------------------------------------------------------- Vitals Details Patient Name: Date of Service: Chad [name] [facility] 02/08/2021 10:45 Chad [name]: [id number] Patient Account [name]: [id number] Date of Birth/Sex: Treating RN: [birth year] (55 y.o. Chad [name] Primary Care Victoria Henshaw: PCP, NO Other Clinician: Referring Orion Vandervort: Treating Parris Signer/Extender: Chad [name] in Treatment: 1 Vital Signs Time Taken: 11:23 Temperature (F): 98.0 Height (in): 76 Pulse (bpm): 97 Weight (lbs): 500 Respiratory Rate (breaths/min): 18 Body Mass Index (BMI): 60.9 Blood Pressure (mmHg): 196/83 Reference Range: 80 - 120 mg / dl Electronic Signature(s) Signed: 02/08/2021 1:58:35 PM By: Chad [name] Entered By: Chad [name] on 02/08/2021 11:23:51

## 2021-02-15 ENCOUNTER — Other Ambulatory Visit: Payer: Self-pay

## 2021-02-15 ENCOUNTER — Encounter (HOSPITAL_BASED_OUTPATIENT_CLINIC_OR_DEPARTMENT_OTHER): Payer: Federal, State, Local not specified - PPO | Attending: Internal Medicine | Admitting: Internal Medicine

## 2021-02-15 DIAGNOSIS — L97819 Non-pressure chronic ulcer of other part of right lower leg with unspecified severity: Secondary | ICD-10-CM | POA: Insufficient documentation

## 2021-02-15 DIAGNOSIS — I890 Lymphedema, not elsewhere classified: Secondary | ICD-10-CM | POA: Insufficient documentation

## 2021-02-15 DIAGNOSIS — L97829 Non-pressure chronic ulcer of other part of left lower leg with unspecified severity: Secondary | ICD-10-CM | POA: Insufficient documentation

## 2021-02-15 DIAGNOSIS — I872 Venous insufficiency (chronic) (peripheral): Secondary | ICD-10-CM | POA: Diagnosis not present

## 2021-02-15 NOTE — Progress Notes (Signed)
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 [id number] Date of Birth/Sex: Treating RN: 1966/04/01 (55 y.o. Chad [name] Primary Care Provider: PCP, NO Other Clinician: Referring Provider: Treating Provider/Extender: Chad [name] in Treatment: 1 Information Obtained From Patient Cardiovascular Medical History: Positive for: Hypertension; Peripheral Venous Disease Immunizations Pneumococcal Vaccine: Received Pneumococcal Vaccination: No Implantable Devices None Family and Social History Unknown History: Yes; Never smoker; Marital Status - Married; Alcohol Use:  Rarely; Drug Use: No History; Caffeine Use: Rarely; Financial Concerns: No; Food, Clothing or Shelter Needs: No; Support System Lacking: No; Transportation Concerns: No Electronic Signature(s) Signed: 02/08/2021 12:01:13 PM By: Chad Corwin DO Signed: 02/08/2021 4:28:15 PM By: Chad Deed RN, BSN Entered By: Chad [name] on 02/08/2021  11:58:08 -------------------------------------------------------------------------------- SuperBill Details Patient Name: Date of Service: Chad [name] [facility] 02/08/2021 Medical Record Number: [id number] Patient Account Number: [id number] Date of Birth/Sex: Treating RN: 09/05/1966 (55 y.o. 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 I89.0 Lymphedema, not elsewhere classified Facility Procedures CPT4 Code: [id number] Description: 11042 - DEB SUBQ TISSUE 20 SQ CM/< ICD-10 Diagnosis Description L97.819 Non-pressure chronic ulcer of other part of right lower leg with unspecified s L97.829 Non-pressure chronic ulcer of other part of left lower leg with unspecified se  I87.323 Chronic venous hypertension (idiopathic) with inflammation of bilateral lower I89.0 Lymphedema, not elsewhere classified Modifier: everity verity extremity Quantity: 1 Physician Procedures : CPT4 Code Description Modifier [id number] 11042 - WC PHYS SUBQ TISS 20 SQ CM ICD-10 Diagnosis Description L97.819 Non-pressure chronic ulcer of other part of right lower leg with unspecified severity L97.829 Non-pressure chronic ulcer of other part of  left lower leg with unspecified severity I87.323 Chronic venous hypertension (idiopathic) with inflammation of bilateral lower extremity I89.0 Lymphedema, not elsewhere classified Quantity: 1 Electronic Signature(s) Signed: 02/08/2021 12:01:13 PM By: Chad Corwin DO Entered By: Chad [name] on 02/08/2021 12:00:51

## 2021-02-15 NOTE — Progress Notes (Signed)
FRANZ, SVEC ([id number]) Visit Report for 02/15/2021 Arrival Information Details Patient Name: Date of Service: Chad [name] [facility] 02/15/2021 11:30 A M Medical Record Number: [id number] Patient Account Number: [id number] Date of Birth/Sex: Treating RN: 07-Apr-1966 (55 y.o. Lytle Michaels Primary Care Dilcia Rybarczyk: PCP, NO Other Clinician: Referring Shreyas Piatkowski: Treating Javona Bergevin/Extender: [name] in Treatment: 2 Visit Information History Since Last Visit Added or deleted any medications: No Patient Arrived: Ambulatory Any new allergies or adverse reactions: No Arrival Time: 11:45 Had a fall or experienced change in No Transfer Assistance: None activities of daily living that may affect Patient Identification Verified: Yes risk of falls: Secondary Verification Process Completed: Yes Signs or symptoms of abuse/neglect since last visito No Patient Requires Transmission-Based Precautions: No Hospitalized since last visit: No Patient Has Alerts: Yes Implantable device outside of the clinic excluding No Patient Alerts: R ABI: non compressible cellular tissue based products placed in the center L ABI: non compressible since last visit: Has Dressing in Place as Prescribed: Yes Has Compression in Place as Prescribed: Yes Pain Present Now: No Electronic Signature(s) Signed: 02/15/2021 5:37:40 PM By: Antonieta Iba Entered By: Antonieta Iba on 02/15/2021 11:45:40 -------------------------------------------------------------------------------- Compression Therapy Details Patient Name: Date of Service: Chad [name] [facility] 02/15/2021 11:30 A M Medical Record Number: [id number] Patient Account Number: [id number] Date of Birth/Sex: Treating RN: [birth year] (55 y.o. Lytle Michaels Primary Care Cortlyn Cannell: PCP, NO Other Clinician: Referring Ariannie Penaloza: Treating Obelia Bonello/Extender: [name] in Treatment: 2 Compression Therapy Performed for Wound  Assessment: Wound #2 Left,Lateral Lower Leg Performed By: Clinician Antonieta Iba, RN Compression Type: Three Layer Electronic Signature(s) Signed: 02/15/2021 1:21:54 PM By: Antonieta Iba Entered By: Antonieta Iba on 02/15/2021 13:21:54 -------------------------------------------------------------------------------- Compression Therapy Details Patient Name: Date of Service: Chad [name] [facility] 02/15/2021 11:30 A M Medical Record Number: [id number] Patient Account Number: [id number] Date of Birth/Sex: Treating RN: 04/14/1966 (55 y.o. Lytle Michaels Primary Care Zavien Clubb: PCP, NO Other Clinician: Referring Starlette Thurow: Treating Sladen Plancarte/Extender: [name] in Treatment: 2 Compression Therapy Performed for Wound Assessment: Wound #3 Right,Lateral Lower Leg Performed By: Clinician Antonieta Iba, RN Compression Type: Three Layer Electronic Signature(s) Signed: 02/15/2021 1:21:54 PM By: Antonieta Iba Entered By: Antonieta Iba on 02/15/2021 13:21:54 -------------------------------------------------------------------------------- Encounter Discharge Information Details Patient Name: Date of Service: Chad [name] [facility] 02/15/2021 11:30 A M Medical Record Number: [id number] Patient Account Number: [id number] Date of Birth/Sex: Treating RN: 23-Aug-1966 (55 y.o. Lytle Michaels Primary Care Maribeth Jiles: PCP, NO Other Clinician: Referring Madlynn Lundeen: Treating Isis Costanza/Extender: [name] in Treatment: 2 Encounter Discharge Information Items Discharge Condition: Stable Ambulatory Status: Ambulatory Discharge Destination: Home Transportation: Private Auto Schedule Follow-up Appointment: Yes Clinical Summary of Care: Provided on 02/15/2021 Form Type Recipient Paper Patient Patient Electronic Signature(s) Signed: 02/15/2021 1:22:40 PM By: Antonieta Iba Entered By: Antonieta Iba on 02/15/2021  13:22:40 -------------------------------------------------------------------------------- Patient/Caregiver Education Details Patient Name: Date of Service: Chad [name] [name] 6/1/2022andnbsp11:30 A M Medical Record Number: [id number] Patient Account Number: [id number] Date of Birth/Gender: Treating RN: 1966/09/15 (55 y.o. Lytle Michaels Primary Care Physician: PCP, NO Other Clinician: Referring Physician: Treating Physician/Extender: [name] in Treatment: 2 Education Assessment Education Provided To: Patient Education Topics Provided Venous: Methods: Explain/Verbal, Printed Responses: State content correctly Wound/Skin Impairment: Methods: Explain/Verbal, Printed Responses: State content correctly Electronic Signature(s) Signed: 02/15/2021 5:37:40 PM By: Antonieta Iba Entered By: Antonieta Iba on 02/15/2021 13:22:23 -------------------------------------------------------------------------------- Wound Assessment Details Patient Name:  Date of Service: Chad [name] [facility] 02/15/2021 11:30 A M Medical Record Number: [id number] Patient Account Number: [id number] Date of Birth/Sex: Treating RN: 1966-06-15 (55 y.o. Lytle Michaels Primary Care Ellyce Lafevers: PCP, NO Other Clinician: Referring Darya Bigler: Treating Wrenn Willcox/Extender: [name] in Treatment: 2 Wound Status Wound Number: 2 Primary Etiology: Venous Leg Ulcer Wound Location: Left, Lateral Lower Leg Wound Status: Open Wounding Event: Blister Comorbid History: Hypertension, Peripheral Venous Disease Date Acquired: 12/16/2020 Weeks Of Treatment: 2 Clustered Wound: No Wound Measurements Length: (cm) 0.5 Width: (cm) 0.5 Depth: (cm) 0.1 Area: (cm) 0.196 Volume: (cm) 0.02 % Reduction in Area: 99.4% % Reduction in Volume: 99.4% Epithelialization: None Tunneling: No Undermining: No Wound Description Classification: Full Thickness Without Exposed Support  Structures Wound Margin: Distinct, outline attached Exudate Amount: Medium Exudate Type: Serosanguineous Exudate Color: red, brown Foul Odor After Cleansing: No Slough/Fibrino No Wound Bed Granulation Amount: Large (67-100%) Exposed Structure Granulation Quality: Pink Fascia Exposed: No Necrotic Amount: None Present (0%) Fat Layer (Subcutaneous Tissue) Exposed: Yes Tendon Exposed: No Muscle Exposed: No Joint Exposed: No Bone Exposed: No Treatment Notes Wound #2 (Lower Leg) Wound Laterality: Left, Lateral Cleanser Soap and Water Discharge Instruction: May shower and wash wound with dial antibacterial soap and water prior to dressing change. Peri-Wound Care Triamcinolone 15 (g) Discharge Instruction: Use triamcinolone 15 (g) as directed Sween Lotion (Moisturizing lotion) Discharge Instruction: Apply moisturizing lotion as directed Topical Primary Dressing Hydrofera Blue Classic Foam, 4x4 in Discharge Instruction: Moisten with saline prior to applying to wound bed Secondary Dressing Woven Gauze Sponge, Non-Sterile 4x4 in Discharge Instruction: Apply over primary dressing as directed. ABD Pad, 8x10 Discharge Instruction: Apply over primary dressing as directed. Secured With Compression Wrap ThreePress (3 layer compression wrap) Discharge Instruction: Apply three layer compression as directed. Unna boot under wrap to help secure in place Compression Stockings Circaid Juxta Lite Compression Wrap Quantity: 1 Left Leg Compression Amount: 30-40 mmHg Discharge Instruction: Apply Circaid Juxta Lite Compression Wrap daily as instructed. Apply first thing in the morning, remove at night before bed. Add-Ons Electronic Signature(s) Signed: 02/15/2021 1:21:35 PM By: Antonieta Iba Previous Signature: 02/15/2021 1:20:52 PM Version By: Antonieta Iba Entered By: Antonieta Iba on 02/15/2021 13:21:35 -------------------------------------------------------------------------------- Wound  Assessment Details Patient Name: Date of Service: Chad [name] [facility] 02/15/2021 11:30 A M Medical Record Number: [id number] Patient Account Number: [id number] Date of Birth/Sex: Treating RN: 02-28-1966 (55 y.o. Lytle Michaels Primary Care Sherrol Vicars: PCP, NO Other Clinician: Referring Keyetta Hollingworth: Treating Saron Tweed/Extender: [name] in Treatment: 2 Wound Status Wound Number: 3 Primary Etiology: Venous Leg Ulcer Wound Location: Right, Lateral Lower Leg Wound Status: Open Wounding Event: Blister Comorbid History: Hypertension, Peripheral Venous Disease Date Acquired: 12/16/2020 Weeks Of Treatment: 2 Clustered Wound: No Wound Measurements Length: (cm) 1 Width: (cm) 0.8 Depth: (cm) 0.1 Area: (cm) 0.628 Volume: (cm) 0.063 % Reduction in Area: 79.8% % Reduction in Volume: 79.7% Epithelialization: Small (1-33%) Tunneling: No Undermining: No Wound Description Classification: Full Thickness Without Exposed Support Structures Wound Margin: Distinct, outline attached Exudate Amount: Medium Exudate Type: Serosanguineous Exudate Color: red, brown Foul Odor After Cleansing: No Slough/Fibrino No Wound Bed Granulation Amount: Large (67-100%) Exposed Structure Granulation Quality: Red Fascia Exposed: No Necrotic Amount: None Present (0%) Fat Layer (Subcutaneous Tissue) Exposed: Yes Tendon Exposed: No Muscle Exposed: No Joint Exposed: No Bone Exposed: No Treatment Notes Wound #3 (Lower Leg) Wound Laterality: Right, Lateral Cleanser Soap and Water Discharge Instruction: May shower and wash wound with  dial antibacterial soap and water prior to dressing change. Peri-Wound Care Triamcinolone 15 (g) Discharge Instruction: Use triamcinolone 15 (g) as directed Sween Lotion (Moisturizing lotion) Discharge Instruction: Apply moisturizing lotion as directed Topical Primary Dressing Hydrofera Blue Classic Foam, 4x4 in Discharge Instruction: Moisten with  saline prior to applying to wound bed Secondary Dressing Woven Gauze Sponge, Non-Sterile 4x4 in Discharge Instruction: Apply over primary dressing as directed. ABD Pad, 8x10 Discharge Instruction: Apply over primary dressing as directed. Secured With Compression Wrap ThreePress (3 layer compression wrap) Discharge Instruction: Apply three layer compression as directed. Unna boot under wrap to help secure in place Compression Stockings Circaid Juxta Lite Compression Wrap Quantity: 1 Right Leg Compression Amount: 30-40 mmHg Discharge Instruction: Apply Circaid Juxta Lite Compression Wrap daily as instructed. Apply first thing in the morning, remove at night before bed. Add-Ons Electronic Signature(s) Signed: 02/15/2021 1:21:20 PM By: Antonieta Iba Entered By: Antonieta Iba on 02/15/2021 13:21:20 -------------------------------------------------------------------------------- Vitals Details Patient Name: Date of Service: Chad [name] [facility] 02/15/2021 11:30 A M Medical Record Number: [id number] Patient Account Number: [id number] Date of Birth/Sex: Treating RN: 1966/04/06 (55 y.o. Lytle Michaels Primary Care Naftula Donahue: PCP, NO Other Clinician: Referring Vanden Fawaz: Treating Charmaine Placido/Extender: [name] in Treatment: 2 Vital Signs Time Taken: 11:45 Temperature (F): 98.2 Height (in): 76 Pulse (bpm): 90 Weight (lbs): 500 Respiratory Rate (breaths/min): 20 Body Mass Index (BMI): 60.9 Blood Pressure (mmHg): 180/80 Reference Range: 80 - 120 mg / dl Electronic Signature(s) Signed: 02/15/2021 5:37:40 PM By: Antonieta Iba Entered By: Antonieta Iba on 02/15/2021 11:45:57

## 2021-02-15 NOTE — Progress Notes (Signed)
[name], [name] ([id number]) Visit Report for 02/15/2021 SuperBill Details Patient Name: Date of Service: Chad [name] [facility] 02/15/2021 Medical Record Number: [id number] Patient Account Number: [id number] Date of Birth/Sex: Treating RN: 29-Apr-1966 (55 y.o. 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

## 2021-02-22 ENCOUNTER — Other Ambulatory Visit: Payer: Self-pay

## 2021-02-22 ENCOUNTER — Encounter (HOSPITAL_BASED_OUTPATIENT_CLINIC_OR_DEPARTMENT_OTHER): Payer: Federal, State, Local not specified - PPO | Admitting: Internal Medicine

## 2021-02-22 DIAGNOSIS — L97829 Non-pressure chronic ulcer of other part of left lower leg with unspecified severity: Secondary | ICD-10-CM | POA: Diagnosis not present

## 2021-02-22 NOTE — Progress Notes (Signed)
[name], [name] ([id number]) Visit Report for 02/22/2021 Arrival Information Details Patient Name: Date of Service: Chad [name] [facility] 02/22/2021 11:15 A M Medical Record Number: [id number] Patient Account Number: [id number] Date of Birth/Sex: Treating RN: 08/17/1966 (55 y.o. Chad [name] Primary Care Monchel Pollitt: PCP, NO Other Clinician: Referring Taiwo Fish: Treating Jayziah Bankhead/Extender: [name] in Treatment: 3 Visit Information History Since Last Visit Added or deleted any medications: No Patient Arrived: Ambulatory Any new allergies or adverse reactions: No Arrival Time: 12:01 Had a fall or experienced change in No Accompanied By: self activities of daily living that may affect Transfer Assistance: None risk of falls: Patient Identification Verified: Yes Signs or symptoms of abuse/neglect since last visito No Secondary Verification Process Completed: Yes Hospitalized since last visit: No Patient Requires Transmission-Based Precautions: No Implantable device outside of the clinic excluding No Patient Has Alerts: Yes cellular tissue based products placed in the center Patient Alerts: R ABI: non compressible since last visit: L ABI: non compressible Has Dressing in Place as Prescribed: Yes Pain Present Now: No Electronic Signature(s) Signed: 02/22/2021 5:19:11 PM By: Karl Ito Entered By: Karl Ito on 02/22/2021 12:02:13 -------------------------------------------------------------------------------- Compression Therapy Details Patient Name: Date of Service: Chad [name] 02/22/2021 11:15 A M Medical Record Number: [id number] Patient Account Number: [id number] Date of Birth/Sex: Treating RN: 12-11-1965 (55 y.o. Chad [name] Primary Care Renatha Rosen: PCP, NO Other Clinician: Referring Jaxson Anglin: Treating Krystall Kruckenberg/Extender: [name] in Treatment: 3 Compression Therapy Performed for Wound Assessment: Wound  #3 Right,Lateral Lower Leg Performed By: Clinician [name], RN Compression Type: Three Layer Post Procedure Diagnosis Same as Pre-procedure Electronic Signature(s) Signed: 02/22/2021 6:12:56 PM By: [name] RN, BSN Entered By: [name] on 02/22/2021 12:25:48 -------------------------------------------------------------------------------- Compression Therapy Details Patient Name: Date of Service: Chad [name] [facility] 02/22/2021 11:15 A M Medical Record Number: [id number] Patient Account Number: [id number] Date of Birth/Sex: Treating RN: 1965/11/07 (55 y.o. Chad [name] Primary Care Waldron Gerry: PCP, NO Other Clinician: Referring Klein Willcox: Treating Keshia Weare/Extender: [name] in Treatment: 3 Compression Therapy Performed for Wound Assessment: NonWound Condition Lymphedema - Left Leg Performed By: Clinician [name], RN Compression Type: Three Layer Post Procedure Diagnosis Same as Pre-procedure Electronic Signature(s) Signed: 02/22/2021 6:12:56 PM By: [name] RN, BSN Entered By: [name] on 02/22/2021 12:27:57 -------------------------------------------------------------------------------- Lower Extremity Assessment Details Patient Name: Date of Service: Chad [name] [facility] 02/22/2021 11:15 A M Medical Record Number: [id number] Patient Account Number: [id number] Date of Birth/Sex: Treating RN: [birth year] (55 y.o. Chad [name] Primary Care Jeremiah Curci: PCP, NO Other Clinician: Referring Mattisyn Cardona: Treating Tlaloc Taddei/Extender: [name] in Treatment: 3 Edema Assessment Assessed: [name]: Yes] [Right: Yes] Edema: [Left: Yes] [Right: Yes] Calf Left: Right: Point of Measurement: 39 cm From Medial Instep 55 cm 55 cm Ankle Left: Right: Point of Measurement: 11 cm From Medial Instep 28 cm 29 cm Vascular Assessment Pulses: Dorsalis Pedis Palpable: [Left:Yes] [Right:Yes] Electronic  Signature(s) Signed: 02/22/2021 6:05:11 PM By: Shawn Stall Entered By: Shawn Stall on 02/22/2021 12:15:34 -------------------------------------------------------------------------------- Multi Wound Chart Details Patient Name: Date of Service: Chad [name] [facility] 02/22/2021 11:15 A M Medical Record Number: [id number] Patient Account Number: [id number] Date of Birth/Sex: Treating RN: 04-08-1966 (55 y.o. Chad [name] Primary Care Kevonte Vanecek: PCP, NO Other Clinician: Referring Raelynn Corron: Treating Kendan Cornforth/Extender: [name] in Treatment: 3 Vital Signs Height(in): 76 Pulse(bpm): 105 Weight(lbs): 500 Blood Pressure(mmHg): 200/103 Body Mass Index(BMI): 61 Temperature(F):  98.1 Respiratory Rate(breaths/min): 20 Photos: [2:No Photos Left, Lateral Lower Leg] [3:No Photos Right, Lateral Lower Leg] [N/A:N/A N/A] Wound Location: [2:Blister] [3:Blister] [N/A:N/A] Wounding Event: [2:Venous Leg Ulcer] [3:Venous Leg Ulcer] [N/A:N/A] Primary Etiology: [2:N/A] [3:Hypertension, Peripheral Venous] [N/A:N/A] Comorbid History: [2:12/16/2020] [3:Disease 12/16/2020] [N/A:N/A] Date Acquired: [2:3] [3:3] [N/A:N/A] Weeks of Treatment: [2:Open] [3:Open] [N/A:N/A] Wound Status: [2:0x0x0] [3:1x0.9x0.1] [N/A:N/A] Measurements L x W x D (cm) [2:0] [3:0.707] [N/A:N/A] A (cm) : rea [2:0] [3:0.071] [N/A:N/A] Volume (cm) : [2:100.00%] [3:77.30%] [N/A:N/A] % Reduction in Area: [2:100.00%] [3:77.20%] [N/A:N/A] % Reduction in Volume: [2:Full Thickness Without Exposed] [3:Full Thickness Without Exposed] [N/A:N/A] Classification: [2:Support Structures N/A] [3:Support Structures Medium] [N/A:N/A] Exudate A mount: [2:N/A] [3:Serosanguineous] [N/A:N/A] Exudate Type: [2:N/A] [3:red, brown] [N/A:N/A] Exudate Color: [2:N/A] [3:Distinct, outline attached] [N/A:N/A] Wound Margin: [2:N/A] [3:Large (67-100%)] [N/A:N/A] Granulation A mount: [2:N/A] [3:Red] [N/A:N/A] Granulation Quality: [2:N/A]  [3:None Present (0%)] [N/A:N/A] Necrotic A mount: [2:N/A] [3:Large (67-100%)] [N/A:N/A] Epithelialization: [2:N/A] [3:Compression Therapy] [N/A:N/A] Treatment Notes Electronic Signature(s) Signed: 02/22/2021 2:23:59 PM By: Geralyn Corwin DO Signed: 02/22/2021 6:12:56 PM By: [name] RN, BSN Entered By: Geralyn Corwin on 02/22/2021 14:17:11 -------------------------------------------------------------------------------- Multi-Disciplinary Care Plan Details Patient Name: Date of Service: Chad [name] [facility] 02/22/2021 11:15 A M Medical Record Number: [id number] Patient Account Number: [id number] Date of Birth/Sex: Treating RN: 1966/03/09 (55 y.o. Chad [name] Primary Care Magon Croson: PCP, NO Other Clinician: Referring Tamerra Merkley: Treating Zani Kyllonen/Extender: [name] in Treatment: 3 Multidisciplinary Care Plan reviewed with physician Active Inactive Venous Leg Ulcer Nursing Diagnoses: Actual venous Insuffiency (use after diagnosis is confirmed) Knowledge deficit related to disease process and management Goals: Patient will maintain optimal edema control Date Initiated: 02/01/2021 Target Resolution Date: 03/03/2021 Goal Status: Active Patient/caregiver will verbalize understanding of disease process and disease management Date Initiated: 02/01/2021 Target Resolution Date: 03/03/2021 Goal Status: Active Interventions: Assess peripheral edema status every visit. Compression as ordered Provide education on venous insufficiency Notes: Wound/Skin Impairment Nursing Diagnoses: Impaired tissue integrity Knowledge deficit related to ulceration/compromised skin integrity Goals: Patient/caregiver will verbalize understanding of skin care regimen Date Initiated: 02/01/2021 Target Resolution Date: 03/03/2021 Goal Status: Active Ulcer/skin breakdown will have a volume reduction of 30% by week 4 Date Initiated: 02/01/2021 Target Resolution Date:  03/03/2021 Goal Status: Active Interventions: Assess patient/caregiver ability to obtain necessary supplies Assess patient/caregiver ability to perform ulcer/skin care regimen upon admission and as needed Assess ulceration(s) every visit Provide education on ulcer and skin care Notes: Electronic Signature(s) Signed: 02/22/2021 6:12:56 PM By: [name] RN, BSN Entered By: [name] on 02/22/2021 12:09:49 -------------------------------------------------------------------------------- Pain Assessment Details Patient Name: Date of Service: Chad [name] [name] 02/22/2021 11:15 A M Medical Record Number: [id number] Patient Account Number: [id number] Date of Birth/Sex: Treating RN: [birth year] (55 y.o. Chad [name] Primary Care Alaila Pillard: PCP, NO Other Clinician: Referring Vale Mousseau: Treating Tamari Redwine/Extender: [name] in Treatment: 3 Active Problems Location of Pain Severity and Description of Pain Patient Has Paino No Site Locations Pain Management and Medication Current Pain Management: Electronic Signature(s) Signed: 02/22/2021 5:19:11 PM By: Karl Ito Signed: 02/22/2021 6:12:56 PM By: [name] RN, BSN Entered By: Karl Ito on 02/22/2021 12:02:42 -------------------------------------------------------------------------------- Patient/Caregiver Education Details Patient Name: Date of Service: Chad [name] [name] 6/8/2022andnbsp11:15 A M Medical Record Number: [id number] Patient Account Number: [id number] Date of Birth/Gender: Treating RN: 09-04-1966 (55 y.o. Chad [name] Primary Care Physician: PCP, NO Other Clinician: Referring Physician: Treating Physician/Extender: [name] in Treatment: 3 Education Assessment Education  Provided To: Patient Education Topics Provided Venous: Methods: Explain/Verbal Responses: Reinforcements needed, State content correctly Wound/Skin  Impairment: Methods: Explain/Verbal Responses: Reinforcements needed, State content correctly Electronic Signature(s) Signed: 02/22/2021 6:12:56 PM By: [name] RN, BSN Entered By: [name] on 02/22/2021 12:10:13 -------------------------------------------------------------------------------- Wound Assessment Details Patient Name: Date of Service: Chad [name] [facility] 02/22/2021 11:15 A M Medical Record Number: [id number] Patient Account Number: [id number] Date of Birth/Sex: Treating RN: June 16, 1966 (55 y.o. Chad [name] Primary Care Remiel Corti: PCP, NO Other Clinician: Referring Minnah Llamas: Treating Isa Hitz/Extender: [name] in Treatment: 3 Wound Status Wound Number: 2 Primary Etiology: Venous Leg Ulcer Wound Location: Left, Lateral Lower Leg Wound Status: Open Wounding Event: Blister Date Acquired: 12/16/2020 Weeks Of Treatment: 3 Clustered Wound: No Wound Measurements Length: (cm) Width: (cm) Depth: (cm) Area: (cm) Volume: (cm) 0 % Reduction in Area: 100% 0 % Reduction in Volume: 100% 0 0 0 Wound Description Classification: Full Thickness Without Exposed Support Structur es Electronic Signature(s) Signed: 02/22/2021 5:19:11 PM By: Karl Ito Signed: 02/22/2021 6:12:56 PM By: [name] RN, BSN Entered By: Karl Ito on 02/22/2021 12:07:47 -------------------------------------------------------------------------------- Wound Assessment Details Patient Name: Date of Service: Chad [name] [facility] 02/22/2021 11:15 A M Medical Record Number: [id number] Patient Account Number: [id number] Date of Birth/Sex: Treating RN: [birth year] (55 y.o. Chad [name] Primary Care Pinchas Reither: PCP, NO Other Clinician: Referring Amethyst Gainer: Treating Odarius Dines/Extender: [name] in Treatment: 3 Wound Status Wound Number: 3 Primary Etiology: Venous Leg Ulcer Wound Location: Right, Lateral Lower Leg Wound  Status: Open Wounding Event: Blister Comorbid History: Hypertension, Peripheral Venous Disease Date Acquired: 12/16/2020 Weeks Of Treatment: 3 Clustered Wound: No Photos Wound Measurements Length: (cm) 1 Width: (cm) 0.9 Depth: (cm) 0.1 Area: (cm) 0.707 Volume: (cm) 0.071 % Reduction in Area: 77.3% % Reduction in Volume: 77.2% Epithelialization: Large (67-100%) Tunneling: No Undermining: No Wound Description Classification: Full Thickness Without Exposed Support Structures Wound Margin: Distinct, outline attached Exudate Amount: Medium Exudate Type: Serosanguineous Exudate Color: red, brown Foul Odor After Cleansing: No Slough/Fibrino No Wound Bed Granulation Amount: Large (67-100%) Exposed Structure Granulation Quality: Red Fascia Exposed: No Necrotic Amount: None Present (0%) Fat Layer (Subcutaneous Tissue) Exposed: Yes Tendon Exposed: No Muscle Exposed: No Joint Exposed: No Bone Exposed: No Electronic Signature(s) Signed: 02/22/2021 5:19:11 PM By: Karl Ito Signed: 02/22/2021 6:12:56 PM By: [name] RN, BSN Entered By: Karl Ito on 02/22/2021 16:32:58 -------------------------------------------------------------------------------- Vitals Details Patient Name: Date of Service: Chad [name] [facility] 02/22/2021 11:15 A M Medical Record Number: [id number] Patient Account Number: [id number] Date of Birth/Sex: Treating RN: [birth year] (55 y.o. Chad [name] Primary Care Velna Hedgecock: PCP, NO Other Clinician: Referring Khan Chura: Treating Chalonda Schlatter/Extender: [name] in Treatment: 3 Vital Signs Time Taken: 12:02 Temperature (F): 98.1 Height (in): 76 Pulse (bpm): 105 Weight (lbs): 500 Respiratory Rate (breaths/min): 20 Body Mass Index (BMI): 60.9 Blood Pressure (mmHg): 200/103 Reference Range: 80 - 120 mg / dl Electronic Signature(s) Signed: 02/22/2021 5:19:11 PM By: Karl Ito Entered By: Karl Ito on  02/22/2021 12:02:30
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
## 2021-03-01 ENCOUNTER — Other Ambulatory Visit: Payer: Self-pay

## 2021-03-01 ENCOUNTER — Encounter (HOSPITAL_BASED_OUTPATIENT_CLINIC_OR_DEPARTMENT_OTHER): Payer: Federal, State, Local not specified - PPO | Admitting: Physician Assistant

## 2021-03-01 DIAGNOSIS — L97829 Non-pressure chronic ulcer of other part of left lower leg with unspecified severity: Secondary | ICD-10-CM | POA: Diagnosis not present
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
## 2021-03-01 NOTE — Progress Notes (Signed)
Chad [name], Chad [name] ([id number]) Visit Report for 03/01/2021 Arrival Information Details Patient Name: Date of Service: Chad [name] [facility] 03/01/2021 11:30 A M Medical Record Number: [id number] Patient Account Number: [id number] Date of Birth/Sex: Treating RN: 22-Oct-1965 (55 y.o. Chad [name], Chad [name] Primary Care Chad [name]: PCP, NO Other Clinician: Referring Chad [name]: Treating Chad [name]/Extender: Chad [name] in Treatment: 4 Visit Information History Since Last Visit Added or deleted any medications: No Patient Arrived: Ambulatory Any new allergies or adverse reactions: No Arrival Time: 11:52 Had a fall or experienced change in No Transfer Assistance: None activities of daily living that may affect Patient Identification Verified: Yes risk of falls: Secondary Verification Process Completed: Yes Signs or symptoms of abuse/neglect since last visito No Patient Requires Transmission-Based Precautions: No Hospitalized since last visit: No Patient Has Alerts: Yes Implantable device outside of the clinic excluding No Patient Alerts: R ABI: non compressible cellular tissue based products placed in the center L ABI: non compressible since last visit: Has Dressing in Place as Prescribed: Yes Has Compression in Place as Prescribed: Yes Pain Present Now: No Electronic Signature(s) Signed: 03/01/2021 6:22:58 PM By: Chad Mu RN Entered By: Chad [name] on 03/01/2021 11:55:31 -------------------------------------------------------------------------------- Clinic Level of Care Assessment Details Patient Name: Date of Service: Chad [name] [facility] 03/01/2021 11:30 A M Medical Record Number: [id number] Patient Account Number: [id number] Date of Birth/Sex: Treating RN: [birth year] (55 y.o. Chad [name] Primary Care Chad [name]: PCP, NO Other Clinician: Referring Chad [name]: Treating Chad [name]/Extender: Chad [name] in Treatment: 4 Clinic Level  of Care Assessment Items TOOL 4 Quantity Score []  - 0 Use when only an EandM is performed on FOLLOW-UP visit ASSESSMENTS - Nursing Assessment / Reassessment X- 1 10 Reassessment of Co-morbidities (includes updates in patient status) X- 1 5 Reassessment of Adherence to Treatment Plan ASSESSMENTS - Wound and Skin A ssessment / Reassessment X - Simple Wound Assessment / Reassessment - one wound 1 5 []  - 0 Complex Wound Assessment / Reassessment - multiple wounds []  - 0 Dermatologic / Skin Assessment (not related to wound area) ASSESSMENTS - Focused Assessment []  - 0 Circumferential Edema Measurements - multi extremities []  - 0 Nutritional Assessment / Counseling / Intervention []  - 0 Lower Extremity Assessment (monofilament, tuning fork, pulses) []  - 0 Peripheral Arterial Disease Assessment (using hand held doppler) ASSESSMENTS - Ostomy and/or Continence Assessment and Care []  - 0 Incontinence Assessment and Management []  - 0 Ostomy Care Assessment and Management (repouching, etc.) PROCESS - Coordination of Care X - Simple Patient / Family Education for ongoing care 1 15 []  - 0 Complex (extensive) Patient / Family Education for ongoing care X- 1 10 Staff obtains , Records, T Results / Process Orders est []  - 0 Staff telephones HHA, Nursing Homes / Clarify orders / etc []  - 0 Routine Transfer to another Facility (non-emergent condition) []  - 0 Routine [name] Admission (non-emergent condition) []  - 0 New Admissions / / Ordering NPWT Apligraf, etc. , []  - 0 Emergency [name] Admission (emergent condition) X- 1 10 Simple Discharge Coordination []  - 0 Complex (extensive) Discharge Coordination PROCESS - Special Needs []  - 0 Pediatric / Minor Patient Management []  - 0 Isolation Patient Management []  - 0 Hearing / Language / Visual special needs []  - 0 Assessment of Community assistance (transportation, D/C planning, etc.) []  -  0 Additional assistance / Altered mentation []  - 0 Support Surface(s) Assessment (bed, cushion, seat, etc.) INTERVENTIONS - Wound Cleansing / Measurement X -  Simple Wound Cleansing - one wound 1 5 []  - 0 Complex Wound Cleansing - multiple wounds X- 1 5 Wound Imaging (photographs - any number of wounds) []  - 0 Wound Tracing (instead of photographs) []  - 0 Simple Wound Measurement - one wound []  - 0 Complex Wound Measurement - multiple wounds INTERVENTIONS - Wound Dressings []  - 0 Small Wound Dressing one or multiple wounds []  - 0 Medium Wound Dressing one or multiple wounds []  - 0 Large Wound Dressing one or multiple wounds []  - 0 Application of Medications - topical []  - 0 Application of Medications - injection INTERVENTIONS - Miscellaneous []  - 0 External ear exam []  - 0 Specimen Collection (cultures, biopsies, blood, body fluids, etc.) []  - 0 Specimen(s) / Culture(s) sent or taken to Lab for analysis []  - 0 Patient Transfer (multiple staff / / Similar devices) []  - 0 Simple Staple / Suture removal (25 or less) []  - 0 Complex Staple / Suture removal (26 or more) []  - 0 Hypo / Hyperglycemic Management (close monitor of Blood Glucose) []  - 0 Ankle / Brachial Index (ABI) - do not check if billed separately X- 1 5 Vital Signs Has the patient been seen at the [name] within the last three years: Yes Total Score: 70 Level Of Care: New/Established - Level 2 Electronic Signature(s) Signed: 03/01/2021 6:34:46 PM By: RN, BSN Entered By: on 03/01/2021 12:08:08 -------------------------------------------------------------------------------- Encounter Discharge Information Details Patient Name: Date of Service: [facility] 03/01/2021 11:30 A M Medical Record Number: Patient Account Number: Date of Birth/Sex: Treating RN: September 17, 1966 (55 y.o. Primary Care Oleg Oleson: PCP, NO Other  Clinician: Referring Nianna Igo: Treating Kian Gamarra/Extender: Chad [name] in Treatment: 4 Encounter Discharge Information Items Discharge Condition: Stable Ambulatory Status: Ambulatory Discharge Destination: Home Transportation: Private Auto Schedule Follow-up Appointment: No Clinical Summary of Care: Provided on 03/01/2021 Form Type Recipient Paper Patient Patient Electronic Signature(s) Signed: 03/01/2021 12:15:40 PM By: Entered By: on 03/01/2021 12:15:40 -------------------------------------------------------------------------------- Lower Extremity Assessment Details Patient Name: Date of Service: Chad [name] [facility] 03/01/2021 11:30 A M Medical Record Number: Chad [name] Patient Account Number: [name] Date of Birth/Sex: Treating RN: [birth year] (54 y.o. [id number] Primary Care Danijela Vessey: PCP, NO Other Clinician: Referring Cella Cappello: Treating Olanna Percifield/Extender: 01/21/1966 in Treatment: 4 Edema Assessment Assessed: [Left: No] [Right: Yes] Edema: [Left: Yes] [Right: Yes] Calf Left: Right: Point of Measurement: 39 cm From Medial Instep 55 cm 55 cm Ankle Left: Right: Point of Measurement: 11 cm From Medial Instep 28 cm 28 cm Electronic Signature(s) Signed: 03/01/2021 12:14:47 PM By: Lytle Michaels Entered By: Chad [name] on 03/01/2021 12:14:46 -------------------------------------------------------------------------------- Multi-Disciplinary Care Plan Details Patient Name: Date of Service: 03/03/2021 [name] 03/01/2021 11:30 A M Medical Record Number: 03/03/2021 Patient Account Number: Chad [name] Date of Birth/Sex: Treating RN: 1966/08/03 (55 y.o. [id number] Primary Care Tyrease Vandeberg: PCP, NO Other Clinician: Referring Kyiesha Millward: Treating Keyon Winnick/Extender: [id number] in Treatment: 4 Multidisciplinary Care Plan reviewed with physician Active  Inactive Electronic Signature(s) Signed: 03/01/2021 6:34:46 PM By: 53 RN, BSN Entered By: Lytle Michaels on 03/01/2021 12:06:54 -------------------------------------------------------------------------------- Pain Assessment Details Patient Name: Date of Service: 03/03/2021 [name] 03/01/2021 11:30 A M Medical Record Number: 03/03/2021 Patient Account Number: Chad [name] Date of Birth/Sex: Treating RN: Feb 14, 1966 (55 y.o. [id number] Primary Care Alee Katen: PCP, NO Other Clinician: Referring  Katherine Syme: Treating Hager Compston/Extender: Chad [name] in Treatment: 4 Active Problems Location of Pain Severity and Description of Pain Patient Has Paino No Site Locations Rate the pain. Current Pain Level: 0 Pain Management and Medication Current Pain Management: Electronic Signature(s) Signed: 03/01/2021 6:34:46 PM By: Chad Deed RN, BSN Entered By: Chad [name] on 03/01/2021 12:04:12 -------------------------------------------------------------------------------- Patient/Caregiver Education Details Patient Name: Date of Service: Chad [name] [name] 6/15/2022andnbsp11:30 A M Medical Record Number: [id number] Patient Account Number: [id number] Date of Birth/Gender: Treating RN: July 15, 1966 (55 y.o. Chad [name] Primary Care Physician: PCP, NO Other Clinician: Referring Physician: Treating Physician/Extender: Chad [name] in Treatment: 4 Education Assessment Education Provided To: Patient Education Topics Provided Venous: Methods: Explain/Verbal Responses: Reinforcements needed, State content correctly Wound/Skin Impairment: Methods: Explain/Verbal Responses: Reinforcements needed, State content correctly Electronic Signature(s) Signed: 03/01/2021 6:34:46 PM By: Chad Deed RN, BSN Entered By: Chad [name] on 03/01/2021  12:07:17 -------------------------------------------------------------------------------- Wound Assessment Details Patient Name: Date of Service: Chad [name] [name] 03/01/2021 11:30 A M Medical Record Number: [id number] Patient Account Number: [id number] Date of Birth/Sex: Treating RN: Jan 21, 1966 (55 y.o. Chad [name], Chad [name] Primary Care Anelle Parlow: PCP, NO Other Clinician: Referring Fahd Galea: Treating Nicosha Struve/Extender: Chad [name] in Treatment: 4 Wound Status Wound Number: 3 Primary Etiology: Venous Leg Ulcer Wound Location: Right, Lateral Lower Leg Wound Status: Healed - Epithelialized Wounding Event: Blister Comorbid History: Hypertension, Peripheral Venous Disease Date Acquired: 12/16/2020 Weeks Of Treatment: 4 Clustered Wound: No Wound Measurements Length: (cm) Width: (cm) Depth: (cm) Area: (cm) Volume: (cm) 0 % Reduction in Area: 100% 0 % Reduction in Volume: 100% 0 Epithelialization: Large (67-100%) 0 Tunneling: No 0 Undermining: No Wound Description Classification: Full Thickness Without Exposed Support Structure Exudate Amount: None Present s Wound Bed Granulation Amount: None Present (0%) Exposed Structure Necrotic Amount: None Present (0%) Fascia Exposed: No Fat Layer (Subcutaneous Tissue) Exposed: No Tendon Exposed: No Muscle Exposed: No Joint Exposed: No Bone Exposed: No Treatment Notes Wound #3 (Lower Leg) Wound Laterality: Right, Lateral Cleanser Peri-Wound Care Topical Primary Dressing Secondary Dressing Secured With Compression Wrap Compression Stockings Add-Ons Notes Juxtalites applied to bilateral legs Electronic Signature(s) Signed: 03/01/2021 6:22:58 PM By: Chad Mu RN Entered By: Chad [name] on 03/01/2021 12:02:16 -------------------------------------------------------------------------------- Vitals Details Patient Name: Date of Service: Chad [name] [name] 03/01/2021 11:30 A M Medical Record  Number: [id number] Patient Account Number: [id number] Date of Birth/Sex: Treating RN: 24-Nov-1965 (55 y.o. Chad [name], Chad [name] Primary Care Niyam Bisping: PCP, NO Other Clinician: Referring Taheera Thomann: Treating Alexx Mcburney/Extender: Chad [name] in Treatment: 4 Vital Signs Time Taken: 11:55 Temperature (F): 97.9 Height (in): 76 Pulse (bpm): 98 Weight (lbs): 500 Respiratory Rate (breaths/min): 20 Body Mass Index (BMI): 60.9 Blood Pressure (mmHg): 182/100 Reference Range: 80 - 120 mg / dl Electronic Signature(s) Signed: 03/01/2021 6:22:58 PM By: Chad Mu RN Entered By: Chad [name] on 03/01/2021 11:55:50

## 2021-03-08 ENCOUNTER — Encounter (HOSPITAL_BASED_OUTPATIENT_CLINIC_OR_DEPARTMENT_OTHER): Payer: Federal, State, Local not specified - PPO | Admitting: Internal Medicine

## 2021-05-26 ENCOUNTER — Encounter (HOSPITAL_BASED_OUTPATIENT_CLINIC_OR_DEPARTMENT_OTHER): Payer: Self-pay | Admitting: Emergency Medicine

## 2021-05-26 ENCOUNTER — Other Ambulatory Visit: Payer: Self-pay

## 2021-05-26 ENCOUNTER — Emergency Department (HOSPITAL_BASED_OUTPATIENT_CLINIC_OR_DEPARTMENT_OTHER)
Admission: EM | Admit: 2021-05-26 | Discharge: 2021-05-26 | Disposition: A | Discharge status: Home / Self Care | Payer: Federal, State, Local not specified - PPO | Attending: Emergency Medicine | Admitting: Emergency Medicine

## 2021-05-26 ENCOUNTER — Emergency Department (HOSPITAL_BASED_OUTPATIENT_CLINIC_OR_DEPARTMENT_OTHER): Payer: Federal, State, Local not specified - PPO | Admitting: Radiology

## 2021-05-26 DIAGNOSIS — M1612 Unilateral primary osteoarthritis, left hip: Secondary | ICD-10-CM

## 2021-05-26 DIAGNOSIS — M549 Dorsalgia, unspecified: Secondary | ICD-10-CM | POA: Diagnosis not present

## 2021-05-26 DIAGNOSIS — Z79899 Other long term (current) drug therapy: Secondary | ICD-10-CM | POA: Diagnosis not present

## 2021-05-26 DIAGNOSIS — I10 Essential (primary) hypertension: Secondary | ICD-10-CM | POA: Diagnosis not present

## 2021-05-26 DIAGNOSIS — R600 Localized edema: Secondary | ICD-10-CM | POA: Insufficient documentation

## 2021-05-26 DIAGNOSIS — F17210 Nicotine dependence, cigarettes, uncomplicated: Secondary | ICD-10-CM | POA: Diagnosis not present

## 2021-05-26 DIAGNOSIS — G8929 Other chronic pain: Secondary | ICD-10-CM | POA: Diagnosis not present

## 2021-05-26 DIAGNOSIS — M25552 Pain in left hip: Secondary | ICD-10-CM | POA: Diagnosis present

## 2021-05-26 NOTE — ED Notes (Signed)
Dc instructions reviewed with patient and patient voiced understanding.

## 2021-05-26 NOTE — Discharge Instructions (Addendum)
Tylenol is the safest arthritis medication. You can take 1 g three times daily.

## 2021-05-26 NOTE — ED Provider Notes (Signed)
[facility] EMERGENCY DEPT Provider Note   CSN: [id number] Arrival date & time: 05/26/21  1000     History Chief Complaint  Patient presents with   Leg Pain    Chad [name] is a 55 y.o. male.  He also reports that he walks without assistance.  He states that he has not taken his blood pressure medications today.  The history is provided by the patient.  Leg Pain Location:  Hip Hip location:  L hip Pain details:    Quality:  Aching   Radiates to:  Does not radiate   Severity:  Mild   Onset quality:  Sudden   Duration:  5 days   Timing:  Intermittent   Progression:  Unchanged Chronicity:  New Dislocation: no   Prior injury to area:  No Relieved by:  Nothing Worsened by:  Bearing weight (Worse when he initially gets up from a chair. Improved after he is moving for awhile) Ineffective treatments:  None tried Associated symptoms: back pain (chronic, not worse than normal) and swelling (lower legs swollen chronically)   Associated symptoms: no fever, no numbness and no tingling       Past Medical History:  Diagnosis Date   Hypertension     Patient Active Problem List   Diagnosis Date Noted   Leg swelling 01/20/2021    History reviewed. No pertinent surgical history.     Family History  Problem Relation Age of Onset   Thyroid cancer Mother    Dementia Mother    Prostate cancer Father    Dementia Father     Social History   Tobacco Use   Smoking status: Some Days    Types: Cigarettes   Smokeless tobacco: Never  Substance Use Topics   Alcohol use: Yes    Comment: occ   Drug use: Yes    Types: Marijuana    Home Medications Prior to Admission medications   Medication Sig Start Date End Date Taking? Authorizing Provider  amLODipine-benazepril (LOTREL) 10-20 MG capsule Take 1 capsule by mouth daily.   Yes [provider]  cloNIDine (CATAPRES) 0.3 MG tablet Take by mouth.   Yes [provider]  hydrochlorothiazide  (HYDRODIURIL) 25 MG tablet Take by mouth.   Yes [provider]  amoxicillin (AMOXIL) 500 MG tablet Take 500 mg by mouth 3 (three) times daily. 01/16/21   [provider]  benzonatate (TESSALON) 100 MG capsule Take 1 capsule (100 mg total) by mouth every 8 (eight) hours. Patient not taking: Reported on 01/20/2021 11/03/18   Hedges, Tinnie Gens, PA-C  fluticasone [facility]) 50 MCG/ACT nasal spray Place 1 spray into both nostrils daily. Patient not taking: Reported on 01/20/2021 11/03/18   Hedges, Tinnie Gens, PA-C  guaiFENesin (MUCINEX) 600 MG 12 hr tablet Take 1 tablet (600 mg total) by mouth 2 (two) times daily. Patient not taking: Reported on 01/20/2021 11/03/18   Eyvonne Mechanic, PA-C    Allergies    Patient has no known allergies.  Review of Systems   Review of Systems  Constitutional:  Negative for chills and fever.  HENT:  Negative for ear pain and sore throat.   Eyes:  Negative for pain and visual disturbance.  Respiratory:  Negative for cough and shortness of breath.   Cardiovascular:  Negative for chest pain and palpitations.  Gastrointestinal:  Negative for abdominal pain and vomiting.  Genitourinary:  Negative for dysuria and hematuria.  Musculoskeletal:  Positive for back pain (chronic, not worse than normal). Negative for arthralgias.  Skin:  Negative for color change and rash.  Neurological:  Negative for seizures and syncope.  All other systems reviewed and are negative.  Physical Exam Updated Vital Signs BP (!) 191/119 (BP Location: Right Arm)   Temp 98.6 F (37 C) (Oral)   Resp 20   SpO2 95%   Physical Exam Vitals and nursing note reviewed.  Constitutional:      Appearance: Normal appearance.  HENT:     Head: Normocephalic and atraumatic.  Eyes:     Conjunctiva/sclera: Conjunctivae normal.  Pulmonary:     Effort: Pulmonary effort is normal. No respiratory distress.  Musculoskeletal:        General: No deformity.     Cervical back: Normal range of motion.      Comments: Bilateral pitting edema to the knees.  No palpable venous cords in the affected limb.  Hip range of motion is slightly decreased and painful especially for internal rotation on the left leg.  Exam is limited secondary to the size of his limbs and his mobility. The limb is warm and well-perfused.  Skin:    General: Skin is warm and dry.  Neurological:     General: No focal deficit present.     Mental Status: He is alert and oriented to person, place, and time. Mental status is at baseline.  Psychiatric:        Mood and Affect: Mood normal.    ED Results / Procedures / Treatments   Labs (all labs ordered are listed, but only abnormal results are displayed) Labs Reviewed - No data to display  EKG None  Radiology DG Hip Unilat With Pelvis 2-3 Views Left  Result Date: 05/26/2021 CLINICAL DATA:  LEFT medial hip pain for 5 days, no known injury EXAM: DG HIP (WITH OR WITHOUT PELVIS) 2-3V LEFT COMPARISON:  None FINDINGS: Osseous mineralization normal. Mild narrowing of hip joints bilaterally. SI joints preserved. No acute fracture, dislocation, or bone destruction. 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 No acute osseous abnormalities. Electronically Signed   By: Chad [name] M.D.   On: 05/26/2021 12:36    Procedures Procedures   Medications Ordered in ED Medications - No data to display  ED Course  I have reviewed the triage vital signs and the nursing notes.  Pertinent labs & imaging results that were available during my care of the patient were reviewed by me and considered in my medical decision making (see chart for details).    MDM Rules/Calculators/A&P                           [name] presents with left hip pain.  Pain seems to be musculoskeletal in etiology.  I did consider whether or not this represented a skin infection, DVT, referred pain from his back, referred pain from his abdomen.  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  I counseled him to take Tylenol for pain.  Orthopedic follow-up was given.  Finally, he did have quite elevated blood pressure today in the ED, he was told to take his antihypertensive meds as prescribed.  He did not take them this morning which likely explains his high blood pressure.  He is not endorsing any symptoms consistent with hypertensive emergency. Final Clinical Impression(s) / ED Diagnoses Final diagnoses:  Arthritis of left hip  Primary hypertension    Rx /  DC Orders ED Discharge Orders     None        Koleen Distance, MD 05/26/21 1247

## 2021-05-26 NOTE — ED Notes (Signed)
Pt declined to have vital signs taken, ready to go.

## 2021-05-26 NOTE — ED Triage Notes (Signed)
Left thigh/groin pain for 5 days, wonders if he pulled a muscle. Pain is relieved with aleve/percocet but comes back.

## 2021-06-30 ENCOUNTER — Other Ambulatory Visit: Payer: Self-pay

## 2021-06-30 ENCOUNTER — Ambulatory Visit
Admission: EM | Admit: 2021-06-30 | Discharge: 2021-06-30 | Disposition: A | Discharge status: Home / Self Care | Payer: Medicare Other

## 2021-06-30 DIAGNOSIS — J209 Acute bronchitis, unspecified: Secondary | ICD-10-CM

## 2021-06-30 HISTORY — DX: Unspecified osteoarthritis, unspecified site: M19.90

## 2021-06-30 MED ORDER — PREDNISONE 20 MG PO TABS: 40.0000 mg | ORAL_TABLET | Freq: Every day | ORAL | 0 refills | Status: AC

## 2021-06-30 MED ORDER — DOXYCYCLINE HYCLATE 100 MG PO CAPS: 100.0000 mg | ORAL_CAPSULE | Freq: Two times a day (BID) | ORAL | 0 refills | Status: DC

## 2021-06-30 MED ORDER — PROMETHAZINE-DM 6.25-15 MG/5ML PO SYRP: 5.0000 mL | ORAL_SOLUTION | Freq: Four times a day (QID) | ORAL | 0 refills | Status: AC | PRN

## 2021-06-30 MED ORDER — BENZONATATE 100 MG PO CAPS: 100.0000 mg | ORAL_CAPSULE | Freq: Three times a day (TID) | ORAL | 0 refills | Status: AC

## 2021-06-30 NOTE — ED Triage Notes (Signed)
5 day h/o cough and congestion with intermittent wheezing. Has taken some theraflu, noting he was cautious due to BP. Cough is interfering with his sleep. Pt is fully covid vaccinated. No GI sxs.  Pt was prescribed amoxicillin for a toothache, but reports that he did not take the meds b/c his tooth stopped hurting.

## 2021-06-30 NOTE — ED Provider Notes (Signed)
[facility]    CSN: [id number] Arrival date & time: 06/30/21  1545      History   Chief Complaint Chief Complaint  Patient presents with  . Nasal Congestion  . Cough    HPI Chad [name] is a 55 y.o. male.   Patient here today for evaluation of nasal congestion and drainage and cough that started 5 days ago. He reports that cough is worse at night. He denies any fever or chills. He has not had ear pain or sore throat. He has tried multiple OTC meds without relief. He has had bronchitis in the past and feels symptoms are similar.   The history is provided by the patient.  Cough Associated symptoms: no chills, no ear pain, no eye discharge, no fever and no sore throat    Past Medical History:  Diagnosis Date  . Arthritis   . Hypertension     Patient Active Problem List   Diagnosis Date Noted  . Leg swelling 01/20/2021    History reviewed. No pertinent surgical history.     Home Medications    Prior to Admission medications   Medication Sig Start Date End Date Taking? Authorizing Provider  benzonatate (TESSALON) 100 MG capsule Take 1 capsule (100 mg total) by mouth every 8 (eight) hours. 06/30/21  Yes Tomi Bamberger, PA-C  doxycycline (VIBRAMYCIN) 100 MG capsule Take 1 capsule (100 mg total) by mouth 2 (two) times daily. 06/30/21  Yes Tomi Bamberger, PA-C  predniSONE (DELTASONE) 20 MG tablet Take 2 tablets (40 mg total) by mouth daily with breakfast for 5 days. 06/30/21 07/05/21 Yes Tomi Bamberger, PA-C  promethazine-dextromethorphan (PROMETHAZINE-DM) 6.25-15 MG/5ML syrup Take 5 mLs by mouth 4 (four) times daily as needed for up to 7 days for cough. 06/30/21 07/07/21 Yes Tomi Bamberger, PA-C  amLODipine-benazepril (LOTREL) 10-20 MG capsule Take 1 capsule by mouth daily.    [provider]  cloNIDine (CATAPRES) 0.3 MG tablet Take by mouth.    [provider]  fluticasone (FLONASE) 50 MCG/ACT nasal spray Place 1 spray into both  nostrils daily. Patient not taking: Reported on 01/20/2021 11/03/18   Hedges, Tinnie Gens, PA-C  guaiFENesin (MUCINEX) 600 MG 12 hr tablet Take 1 tablet (600 mg total) by mouth 2 (two) times daily. Patient not taking: Reported on 01/20/2021 11/03/18   Hedges, Tinnie Gens, PA-C  hydrochlorothiazide (HYDRODIURIL) 25 MG tablet Take by mouth.    [provider]  omeprazole (PRILOSEC) 40 MG capsule Take 40 mg by mouth daily. 05/17/21   [provider]    Family History Family History  Problem Relation Age of Onset  . Thyroid cancer Mother   . Dementia Mother   . Prostate cancer Father   . Dementia Father     Social History Social History   Tobacco Use  . Smoking status: Some Days    Types: Cigarettes  . Smokeless tobacco: Never  Substance Use Topics  . Alcohol use: Yes    Comment: occ  . Drug use: Yes    Types: Marijuana    Comment: occasionally     Allergies   Patient has no known allergies.   Review of Systems Review of Systems  Constitutional:  Negative for chills and fever.  HENT:  Positive for congestion. Negative for ear pain and sore throat.   Eyes:  Negative for discharge and redness.  Respiratory:  Positive for cough.   Gastrointestinal:  Negative for diarrhea, nausea and vomiting.    Physical Exam  Triage Vital Signs ED Triage Vitals  Enc Vitals Group     BP 06/30/21 1618 (!) 170/98     Pulse Rate 06/30/21 1618 86     Resp 06/30/21 1618 16     Temp 06/30/21 1618 98 F (36.7 C)     Temp Source 06/30/21 1618 Oral     SpO2 06/30/21 1618 95 %     Weight --      Height --      Head Circumference --      Peak Flow --      Pain Score 06/30/21 1607 6     Pain Loc --      Pain Edu? --      Excl. in GC? --    No data found.  Updated Vital Signs BP (!) 170/98 (BP Location: Left Arm)   Pulse 86   Temp 98 F (36.7 C) (Oral)   Resp 16   SpO2 95%     Physical Exam Vitals and nursing note reviewed.  Constitutional:      General: He is not in  acute distress.    Appearance: Normal appearance. He is not ill-appearing.  HENT:     Head: Normocephalic and atraumatic.     Nose: Congestion present.  Eyes:     Conjunctiva/sclera: Conjunctivae normal.  Cardiovascular:     Rate and Rhythm: Normal rate and regular rhythm.     Heart sounds: Normal heart sounds. No murmur heard. Pulmonary:     Effort: Pulmonary effort is normal. No respiratory distress.     Breath sounds: Normal breath sounds. No wheezing, rhonchi or rales.  Skin:    General: Skin is warm and dry.  Neurological:     Mental Status: He is alert.  Psychiatric:        Mood and Affect: Mood normal.        Behavior: Behavior normal.     UC Treatments / Results  Labs (all labs ordered are listed, but only abnormal results are displayed) Labs Reviewed - No data to display  EKG   Radiology No results found.  Procedures Procedures (including critical care time)  Medications Ordered in UC Medications - No data to display  Initial Impression / Assessment and Plan / UC Course  I have reviewed the triage vital signs and the nursing notes.  Pertinent labs & imaging results that were available during my care of the patient were reviewed by me and considered in my medical decision making (see chart for details).   Suspect likely bronchitis- will treat with prednisone burst as well as antibiotic to cover impending pneumonia. Recommend follow up with any further concerns.   Final Clinical Impressions(s) / UC Diagnoses   Final diagnoses:  Acute bronchitis, unspecified organism   Discharge Instructions   None    ED Prescriptions     Medication Sig Dispense Auth. Provider   predniSONE (DELTASONE) 20 MG tablet Take 2 tablets (40 mg total) by mouth daily with breakfast for 5 days. 10 tablet Erma Pinto F, PA-C   doxycycline (VIBRAMYCIN) 100 MG capsule Take 1 capsule (100 mg total) by mouth 2 (two) times daily. 20 capsule Erma Pinto F, PA-C   benzonatate  (TESSALON) 100 MG capsule Take 1 capsule (100 mg total) by mouth every 8 (eight) hours. 21 capsule Erma Pinto F, PA-C   promethazine-dextromethorphan (PROMETHAZINE-DM) 6.25-15 MG/5ML syrup Take 5 mLs by mouth 4 (four) times daily as needed for up to 7 days for cough. 118 mL  Tomi Bamberger, PA-C      PDMP not reviewed this encounter.   Tomi Bamberger, PA-C 06/30/21 1721

## 2021-08-02 ENCOUNTER — Encounter (HOSPITAL_BASED_OUTPATIENT_CLINIC_OR_DEPARTMENT_OTHER): Payer: Federal, State, Local not specified - PPO | Attending: Physician Assistant | Admitting: Physician Assistant

## 2021-08-02 ENCOUNTER — Other Ambulatory Visit: Payer: Self-pay

## 2021-08-02 DIAGNOSIS — L97812 Non-pressure chronic ulcer of other part of right lower leg with fat layer exposed: Secondary | ICD-10-CM | POA: Insufficient documentation

## 2021-08-02 DIAGNOSIS — L97822 Non-pressure chronic ulcer of other part of left lower leg with fat layer exposed: Secondary | ICD-10-CM | POA: Diagnosis present

## 2021-08-02 DIAGNOSIS — I10 Essential (primary) hypertension: Secondary | ICD-10-CM | POA: Diagnosis not present

## 2021-08-02 DIAGNOSIS — I890 Lymphedema, not elsewhere classified: Secondary | ICD-10-CM | POA: Insufficient documentation

## 2021-08-02 DIAGNOSIS — I872 Venous insufficiency (chronic) (peripheral): Secondary | ICD-10-CM | POA: Diagnosis not present

## 2021-08-02 NOTE — Progress Notes (Signed)
Chad Chad [name] ([id number]) Visit Report for 08/02/2021 Allergy List Details Patient Name: Date of Service: Chad Chad [name] 08/02/2021 7:30 A M Medical Record Number: [id number] Patient Account Number: [id number] Date of Birth/Sex: Treating RN: December 12, 1965 (55 y.o. Chad Chad [name] Primary Care Chad Chad [name]: PCP, NO Other Clinician: Referring Chad Chad [name]: Treating Chad Chad [name]/Extender: Chad Chad [name] in Treatment: 0 Allergies Active Allergies No Known Drug Allergies Allergy Notes Electronic Signature(s) Signed: 08/02/2021 5:43:37 PM By: Chad Chad [name] Entered By: Chad Chad [name] on 08/02/2021 07:53:13 -------------------------------------------------------------------------------- Arrival Information Details Patient Name: Date of Service: Chad Chad [name] [facility] 08/02/2021 7:30 [place] Record Number: [id number] Patient Account Number: [id number] Date of Birth/Sex: Treating RN: 12-02-1965 (55 y.o. Chad Chad [name] Primary Care Chad Chad [name]: PCP, NO Other Clinician: Referring Chad Chad [name]: Treating Chad Chad [name]/Extender: Chad Chad [name] in Treatment: 0 Visit Information Patient Arrived: Ambulatory Arrival Time: 07:50 Accompanied By: self Transfer Assistance: None Patient Identification Verified: Yes Secondary Verification Process Completed: Yes Patient Requires Transmission-Based No Precautions: Patient Has Alerts: Yes Patient Alerts: non compressible BLE 5/22 History Since Last Visit Added or deleted any medications: No Any new allergies or adverse reactions: No Had a fall or experienced change in activities of daily living that may affect risk of falls: No Signs or symptoms of abuse/neglect since last visito No Hospitalized since last visit: No Implantable device outside of the clinic excluding cellular tissue based products placed in the Chad [name] since last visit: No Has Compression in Place as Prescribed: No Pain Present Now: Yes Notes Per patient cannot find his  juxtalites HD related to packed up in boxes from moving. Electronic Signature(s) Signed: 08/02/2021 5:43:37 PM By: Chad Chad [name] Entered By: Chad Chad [name] on 08/02/2021 08:07:22 -------------------------------------------------------------------------------- Clinic Level of Care Assessment Details Patient Name: Date of Service: Chad Chad [name] [facility] Chad [name] 08/02/2021 7:30 [place] Record Number: [id number] Patient Account Number: [id number] Date of Birth/Sex: Treating RN: 04-25-1966 (55 y.o. Chad Chad [name] Primary Care Chad Chad [name]: PCP, NO Other Clinician: Referring Chad Chad [name]: Treating Chad Chad [name]/Extender: Chad Chad [name] in Treatment: 0 Clinic Level of Care Assessment Items TOOL 1 Quantity Score X- 1 0 Use when EandM and Procedure is performed on INITIAL visit ASSESSMENTS - Nursing Assessment / Reassessment X- 1 20 General Physical Exam (combine w/ comprehensive assessment (listed just below) when performed on new pt. evals) X- 1 25 Comprehensive Assessment (HX, ROS, Risk Assessments, Wounds Hx, etc.) ASSESSMENTS - Wound and Skin Assessment / Reassessment X- 1 10 Dermatologic / Skin Assessment (not related to wound area) ASSESSMENTS - Ostomy and/or Continence Assessment and Care []  - 0 Incontinence Assessment and Management []  - 0 Ostomy Care Assessment and Management (repouching, etc.) PROCESS - Coordination of Care []  - 0 Simple Patient / Family Education for ongoing care X- 1 20 Complex (extensive) Patient / Family Education for ongoing care X- 1 10 Staff obtains Programmer, systems, Records, T Results / Process Orders est []  - 0 Staff telephones HHA, Nursing Homes / Clarify orders / etc []  - 0 Routine Transfer to another Facility (non-emergent condition) []  - 0 Routine Hospital Admission (non-emergent condition) X- 1 15 New Admissions / Biomedical engineer / Ordering NPWT Apligraf, etc. , []  - 0 Emergency Hospital Admission (emergent condition) PROCESS - Special  Needs []  - 0 Pediatric / Minor Patient Management []  - 0 Isolation Patient Management []  - 0 Hearing / Language / Visual special needs []  - 0 Assessment of Community assistance (transportation, D/C planning, etc.) []  - 0 Additional assistance / Altered mentation []  -  0 Support Surface(s) Assessment (bed, cushion, seat, etc.) INTERVENTIONS - Miscellaneous []  - 0 External ear exam []  - 0 Patient Transfer (multiple staff / Civil Service fast streamer / Similar devices) []  - 0 Simple Staple / Suture removal (25 or less) []  - 0 Complex Staple / Suture removal (26 or more) []  - 0 Hypo/Hyperglycemic Management (do not check if billed separately) []  - 0 Ankle / Brachial Index (ABI) - do not check if billed separately Has the patient been seen at the hospital within the last three years: Yes Total Score: 100 Level Of Care: New/Established - Level 3 Electronic Signature(s) Signed: 08/02/2021 5:43:37 PM By: Chad Chad [name] Signed: 08/02/2021 5:43:37 PM By: Chad Chad [name] Entered By: Chad Chad [name] on 08/02/2021 08:25:52 -------------------------------------------------------------------------------- Compression Therapy Details Patient Name: Date of Service: Chad Chad [name] [facility] 08/02/2021 7:30 [place] Record Number: [id number] Patient Account Number: [id number] Date of Birth/Sex: Treating RN: 04/17/1966 (55 y.o. Chad Chad [name] Primary Care Ruth Tully: PCP, NO Other Clinician: Referring Ngina Royer: Treating Joleah Kosak/Extender: Chad Chad [name] in Treatment: 0 Compression Therapy Performed for Wound Assessment: Wound #5 Left,Anterior Lower Leg Performed By: Clinician Chad Pilling, RN Compression Type: Three Layer Post Procedure Diagnosis Same as Pre-procedure Electronic Signature(s) Signed: 08/02/2021 5:43:37 PM By: Chad Chad [name] Entered By: Chad Chad [name] on 08/02/2021  08:31:48 -------------------------------------------------------------------------------- Compression Therapy Details Patient Name: Date of Service: Chad Chad [name] [name] 08/02/2021 7:30 [place] Record Number: [id number] Patient Account Number: [id number] Date of Birth/Sex: Treating RN: [birth year] (55 y.o. Chad Chad [name] Primary Care Patryck Kilgore: PCP, NO Other Clinician: Referring Edom Schmuhl: Treating Mariana Goytia/Extender: Chad Chad [name] in Treatment: 0 Compression Therapy Performed for Wound Assessment: Wound #7 Right,Anterior Lower Leg Performed By: Clinician Chad Pilling, RN Compression Type: Three Layer Post Procedure Diagnosis Same as Pre-procedure Electronic Signature(s) Signed: 08/02/2021 5:43:37 PM By: Chad Chad [name] Entered By: Chad Chad [name] on 08/02/2021 08:31:48 -------------------------------------------------------------------------------- Compression Therapy Details Patient Name: Date of Service: Chad Chad [name] [facility] Chad [name] 08/02/2021 7:30 [place] Record Number: [id number] Patient Account Number: [id number] Date of Birth/Sex: Treating RN: 1966/02/04 (55 y.o. Chad Chad [name] Primary Care Carnella Fryman: PCP, NO Other Clinician: Referring Kahley Leib: Treating Takoda Siedlecki/Extender: Chad Chad [name] in Treatment: 0 Compression Therapy Performed for Wound Assessment: Wound #6 Left,Posterior Lower Leg Performed By: Clinician Chad Pilling, RN Compression Type: Three Layer Post Procedure Diagnosis Same as Pre-procedure Electronic Signature(s) Signed: 08/02/2021 5:43:37 PM By: Chad Chad [name] Entered By: Chad Chad [name] on 08/02/2021 08:31:48 -------------------------------------------------------------------------------- Encounter Discharge Information Details Patient Name: Date of Service: Chad Chad [name] 08/02/2021 7:30 [place] Record Number: [id number] Patient Account Number: [id number] Date of Birth/Sex: Treating RN: May 08, 1966 (55 y.o. Chad Chad [name] Primary Care Royer Cristobal: PCP, NO Other Clinician: Referring Janin Kozlowski: Treating Jaidalyn Schillo/Extender: Chad Chad [name] in Treatment: 0 Encounter Discharge Information Items Post Procedure Vitals Discharge Condition: Stable Temperature (F): 98.3 Ambulatory Status: Ambulatory Pulse (bpm): 76 Discharge Destination: Home Respiratory Rate (breaths/min): 20 Transportation: Private Auto Blood Pressure (mmHg): 190/110 Accompanied By: self Schedule Follow-up Appointment: Yes Clinical Summary of Care: Notes Explained to patient the importance of lowering his BP. Explained to recheck at home and if remains elevated to call PCP. Electronic Signature(s) Signed: 08/02/2021 5:43:37 PM By: Chad Chad [name] Entered By: Chad Chad [name] on 08/02/2021 08:39:58 -------------------------------------------------------------------------------- Lower Extremity Assessment Details Patient Name: Date of Service: Chad [name] [place] [facility] 08/02/2021 7:30 A M Medical Record Number: [id number] Patient Account Number: [id number] Date of  Birth/Sex: Treating RN: 01-21-1966 (55 y.o. Chad Chad [name] Primary Care Purvis Sidle: PCP, NO Other Clinician: Referring Shaurya Rawdon: Treating Mallisa Alameda/Extender: Chad Chad [name] in Treatment: 0 Edema Assessment Assessed: [Left: Yes] [Right: Yes] Edema: [Left: Yes] [Right: Yes] Calf Left: Right: Point of Measurement: 39 cm From Medial Instep 53 cm 58 cm Ankle Left: Right: Point of Measurement: 10 cm From Medial Instep 29 cm 29.5 cm Knee To Floor Left: Right: From Medial Instep 50 cm 50 cm Vascular Assessment Pulses: Dorsalis Pedis Palpable: [Left:Yes] [Right:Yes] Notes non compressible BLE 01/2021. Electronic Signature(s) Signed: 08/02/2021 5:43:37 PM By: Chad Chad [name] Entered By: Chad Chad [name] on 08/02/2021 08:07:04 -------------------------------------------------------------------------------- [place] Details Patient  Name: Date of Service: Chad Chad [name] [facility] 08/02/2021 7:30 [place] Record Number: [id number] Patient Account Number: [id number] Date of Birth/Sex: Treating RN: [birth year] (55 y.o. Chad Chad [name] Primary Care Ky Moskowitz: PCP, NO Other Clinician: Referring Shamirah Ivan: Treating Dillon Livermore/Extender: Chad Chad [name] in Treatment: 0 Active Inactive Orientation to the Wound Care Program Nursing Diagnoses: Knowledge deficit related to the wound healing Chad [name] program Goals: Patient/caregiver will verbalize understanding of the [facility] Program Date Initiated: 08/02/2021 [place] Resolution Date: 08/25/2021 Goal Status: Active Interventions: Provide education on orientation to the wound Chad [name] Notes: Pain, Acute or Chronic Nursing Diagnoses: Pain, acute or chronic: actual or potential Potential alteration in comfort, pain Goals: Patient will verbalize adequate pain control and receive pain control interventions during procedures as needed Date Initiated: 08/02/2021 Target Resolution Date: 08/25/2021 Goal Status: Active Patient/caregiver will verbalize comfort level met Date Initiated: 08/02/2021 Target Resolution Date: 08/25/2021 Goal Status: Active Interventions: Provide education on pain management Reposition patient for comfort Treatment Activities: Administer pain control measures as ordered : 08/02/2021 Notes: Wound/Skin Impairment Nursing Diagnoses: Knowledge deficit related to ulceration/compromised skin integrity Goals: Patient/caregiver will verbalize understanding of skin care regimen Date Initiated: 08/02/2021 Target Resolution Date: 08/25/2021 Goal Status: Active Interventions: Assess patient/caregiver ability to perform ulcer/skin care regimen upon admission and as needed Assess ulceration(s) every visit Provide education on smoking Provide education on ulcer and skin care Treatment Activities: Skin care regimen initiated : 08/02/2021 Topical  wound management initiated : 08/02/2021 Notes: Electronic Signature(s) Signed: 08/02/2021 5:43:37 PM By: Chad Chad [name] Entered By: Chad Chad [name] on 08/02/2021 08:25:00 -------------------------------------------------------------------------------- Pain Assessment Details Patient Name: Date of Service: Chad Chad [name] [name] 08/02/2021 7:30 [place] Record Number: [id number] Patient Account Number: [id number] Date of Birth/Sex: Treating RN: 12-04-1965 (55 y.o. Chad Chad [name] Primary Care Francy Mcilvaine: PCP, NO Other Clinician: Referring Zacheriah Stumpe: Treating Carlisa Eble/Extender: Chad Chad [name] in Treatment: 0 Active Problems Location of Pain Severity and Description of Pain Patient Has Paino Yes Site Locations Pain Location: Generalized Pain, Pain in Ulcers Rate the pain. Current Pain Level: 7 Worst Pain Level: 10 Least Pain Level: 0 Tolerable Pain Level: 8 Character of Pain Describe the Pain: Heavy, Sharp Pain Management and Medication Current Pain Management: Medication: Yes Cold Application: No Rest: Yes Massage: No Activity: No T.E.N.S.: No Heat Application: No Leg drop or elevation: No Is the Current Pain Management Adequate: Adequate How does your wound impact your activities of daily livingo Sleep: No Bathing: No Appetite: No Relationship With Others: No Bladder Continence: No Emotions: No Bowel Continence: No Work: No Toileting: No Drive: No Dressing: No Hobbies: Astronomer) Signed: 08/02/2021 5:43:37 PM By: Chad Chad [name] Entered By: Chad Chad [name] on 08/02/2021 08:15:45 -------------------------------------------------------------------------------- Patient/Caregiver Education Details Patient Name: Date of Service: [facility] [place]  [name] 11/16/2022andnbsp7:30 A M Medical Record Number: [id number] Patient Account Number: [id number] Date of Birth/Gender: Treating RN: 09/05/1966 (55 y.o. Chad Chad [name] Primary Care  Physician: PCP, NO Other Clinician: Referring Physician: Treating Physician/Extender: Chad Chad [name] in Treatment: 0 Education Assessment Education Provided To: Patient Education Topics Provided Venous: Handouts: Controlling Swelling with Compression Stockings , Controlling Swelling with Multilayered Compression Wraps Methods: Explain/Verbal Responses: Reinforcements needed Frederica: o Handouts: Welcome T [facility] [place] o Methods: Explain/Verbal Responses: Reinforcements needed Electronic Signature(s) Signed: 08/02/2021 5:43:37 PM By: Chad Chad [name] Entered By: Chad Chad [name] on 08/02/2021 08:25:15 -------------------------------------------------------------------------------- Wound Assessment Details Patient Name: Date of Service: Chad Chad [name] [facility] 08/02/2021 7:30 A M Medical Record Number: [id number] Patient Account Number: [id number] Date of Birth/Sex: Treating RN: 27-Nov-1965 (56 y.o. Chad Chad [name] Primary Care Joely Losier: PCP, NO Other Clinician: Referring Javarious Elsayed: Treating Cherae Marton/Extender: Chad Chad [name] in Treatment: 0 Wound Status Wound Number: 5 Primary Etiology: Lymphedema Wound Location: Left, Anterior Lower Leg Wound Status: Open Wounding Event: Gradually Appeared Comorbid History: Hypertension, Peripheral Venous Disease, Osteoarthritis Date Acquired: 07/26/2021 Weeks Of Treatment: 0 Clustered Wound: No Photos Wound Measurements Length: (cm) 0.6 Width: (cm) 0.8 Depth: (cm) 0.1 Area: (cm) 0.377 Volume: (cm) 0.038 % Reduction in Area: 0% % Reduction in Volume: 0% Epithelialization: None Tunneling: No Undermining: No Wound Description Classification: Full Thickness Without Exposed Support Structures Wound Margin: Distinct, outline attached Exudate Amount: Medium Exudate Type: Serosanguineous Exudate Color: red, brown Foul Odor After Cleansing: No Slough/Fibrino No Wound Bed Granulation  Amount: Large (67-100%) Exposed Structure Granulation Quality: Red Fascia Exposed: No Necrotic Amount: None Present (0%) Fat Layer (Subcutaneous Tissue) Exposed: Yes Tendon Exposed: No Muscle Exposed: No Joint Exposed: No Bone Exposed: No Treatment Notes Wound #5 (Lower Leg) Wound Laterality: Left, Anterior Cleanser Soap and Water Discharge Instruction: May shower and wash wound with dial antibacterial soap and water prior to dressing change. Wound Cleanser Discharge Instruction: Cleanse the wound with wound cleanser prior to applying a clean dressing using gauze sponges, not tissue or cotton balls. Peri-Wound Care Sween Lotion (Moisturizing lotion) Discharge Instruction: Apply moisturizing lotion as directed Topical Primary Dressing KerraCel Ag Gelling Fiber Dressing, 2x2 in (silver alginate) Discharge Instruction: Apply silver alginate to wound bed as instructed Secondary Dressing ABD Pad, 8x10 Discharge Instruction: Apply over primary dressing as directed. Secured With Compression Wrap ThreePress (3 layer compression wrap) Discharge Instruction: Apply three layer compression as directed. Compression Stockings Add-Ons Electronic Signature(s) Signed: 08/02/2021 5:43:37 PM By: Chad Chad [name] Entered By: Chad Chad [name] on 08/02/2021 08:12:59 -------------------------------------------------------------------------------- Wound Assessment Details Patient Name: Date of Service: Chad Chad [name] [facility] 08/02/2021 7:30 A M Medical Record Number: [id number] Patient Account Number: [id number] Date of Birth/Sex: Treating RN: [birth year] (55 y.o. Chad Chad [name] Primary Care Rhaya Coale: PCP, NO Other Clinician: Referring Alexanderjames Berg: Treating Zelia Yzaguirre/Extender: Chad Chad [name] in Treatment: 0 Wound Status Wound Number: 6 Primary Etiology: Lymphedema Wound Location: Left, Posterior Lower Leg Wound Status: Open Wounding Event: Gradually Appeared Comorbid History:  Hypertension, Peripheral Venous Disease, Osteoarthritis Date Acquired: 07/02/2021 Weeks Of Treatment: 0 Clustered Wound: No Photos Wound Measurements Length: (cm) 1.5 Width: (cm) 1.3 Depth: (cm) 0.1 Area: (cm) 1.532 Volume: (cm) 0.153 % Reduction in Area: 0% % Reduction in Volume: 0% Epithelialization: None Tunneling: No Undermining: No Wound Description Classification: Full Thickness Without Exposed Support Structures Wound Margin: Distinct, outline attached Exudate Amount: Medium Exudate Type: Serosanguineous Exudate Color: red, brown Foul  Odor After Cleansing: No Slough/Fibrino Yes Wound Bed Granulation Amount: Medium (34-66%) Exposed Structure Granulation Quality: Red, Pink Fascia Exposed: No Necrotic Amount: Medium (34-66%) Fat Layer (Subcutaneous Tissue) Exposed: Yes Necrotic Quality: Adherent Slough Tendon Exposed: No Muscle Exposed: No Joint Exposed: No Bone Exposed: No Treatment Notes Wound #6 (Lower Leg) Wound Laterality: Left, Posterior Cleanser Soap and Water Discharge Instruction: May shower and wash wound with dial antibacterial soap and water prior to dressing change. Wound Cleanser Discharge Instruction: Cleanse the wound with wound cleanser prior to applying a clean dressing using gauze sponges, not tissue or cotton balls. Peri-Wound Care Sween Lotion (Moisturizing lotion) Discharge Instruction: Apply moisturizing lotion as directed Topical Primary Dressing KerraCel Ag Gelling Fiber Dressing, 2x2 in (silver alginate) Discharge Instruction: Apply silver alginate to wound bed as instructed Secondary Dressing ABD Pad, 8x10 Discharge Instruction: Apply over primary dressing as directed. Secured With Compression Wrap ThreePress (3 layer compression wrap) Discharge Instruction: Apply three layer compression as directed. Compression Stockings Add-Ons Electronic Signature(s) Signed: 08/02/2021 5:43:37 PM By: Chad Chad [name] Entered By:  Chad Chad [name] on 08/02/2021 08:15:04 -------------------------------------------------------------------------------- Wound Assessment Details Patient Name: Date of Service: Chad Chad [name] [facility] 08/02/2021 7:30 A M Medical Record Number: [id number] Patient Account Number: [id number] Date of Birth/Sex: Treating RN: 1966/01/04 (55 y.o. Chad Chad [name] Primary Care Amrom Ore: PCP, NO Other Clinician: Referring Marielle Mantione: Treating Jakiya Bookbinder/Extender: Chad Chad [name] in Treatment: 0 Wound Status Wound Number: 7 Primary Etiology: Lymphedema Wound Location: Right, Anterior Lower Leg Wound Status: Open Wounding Event: Gradually Appeared Comorbid History: Hypertension, Peripheral Venous Disease, Osteoarthritis Date Acquired: 07/26/2021 Weeks Of Treatment: 0 Clustered Wound: Yes Wound Measurements Length: (cm) 1.8 Width: (cm) 2 Depth: (cm) 0.1 Clustered Quantity: 2 Area: (cm) 2.827 Volume: (cm) 0.283 % Reduction in Area: % Reduction in Volume: Epithelialization: None Tunneling: No Undermining: No Wound Description Classification: Full Thickness Without Exposed Support Structures Wound Margin: Distinct, outline attached Exudate Amount: Medium Exudate Type: Serosanguineous Exudate Color: red, brown Wound Bed Granulation Amount: Large (67-100%) Granulation Quality: Red Necrotic Amount: Small (1-33%) Necrotic Quality: Adherent Slough Foul Odor After Cleansing: No Slough/Fibrino Yes Exposed Structure Fascia Exposed: No Fat Layer (Subcutaneous Tissue) Exposed: Yes Tendon Exposed: No Muscle Exposed: No Joint Exposed: No Bone Exposed: No Treatment Notes Wound #7 (Lower Leg) Wound Laterality: Right, Anterior Cleanser Soap and Water Discharge Instruction: May shower and wash wound with dial antibacterial soap and water prior to dressing change. Wound Cleanser Discharge Instruction: Cleanse the wound with wound cleanser prior to applying a clean dressing using gauze  sponges, not tissue or cotton balls. Peri-Wound Care Sween Lotion (Moisturizing lotion) Discharge Instruction: Apply moisturizing lotion as directed Topical Primary Dressing KerraCel Ag Gelling Fiber Dressing, 2x2 in (silver alginate) Discharge Instruction: Apply silver alginate to wound bed as instructed Secondary Dressing ABD Pad, 8x10 Discharge Instruction: Apply over primary dressing as directed. Secured With Compression Wrap ThreePress (3 layer compression wrap) Discharge Instruction: Apply three layer compression as directed. Compression Stockings Add-Ons Electronic Signature(s) Signed: 08/02/2021 5:43:37 PM By: Chad Chad [name] Entered By: Chad Chad [name] on 08/02/2021 08:14:46 -------------------------------------------------------------------------------- Vitals Details Patient Name: Date of Service: Chad Chad [name] [facility] 08/02/2021 7:30 [place] Record Number: [id number] Patient Account Number: [id number] Date of Birth/Sex: Treating RN: Jan 19, 1966 (55 y.o. Chad Chad [name] Primary Care Jasten Guyette: PCP, NO Other Clinician: Referring Hillary Schwegler: Treating Jones Viviani/Extender: Chad Chad [name] in Treatment: 0 Vital Signs Time Taken: 07:50 Temperature (F): 98.3 Height (in): 76 Pulse (bpm): 91 Source: Stated Respiratory  Rate (breaths/min): 20 Weight (lbs): 500 Blood Pressure (mmHg): 203/96 Source: Stated Reference Range: 80 - 120 mg / dl Body Mass Index (BMI): 60.9 Notes Per patient in pain and has not taken BP medication. Patient brought pain medication and BP meds with him and is currently taking them. Electronic Signature(s) Signed: 08/02/2021 5:43:37 PM By: Chad Chad [name] Entered By: Chad Chad [name] on 08/02/2021 08:01:43

## 2021-08-02 NOTE — Progress Notes (Signed)
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 2cm width x 0.1cm depth; 2.827cm^2 area and 0.283cm^3 volume. There [name] Fat Layer (Subcutaneous Tissue) exposed. There [name] no tunneling or undermining noted. There [name] a medium amount of serosanguineous drainage noted. The wound margin [name] distinct with the outline attached to the wound base. There [name] large (67-100%) red granulation within the wound bed. There [name] a small (1-33%) amount of necrotic tissue within the wound bed including Adherent Slough. Assessment Active Problems ICD-10 Lymphedema, not elsewhere classified Venous insufficiency (chronic) (peripheral) Non-pressure chronic ulcer of other part of left lower leg with fat layer exposed Non-pressure chronic ulcer of other part of right lower leg with fat layer exposed Essential (primary) hypertension Procedures Wound #5  Pre-procedure diagnosis of Wound #5 [name] a Lymphedema located on the Left,Anterior Lower Leg . There was a Chemical/Enzymatic/Mechanical debridement performed by Chad [name].. Other agent used was gauze and wound cleanser. There was no bleeding. The procedure was tolerated well.  Post Debridement Measurements: 0.6cm length x 0.8cm width x 0.1cm depth; 0.038cm^3 volume. Character of Wound/Ulcer Post Debridement [name] stable. Post procedure Diagnosis Wound #5: Same as Pre-Procedure Pre-procedure diagnosis of Wound #5 [name] a Lymphedema located on the Left,Anterior Lower Leg . There was a Three Layer Compression Therapy Procedure by Chad [name]. Post procedure Diagnosis Wound #5: Same as Pre-Procedure Wound #7 Pre-procedure diagnosis of Wound #7 [name] a Lymphedema located on the Right,Anterior Lower Leg . There was a Chemical/Enzymatic/Mechanical debridement performed by Chad [name].. Other agent used was gauze and wound cleanser. There was no bleeding. The procedure was tolerated well. Post Debridement Measurements: 1.8cm length x 2cm width x 0.1cm depth; 0.283cm^3 volume. Character of Wound/Ulcer Post Debridement [name] stable. Post procedure Diagnosis Wound #7: Same as Pre-Procedure Pre-procedure diagnosis of Wound #7 [name] a Lymphedema located on the Right,Anterior Lower Leg . There was a Three Layer Compression Therapy Procedure by Chad [name]. Post procedure Diagnosis Wound #7: Same as Pre-Procedure Wound #6 Pre-procedure diagnosis of Wound #6 [name] a Lymphedema located on the Left,Posterior Lower Leg . There was a Three Layer Compression Therapy Procedure by Chad [name]. Post procedure Diagnosis Wound #6: Same as Pre-Procedure Plan Follow-up Appointments: Return Appointment in 1 week. Margarita Grizzle Wednesday Bathing/ Shower/ Hygiene: May shower with protection but do not get wound dressing(s) wet. Edema Control - Lymphedema / SCD / Other: Elevate legs to the level of the heart or above for 30 minutes daily and/or when sitting, a frequency of: - 3-4 times a day throughout the day Avoid standing for long periods of time. Exercise regularly The following medication(s) was prescribed: benzocaine topical 20 % aerosol aerosol topical applied in clinic for  debridements only. was prescribed at facility WOUND #5: - Lower Leg Wound Laterality: Left, Anterior Cleanser: Soap and Water Discharge Instructions: May shower and wash wound with dial antibacterial soap and water prior to dressing change. Cleanser: Wound Cleanser Discharge Instructions: Cleanse the wound with wound cleanser prior to applying a clean dressing using gauze sponges, not tissue or cotton balls. Peri-Wound Care: Sween Lotion (Moisturizing lotion) Discharge Instructions: Apply moisturizing lotion as directed Prim Dressing: KerraCel Ag Gelling Fiber Dressing, 2x2 in (silver alginate) ary Discharge Instructions: Apply silver alginate to wound bed as instructed Secondary Dressing: ABD Pad, 8x10 Discharge Instructions: Apply over primary dressing as directed. Com pression Wrap: ThreePress (3 layer compression wrap) Discharge Instructions: Apply three layer compression as directed. WOUND #6: - Lower Leg Wound Laterality: Left, Posterior Cleanser: Soap and Water Discharge Instructions: May shower and wash wound with dial antibacterial soap and water prior to dressing change. Cleanser: Wound Cleanser Discharge Instructions: Cleanse the wound with wound cleanser prior to applying a clean dressing using gauze sponges, not tissue or cotton balls. Peri-Wound Care: Sween Lotion (Moisturizing lotion) Discharge Instructions: Apply moisturizing lotion as directed Prim Dressing: KerraCel Ag Gelling Fiber Dressing, 2x2 in (silver alginate) ary Discharge Instructions: Apply silver alginate to wound bed as instructed Secondary Dressing: ABD Pad, 8x10 Discharge Instructions: Apply over primary dressing as directed. Com pression Wrap: ThreePress (3 layer compression wrap) Discharge Instructions: Apply three layer compression as directed. WOUND #7: - Lower Leg Wound Laterality: Right, Anterior Cleanser: Soap and Water Discharge Instructions: May shower and wash  wound with dial antibacterial soap  and water prior to dressing change. Cleanser: Wound Cleanser Discharge Instructions: Cleanse the wound with wound cleanser prior to applying a clean dressing using gauze sponges, not tissue or cotton balls. Peri-Wound Care: Sween Lotion (Moisturizing lotion) Discharge Instructions: Apply moisturizing lotion as directed Prim Dressing: KerraCel Ag Gelling Fiber Dressing, 2x2 in (silver alginate) ary Discharge Instructions: Apply silver alginate to wound bed as instructed Secondary Dressing: ABD Pad, 8x10 Discharge Instructions: Apply over primary dressing as directed. Compression Wrap: ThreePress (3 layer compression wrap) Discharge Instructions: Apply three layer compression as directed. 1. I do think that there are several options that need to be addressed here as far as treatment [name] concerned 1 [name] that his blood pressure [name] quite high I think he needs to monitor this at home if he still continues to have high readings when he gets home today as well he needs to call his doctor ASAP. Subsequently if he continues to have elevated readings over the next week even if they are not quite as high as what they are right now but nonetheless high anyway he should still contact his doctor to see what needs to be done with his medications. 2. Also can recommend at this time that we have the patient continue to elevate his legs [name] much as possible and exercise such as walking which I think [name] good as well. 3. I am also can recommend that the patient should be using compression all the time right now organ to be rapid healing but when we are not he should be wearing the Velcro compression wraps. 4. I am also can recommend that we look toward ordering lymphedema pumps I think this could be beneficial for him and he [name] actually interested in this and we just need to make sure that he [name] continuing to have compression in place in order to get this ordered. He also would does want [place] to order some  additional home Velcro compression wrap so that he has a spare to change out when he [name] washing 1 pair we can definitely do that as well. We will see patient back for reevaluation in 1 week here in the clinic. If anything worsens or changes patient will contact our office for additional recommendations. Electronic Signature(s) Signed: 08/02/2021 10:58:07 AM By: Chad Keeler [name] Entered By: Chad [name] on 08/02/2021 10:58:07 -------------------------------------------------------------------------------- HxROS Details Patient Name: Date of Service: Chad [name] [facility] 08/02/2021 7:30 A M Medical Record Number: [id number] Patient Account Number: [id number] Date of Birth/Sex: Treating [name]: 01/05/1966 (55 y.o. [name] Primary Care Provider: PCP, NO Other Clinician: Referring Provider: Treating Provider/Extender: Chad [name] in Treatment: 0 Information Obtained From Patient Constitutional Symptoms (General Health) Complaints and Symptoms: Negative for: Fatigue; Fever; Chills; Marked Weight Change Medical History: Past Medical History Notes: obestiy Eyes Complaints and Symptoms: Positive for: Glasses / Contacts Negative for: Dry Eyes; Vision Changes Medical History: Negative for: Cataracts; Glaucoma; Optic Neuritis Ear/Nose/Mouth/Throat Complaints and Symptoms: Negative for: Chronic sinus problems or rhinitis Medical History: Negative for: Chronic sinus problems/congestion; Middle ear problems Respiratory Complaints and Symptoms: Negative for: Chronic or frequent coughs; Shortness of Breath Medical History: Negative for: Aspiration; Asthma; Chronic Obstructive Pulmonary Disease (COPD); Pneumothorax; Sleep Apnea; Tuberculosis Cardiovascular Complaints and Symptoms: Negative for: Chest pain Medical History: Positive for: Hypertension; Peripheral Venous Disease Negative for: Angina; Arrhythmia; Congestive Heart Failure; Coronary Artery Disease; Deep Vein  Thrombosis; Hypotension; Myocardial Infarction; Peripheral Arterial Disease; Phlebitis; Vasculitis Gastrointestinal  Complaints and Symptoms: Negative for: Frequent diarrhea; Nausea; Vomiting Medical History: Negative for: Cirrhosis ; Colitis; Crohns; Hepatitis A; Hepatitis B; Hepatitis C Endocrine Complaints and Symptoms: Negative for: Heat/cold intolerance Medical History: Negative for: Type I Diabetes; Type II Diabetes Genitourinary Complaints and Symptoms: Negative for: Frequent urination Medical History: Negative for: End Stage Renal Disease Integumentary (Skin) Complaints and Symptoms: Positive for: Wounds - BLE Medical History: Negative for: History of Burn Musculoskeletal Complaints and Symptoms: Negative for: Muscle Pain; Muscle Weakness Medical History: Positive for: Osteoarthritis - left hip Negative for: Gout; Rheumatoid Arthritis; Osteomyelitis Neurologic Complaints and Symptoms: Negative for: Numbness/parasthesias Medical History: Negative for: Dementia; Neuropathy; Quadriplegia; Paraplegia; Seizure Disorder Psychiatric Complaints and Symptoms: Negative for: Claustrophobia; Suicidal Medical History: Negative for: Anorexia/bulimia; Confinement Anxiety Hematologic/Lymphatic Medical History: Negative for: Anemia; Hemophilia; Human Immunodeficiency Virus; Lymphedema; Sickle Cell Disease Immunological Medical History: Negative for: Lupus Erythematosus; Raynauds; Scleroderma Oncologic Medical History: Negative for: Received Chemotherapy; Received Radiation Immunizations Pneumococcal Vaccine: Received Pneumococcal Vaccination: No Implantable Devices None Family and Social History Cancer: Yes - Mother,Father; Never smoker; Marital Status - Married; Alcohol Use: Rarely; Drug Use: No History; Caffeine Use: Rarely; Financial Concerns: No; Food, Clothing or Shelter Needs: No; Support System Lacking: No; Transportation Concerns: No Electronic  Signature(s) Signed: 08/02/2021 4:59:49 PM By: Chad Keeler [name] Signed: 08/02/2021 5:43:37 PM By: Chad Pilling [name], [name] Entered By: Chad [name] on 08/02/2021 07:56:18 -------------------------------------------------------------------------------- SuperBill Details Patient Name: Date of Service: Chad [name] [facility] 08/02/2021 Medical Record Number: [id number] Patient Account Number: [id number] Date of Birth/Sex: Treating [name]: 01-Oct-1965 (55 y.o. [name] Primary Care Provider: PCP, NO Other Clinician: Referring Provider: Treating Provider/Extender: Chad [name] in Treatment: 0 Diagnosis Coding ICD-10 Codes Code Description I89.0 Lymphedema, not elsewhere classified I87.2 Venous insufficiency (chronic) (peripheral) L97.822 Non-pressure chronic ulcer of other part of left lower leg with fat layer exposed L97.812 Non-pressure chronic ulcer of other part of right lower leg with fat layer exposed [place] (primary) hypertension Facility Procedures CPT4 Code: [id number] Description: [id number] - WOUND CARE VISIT-LEV 3 EST PT Modifier: Quantity: 1 CPT4 Code: [id number] Description: [id number] - DEBRIDE W/O ANES NON SELECT Modifier: Quantity: 1 Physician Procedures : CPT4 Code Description Modifier [id number] - WC PHYS LEVEL 4 - EST PT ICD-10 Diagnosis Description I89.0 Lymphedema, not elsewhere classified I87.2 Venous insufficiency (chronic) (peripheral) L97.822 Non-pressure chronic ulcer of other part of left  lower leg with fat layer exposed L97.812 Non-pressure chronic ulcer of other part of right lower leg with fat layer exposed Quantity: 1 Electronic Signature(s) Signed: 08/02/2021 10:58:29 AM By: Chad Keeler [name] Entered By: Chad [name] on 08/02/2021 10:58:28

## 2021-08-02 NOTE — Progress Notes (Signed)
VERMON, GRAYS ([id number]) Visit Report for 08/02/2021 Abuse/Suicide Risk Screen Details Patient Name: Date of Service: Chad [name] [facility] 08/02/2021 7:30 A M Medical Record Number: [id number] Patient Account Number: [id number] Date of Birth/Sex: Treating RN: 08-20-1966 (55 y.o. [name] Primary Care Essynce Munsch: PCP, NO Other Clinician: Referring Crissa Sowder: Treating Indie Nickerson/Extender: [name] in Treatment: 0 Abuse/Suicide Risk Screen Items Answer ABUSE RISK SCREEN: Has anyone close to you tried to hurt or harm you recentlyo No Do you feel uncomfortable with anyone in your familyo No Has anyone forced you do things that you didnt want to doo No Electronic Signature(s) Signed: 08/02/2021 5:43:37 PM By: Shawn Stall RN, BSN Entered By: Shawn Stall on 08/02/2021 07:58:07 -------------------------------------------------------------------------------- Activities of Daily Living Details Patient Name: Date of Service: Chad [name] [facility] 08/02/2021 7:30 A M Medical Record Number: [id number] Patient Account Number: [id number] Date of Birth/Sex: Treating RN: 1966-07-13 (55 y.o. [name] Primary Care Monseratt Ledin: PCP, NO Other Clinician: Referring Janaia Kozel: Treating Candy Ziegler/Extender: [name] in Treatment: 0 Activities of Daily Living Items Answer Activities of Daily Living (Please select one for each item) Drive Automobile Completely Able T Medications ake Completely Able Use T elephone Completely Able Care for Appearance Completely Able Use T oilet Completely Able Bath / Shower Completely Able Dress Self Completely Able Feed Self Completely Able Walk Completely Able Get In / Out Bed Completely Able Housework Completely Able Prepare Meals Completely Able Handle Money Completely Able Shop for Self Completely Able Electronic Signature(s) Signed: 08/02/2021 5:43:37 PM By: Shawn Stall RN, BSN Entered By: Shawn Stall on 08/02/2021  07:58:28 -------------------------------------------------------------------------------- Education Screening Details Patient Name: Date of Service: Chad [name] [facility] 08/02/2021 7:30 A M Medical Record Number: [id number] Patient Account Number: [id number] Date of Birth/Sex: Treating RN: Jan 31, 1966 (55 y.o. [name] Primary Care Shai Rasmussen: PCP, NO Other Clinician: Referring Amyriah Buras: Treating Petula Rotolo/Extender: [name] in Treatment: 0 Primary Learner Assessed: Patient Learning Preferences/Education Level/Primary Language Learning Preference: Explanation, Demonstration, Printed Material Highest Education Level: High School Preferred Language: English Cognitive Barrier Language Barrier: No Translator Needed: No Memory Deficit: No Emotional Barrier: No Cultural/Religious Beliefs Affecting Medical Care: No Physical Barrier Impaired Vision: Yes Glasses Impaired Hearing: No Decreased Hand dexterity: No Knowledge/Comprehension Knowledge Level: High Comprehension Level: High Ability to understand written instructions: High Ability to understand verbal instructions: High Motivation Anxiety Level: Calm Cooperation: Cooperative Education Importance: Acknowledges Need Interest in Health Problems: Asks Questions Perception: Coherent Willingness to Engage in Self-Management High Activities: Readiness to Engage in Self-Management High Activities: Electronic Signature(s) Signed: 08/02/2021 5:43:37 PM By: Shawn Stall RN, BSN Entered By: Shawn Stall on 08/02/2021 07:59:52 -------------------------------------------------------------------------------- Fall Risk Assessment Details Patient Name: Date of Service: Chad [name] [facility] 08/02/2021 7:30 A M Medical Record Number: [id number] Patient Account Number: [id number] Date of Birth/Sex: Treating RN: 09-02-1966 (55 y.o. [name] Primary Care Gloris Shiroma: PCP, NO Other Clinician: Referring  Jia Mohamed: Treating Anjannette Gauger/Extender: [name] in Treatment: 0 Fall Risk Assessment Items Have you had 2 or more falls in the last 12 monthso 0 No Have you had any fall that resulted in injury in the last 12 monthso 0 No FALLS RISK SCREEN History of falling - immediate or within 3 months 0 No Secondary diagnosis (Do you have 2 or more medical diagnoseso) 0 No Ambulatory aid None/bed rest/wheelchair/nurse 0 Yes Crutches/cane/walker 0 No Furniture 0 No Intravenous therapy Access/Saline/Heparin Lock 0 No Gait/Transferring Normal/ bed rest/ wheelchair 0 Yes  Weak (short steps with or without shuffle, stooped but able to lift head while walking, may seek 0 No support from furniture) Impaired (short steps with shuffle, may have difficulty arising from chair, head down, impaired 0 No balance) Mental Status Oriented to own ability 0 Yes Electronic Signature(s) Signed: 08/02/2021 5:43:37 PM By: Shawn Stall RN, BSN Entered By: Shawn Stall on 08/02/2021 08:00:02 -------------------------------------------------------------------------------- Foot Assessment Details Patient Name: Date of Service: Chad [name] [facility] 08/02/2021 7:30 A M Medical Record Number: [id number] Patient Account Number: [id number] Date of Birth/Sex: Treating RN: 1966/03/18 (55 y.o. Harlon Flor, Millard.Loa Primary Care Treanna Dumler: PCP, NO Other Clinician: Referring Tameria Patti: Treating Maks Cavallero/Extender: [name] in Treatment: 0 Foot Assessment Items Site Locations + = Sensation present, - = Sensation absent, C = Callus, U = Ulcer R = Redness, W = Warmth, M = Maceration, PU = Pre-ulcerative lesion F = Fissure, S = Swelling, D = Dryness Assessment Right: Left: Other Deformity: No No Prior Foot Ulcer: No No Prior Amputation: No No Charcot Joint: No No Ambulatory Status: Ambulatory Without Help Gait: Steady Electronic Signature(s) Signed: 08/02/2021 5:43:37 PM By: Shawn Stall RN,  BSN Entered By: Shawn Stall on 08/02/2021 08:00:50 -------------------------------------------------------------------------------- Nutrition Risk Screening Details Patient Name: Date of Service: Chad [name] [facility] 08/02/2021 7:30 A M Medical Record Number: [id number] Patient Account Number: [id number] Date of Birth/Sex: Treating RN: [birth year] (55 y.o. [name] Primary Care Council Munguia: PCP, NO Other Clinician: Referring Fina Heizer: Treating Lavelle Berland/Extender: [name] in Treatment: 0 Height (in): 76 Weight (lbs): 500 Body Mass Index (BMI): 60.9 Nutrition Risk Screening Items Score Screening NUTRITION RISK SCREEN: I have an illness or condition that made me change the kind and/or amount of food I eat 2 Yes I eat fewer than two meals per day 0 No I eat few fruits and vegetables, or milk products 0 No I have three or more drinks of beer, liquor or wine almost every day 0 No I have tooth or mouth problems that make it hard for me to eat 0 No I don't always have enough money to buy the food I need 0 No I eat alone most of the time 0 No I take three or more different prescribed or over-the-counter drugs a day 1 Yes Without wanting to, I have lost or gained 10 pounds in the last six months 0 No I am not always physically able to shop, cook and/or feed myself 0 No Nutrition Protocols Good Risk Protocol Moderate Risk Protocol 0 Provide education on nutrition High Risk Proctocol Risk Level: Moderate Risk Score: 3 Electronic Signature(s) Signed: 08/02/2021 5:43:37 PM By: Shawn Stall RN, BSN Entered By: Shawn Stall on 08/02/2021 08:00:14

## 2021-08-09 ENCOUNTER — Other Ambulatory Visit: Payer: Self-pay

## 2021-08-09 ENCOUNTER — Encounter (HOSPITAL_BASED_OUTPATIENT_CLINIC_OR_DEPARTMENT_OTHER): Payer: Federal, State, Local not specified - PPO | Admitting: Physician Assistant

## 2021-08-09 DIAGNOSIS — L97822 Non-pressure chronic ulcer of other part of left lower leg with fat layer exposed: Secondary | ICD-10-CM | POA: Diagnosis not present

## 2021-08-09 NOTE — Progress Notes (Signed)
[name], [name] ([id number]) Visit Report for 08/09/2021 Arrival Information Details Patient Name: Date of Service: Chad [name] [facility] 08/09/2021 9:15 A M Medical Record Number: [id number] Patient Account Number: [id number] Date of Birth/Sex: Treating RN: 05-May-1966 (55 y.o. Chad [name] Primary Care Provider: PCP, NO Other Clinician: Referring Provider: Treating Provider/Extender: [name] in Treatment: 1 Visit Information History Since Last Visit Added or deleted any medications: No Patient Arrived: Ambulatory Any new allergies or adverse reactions: No Arrival Time: 09:26 Had a fall or experienced change in No Accompanied By: self activities of daily living that may affect Transfer Assistance: None risk of falls: Patient Identification Verified: Yes Signs or symptoms of abuse/neglect since last visito No Secondary Verification Process Completed: Yes Hospitalized since last visit: No Patient Requires Transmission-Based No Implantable device outside of the clinic excluding No Precautions: cellular tissue based products placed in the center Patient Has Alerts: Yes since last visit: Patient Alerts: non compressible BLE Has Dressing in Place as Prescribed: Yes 5/22 Has Compression in Place as Prescribed: Yes Pain Present Now: Yes Electronic Signature(s) Signed: 08/09/2021 5:48:49 PM By: [name] RN, BSN Entered By: [name] on 08/09/2021 09:27:16 -------------------------------------------------------------------------------- Compression Therapy Details Patient Name: Date of Service: Chad [name] [facility] 08/09/2021 9:15 A M Medical Record Number: [id number] Patient Account Number: [id number] Date of Birth/Sex: Treating RN: 15-Apr-1966 (55 y.o. Chad [name] Primary Care Provider: PCP, NO Other Clinician: Referring Provider: Treating Provider/Extender: [name] in Treatment: 1 Compression Therapy Performed for Wound Assessment: Wound  #10 Right,Proximal,Lateral Lower Leg Performed By: Clinician [name], RN Compression Type: Three Layer Post Procedure Diagnosis Same as Pre-procedure Electronic Signature(s) Signed: 08/09/2021 5:48:49 PM By: [name] RN, BSN Entered By: [name] on 08/09/2021 10:27:53 -------------------------------------------------------------------------------- Compression Therapy Details Patient Name: Date of Service: Chad [name] [facility] 08/09/2021 9:15 A M Medical Record Number: [id number] Patient Account Number: [id number] Date of Birth/Sex: Treating RN: June 26, 1966 (55 y.o. Chad [name] Primary Care Provider: PCP, NO Other Clinician: Referring Provider: Treating Provider/Extender: [name] in Treatment: 1 Compression Therapy Performed for Wound Assessment: Wound #5 Left,Anterior Lower Leg Performed By: Clinician [name], RN Compression Type: Three Layer Post Procedure Diagnosis Same as Pre-procedure Electronic Signature(s) Signed: 08/09/2021 5:48:49 PM By: [name] RN, BSN Entered By: [name] on 08/09/2021 10:27:53 -------------------------------------------------------------------------------- Compression Therapy Details Patient Name: Date of Service: Chad [name] [facility] 08/09/2021 9:15 A M Medical Record Number: [id number] Patient Account Number: [id number] Date of Birth/Sex: Treating RN: 1966/03/26 (55 y.o. Chad [name] Primary Care Provider: PCP, NO Other Clinician: Referring Provider: Treating Provider/Extender: [name] in Treatment: 1 Compression Therapy Performed for Wound Assessment: Wound #7 Right,Anterior Lower Leg Performed By: Clinician [name], RN Compression Type: Three Layer Post Procedure Diagnosis Same as Pre-procedure Electronic Signature(s) Signed: 08/09/2021 5:48:49 PM By: [name] RN, BSN Entered By: [name] on 08/09/2021  10:27:53 -------------------------------------------------------------------------------- Compression Therapy Details Patient Name: Date of Service: Chad [name] [facility] 08/09/2021 9:15 A M Medical Record Number: [id number] Patient Account Number: [id number] Date of Birth/Sex: Treating RN: 05-Jul-1966 (55 y.o. Chad [name] Primary Care Provider: PCP, NO Other Clinician: Referring Provider: Treating Provider/Extender: [name] in Treatment: 1 Compression Therapy Performed for Wound Assessment: Wound #8 Right,Lateral Lower Leg Performed By: Clinician [name], RN Compression Type: Three Layer Post Procedure Diagnosis Same as Pre-procedure Electronic Signature(s) Signed: 08/09/2021 5:48:49 PM By: [name] RN, BSN Entered By:  [name] on 08/09/2021 10:27:54 -------------------------------------------------------------------------------- Compression Therapy Details Patient Name: Date of Service: Chad [name] [facility] 08/09/2021 9:15 A M Medical Record Number: [id number] Patient Account Number: [id number] Date of Birth/Sex: Treating RN: 07-Jun-1966 (55 y.o. Chad [name] Primary Care Chad [name]: PCP, NO Other Clinician: Referring Chad [name]: Treating Chad [name]/Extender: [name] in Treatment: 1 Compression Therapy Performed for Wound Assessment: Wound #9 Right,Distal,Anterior Lower Leg Performed By: Clinician [name], RN Compression Type: Three Layer Post Procedure Diagnosis Same as Pre-procedure Electronic Signature(s) Signed: 08/09/2021 5:48:49 PM By: [name] RN, BSN Entered By: [name] on 08/09/2021 10:27:54 -------------------------------------------------------------------------------- Encounter Discharge Information Details Patient Name: Date of Service: Chad [name] [facility] 08/09/2021 9:15 A M Medical Record Number: [id number] Patient Account Number: [id number] Date of Birth/Sex: Treating RN: Jan 29, 1966 (55 y.o. Chad [name] Primary Care Chad [name]: PCP, NO Other Clinician: Referring Chad [name]: Treating Chad [name]/Extender: [name] in Treatment: 1 Encounter Discharge Information Items Discharge Condition: Stable Ambulatory Status: Ambulatory Discharge Destination: Home Transportation: Private Auto Accompanied By: self Schedule Follow-up Appointment: Yes Clinical Summary of Care: Electronic Signature(s) Signed: 08/09/2021 5:48:49 PM By: [name] RN, BSN Entered By: [name] on 08/09/2021 10:42:20 -------------------------------------------------------------------------------- Lower Extremity Assessment Details Patient Name: Date of Service: Chad [name] [facility] 08/09/2021 9:15 A M Medical Record Number: [id number] Patient Account Number: [id number] Date of Birth/Sex: Treating RN: 1966/08/28 (55 y.o. Chad [name] Primary Care Sebastiana Wuest: PCP, NO Other Clinician: Referring Shalece Staffa: Treating Isabeau Mccalla/Extender: [name] in Treatment: 1 Edema Assessment Assessed: [Left: Yes] [Right: Yes] Edema: [Left: Yes] [Right: Yes] Calf Left: Right: Point of Measurement: 39 cm From Medial Instep 53 cm 57 cm Ankle Left: Right: Point of Measurement: 10 cm From Medial Instep 27 cm 29 cm Knee To Floor Left: Right: From Medial Instep 50 cm 50 cm Vascular Assessment Pulses: Dorsalis Pedis Palpable: [Left:Yes] [Right:Yes] Electronic Signature(s) Signed: 08/09/2021 5:48:49 PM By: [name] RN, BSN Entered By: [name] on 08/09/2021 09:40:06 -------------------------------------------------------------------------------- Multi-Disciplinary Care Plan Details Patient Name: Date of Service: Chad [name] [facility] 08/09/2021 9:15 A M Medical Record Number: [id number] Patient Account Number: [id number] Date of Birth/Sex: Treating RN: March 20, 1966 (55 y.o. Chad [name] Primary Care Francessca Friis: PCP, NO Other Clinician: Referring Ersel Enslin: Treating  Toretto Tingler/Extender: [name] in Treatment: 1 Active Inactive Pain, Acute or Chronic Nursing Diagnoses: Pain, acute or chronic: actual or potential Potential alteration in comfort, pain Goals: Patient will verbalize adequate pain control and receive pain control interventions during procedures as needed Date Initiated: 08/02/2021 Target Resolution Date: 08/25/2021 Goal Status: Active Patient/caregiver will verbalize comfort level met Date Initiated: 08/02/2021 Target Resolution Date: 08/25/2021 Goal Status: Active Interventions: Provide education on pain management Reposition patient for comfort Treatment Activities: Administer pain control measures as ordered : 08/02/2021 Notes: Wound/Skin Impairment Nursing Diagnoses: Knowledge deficit related to ulceration/compromised skin integrity Goals: Patient/caregiver will verbalize understanding of skin care regimen Date Initiated: 08/02/2021 Target Resolution Date: 08/25/2021 Goal Status: Active Interventions: Assess patient/caregiver ability to perform ulcer/skin care regimen upon admission and as needed Assess ulceration(s) every visit Provide education on smoking Provide education on ulcer and skin care Treatment Activities: Skin care regimen initiated : 08/02/2021 Topical wound management initiated : 08/02/2021 Notes: Electronic Signature(s) Signed: 08/09/2021 5:48:49 PM By: [name] RN, BSN Entered By: [name] on 08/09/2021 09:49:46 -------------------------------------------------------------------------------- Pain Assessment Details Patient Name: Date of Service: Chad [name] [facility] 08/09/2021 9:15 A M Medical Record Number: [id number] Patient Account Number:  [id number] Date of Birth/Sex: Treating RN: June 23, 1966 (55 y.o. Chad [name] Primary Care Braxxton Stoudt: PCP, NO Other Clinician: Referring Symir Mah: Treating Kiyla Ringler/Extender: [name] in Treatment: 1 Active  Problems Location of Pain Severity and Description of Pain Patient Has Paino Yes Site Locations Pain Location: Generalized Pain Rate the pain. Current Pain Level: 7 Worst Pain Level: 10 Least Pain Level: 0 Tolerable Pain Level: 8 Pain Management and Medication Current Pain Management: Medication: No Cold Application: No Rest: No Massage: No Activity: No T.E.N.S.: No Heat Application: No Leg drop or elevation: No Is the Current Pain Management Adequate: Adequate How does your wound impact your activities of daily livingo Sleep: No Bathing: No Appetite: No Relationship With Others: No Bladder Continence: No Emotions: No Bowel Continence: No Work: No Toileting: No Drive: No Dressing: No Hobbies: No Engineer, maintenance) Signed: 08/09/2021 5:48:49 PM By: [name] RN, BSN Entered By: [name] on 08/09/2021 09:28:08 -------------------------------------------------------------------------------- Patient/Caregiver Education Details Patient Name: Date of Service: Chad [name] [name] 11/23/2022andnbsp9:15 A M Medical Record Number: [id number] Patient Account Number: [id number] Date of Birth/Gender: Treating RN: 11/08/1965 (55 y.o. Chad [name] Primary Care Physician: PCP, NO Other Clinician: Referring Physician: Treating Physician/Extender: [name] in Treatment: 1 Education Assessment Education Provided To: Patient Education Topics Provided Pain: Handouts: A Guide to Pain Control Methods: Explain/Verbal Responses: Reinforcements needed Electronic Signature(s) Signed: 08/09/2021 5:48:49 PM By: [name] RN, BSN Entered By: [name] on 08/09/2021 09:50:16 -------------------------------------------------------------------------------- Wound Assessment Details Patient Name: Date of Service: Chad [name] [facility] 08/09/2021 9:15 A M Medical Record Number: [id number] Patient Account Number: [id number] Date of Birth/Sex: Treating  RN: 1966/05/22 (55 y.o. Chad [name] Primary Care Zen Cedillos: PCP, NO Other Clinician: Referring Deloss Amico: Treating Dexton Zwilling/Extender: [name] in Treatment: 1 Wound Status Wound Number: 10 Primary Etiology: Lymphedema Wound Location: Right, Proximal, Lateral Lower Leg Wound Status: Open Wounding Event: Gradually Appeared Comorbid History: Hypertension, Peripheral Venous Disease, Osteoarthritis Date Acquired: 08/09/2021 Weeks Of Treatment: 0 Clustered Wound: No Photos Wound Measurements Length: (cm) 2.2 Width: (cm) 1.5 Depth: (cm) 0.1 Area: (cm) 2.592 Volume: (cm) 0.259 % Reduction in Area: 0% % Reduction in Volume: 0% Epithelialization: None Tunneling: No Undermining: No Wound Description Classification: Full Thickness Without Exposed Support Structures Wound Margin: Distinct, outline attached Exudate Amount: Medium Exudate Type: Serosanguineous Exudate Color: red, brown Foul Odor After Cleansing: No Slough/Fibrino Yes Wound Bed Granulation Amount: Medium (34-66%) Exposed Structure Granulation Quality: Red, Pink Fascia Exposed: No Necrotic Amount: Medium (34-66%) Fat Layer (Subcutaneous Tissue) Exposed: Yes Necrotic Quality: Adherent Slough Tendon Exposed: No Muscle Exposed: No Joint Exposed: No Bone Exposed: No Treatment Notes Wound #10 (Lower Leg) Wound Laterality: Right, Lateral, Proximal Cleanser Soap and Water Discharge Instruction: May shower and wash wound with dial antibacterial soap and water prior to dressing change. Wound Cleanser Discharge Instruction: Cleanse the wound with wound cleanser prior to applying a clean dressing using gauze sponges, not tissue or cotton balls. Peri-Wound Care Triamcinolone 15 (g) Discharge Instruction: mixed with lotion apply liberally.15 (g) as directed Sween Lotion (Moisturizing lotion) Discharge Instruction: Apply moisturizing lotion as directed Topical Primary Dressing KerraCel Ag Gelling Fiber  Dressing, 2x2 in (silver alginate) Discharge Instruction: Apply silver alginate to wound bed as instructed Secondary Dressing ABD Pad, 8x10 Discharge Instruction: Apply over primary dressing as directed. Secured With Compression Wrap ThreePress (3 layer compression wrap) Discharge Instruction: Apply three layer compression as directed. Compression Stockings Add-Ons Electronic Signature(s) Signed: 08/09/2021 5:48:49 PM By:  [name] RN, BSN Entered By: [name] on 08/09/2021 09:49:20 -------------------------------------------------------------------------------- Wound Assessment Details Patient Name: Date of Service: Chad [name] [facility] 08/09/2021 9:15 A M Medical Record Number: [id number] Patient Account Number: [id number] Date of Birth/Sex: Treating RN: 12/08/1965 (55 y.o. Chad [name] Primary Care Provider: PCP, NO Other Clinician: Referring Provider: Treating Provider/Extender: [name] in Treatment: 1 Wound Status Wound Number: 5 Primary Etiology: Lymphedema Wound Location: Left, Anterior Lower Leg Wound Status: Open Wounding Event: Gradually Appeared Comorbid History: Hypertension, Peripheral Venous Disease, Osteoarthritis Date Acquired: 07/26/2021 Weeks Of Treatment: 1 Clustered Wound: No Photos Wound Measurements Length: (cm) 0.1 Width: (cm) 0.1 Depth: (cm) 0.1 Area: (cm) 0.008 Volume: (cm) 0.001 % Reduction in Area: 97.9% % Reduction in Volume: 97.4% Epithelialization: Large (67-100%) Tunneling: No Undermining: No Wound Description Classification: Full Thickness Without Exposed Support Structures Wound Margin: Distinct, outline attached Exudate Amount: Small Exudate Type: Serosanguineous Exudate Color: red, brown Foul Odor After Cleansing: No Slough/Fibrino No Wound Bed Granulation Amount: Large (67-100%) Exposed Structure Granulation Quality: Red Fascia Exposed: No Necrotic Amount: None Present (0%) Fat Layer  (Subcutaneous Tissue) Exposed: Yes Tendon Exposed: No Muscle Exposed: No Joint Exposed: No Bone Exposed: No Treatment Notes Wound #5 (Lower Leg) Wound Laterality: Left, Anterior Cleanser Soap and Water Discharge Instruction: May shower and wash wound with dial antibacterial soap and water prior to dressing change. Wound Cleanser Discharge Instruction: Cleanse the wound with wound cleanser prior to applying a clean dressing using gauze sponges, not tissue or cotton balls. Peri-Wound Care Triamcinolone 15 (g) Discharge Instruction: mixed with lotion apply liberally.15 (g) as directed Sween Lotion (Moisturizing lotion) Discharge Instruction: Apply moisturizing lotion as directed Topical Primary Dressing KerraCel Ag Gelling Fiber Dressing, 2x2 in (silver alginate) Discharge Instruction: Apply silver alginate to wound bed as instructed Secondary Dressing ABD Pad, 8x10 Discharge Instruction: Apply over primary dressing as directed. Secured With Compression Wrap ThreePress (3 layer compression wrap) Discharge Instruction: Apply three layer compression as directed. Compression Stockings Circaid Juxta Lite Compression Wrap Quantity: 1 Left Leg Compression Amount: 30-40 mmHg Discharge Instruction: Apply Circaid Juxta Lite Compression Wrap daily as instructed. Apply first thing in the morning, remove at night before bed. Add-Ons Electronic Signature(s) Signed: 08/09/2021 5:48:49 PM By: [name] RN, BSN Entered By: [name] on 08/09/2021 09:50:41 -------------------------------------------------------------------------------- Wound Assessment Details Patient Name: Date of Service: Chad [name] [facility] 08/09/2021 9:15 A M Medical Record Number: [id number] Patient Account Number: [id number] Date of Birth/Sex: Treating RN: [birth year] (55 y.o. Chad [name] Primary Care Provider: PCP, NO Other Clinician: Referring Provider: Treating Provider/Extender: [name] in Treatment: 1 Wound Status Wound Number: 6 Primary Etiology: Lymphedema Wound Location: Left, Posterior Lower Leg Wound Status: Healed - Epithelialized Wounding Event: Gradually Appeared Comorbid History: Hypertension, Peripheral Venous Disease, Osteoarthritis Date Acquired: 07/02/2021 Weeks Of Treatment: 1 Clustered Wound: No Photos Wound Measurements Length: (cm) Width: (cm) Depth: (cm) Area: (cm) Volume: (cm) 0 % Reduction in Area: 100% 0 % Reduction in Volume: 100% 0 Epithelialization: Large (67-100%) 0 Tunneling: No 0 Undermining: No Wound Description Classification: Full Thickness Without Exposed Support Structures Wound Margin: Distinct, outline attached Exudate Amount: None Present Foul Odor After Cleansing: No Slough/Fibrino No Wound Bed Granulation Amount: None Present (0%) Exposed Structure Necrotic Amount: Medium (34-66%) Fascia Exposed: No Necrotic Quality: Adherent Slough Fat Layer (Subcutaneous Tissue) Exposed: No Tendon Exposed: No Muscle Exposed: No Joint Exposed: No Bone Exposed: No Electronic Signature(s) Signed: 08/09/2021 5:48:49 PM By: [name]  RN, BSN Entered By: [name] on 08/09/2021 09:51:33 -------------------------------------------------------------------------------- Wound Assessment Details Patient Name: Date of Service: Chad [name] [facility] 08/09/2021 9:15 A M Medical Record Number: [id number] Patient Account Number: [id number] Date of Birth/Sex: Treating RN: 1965/11/09 (55 y.o. Chad [name] Primary Care Provider: PCP, NO Other Clinician: Referring Provider: Treating Provider/Extender: [name] in Treatment: 1 Wound Status Wound Number: 7 Primary Etiology: Lymphedema Wound Location: Right, Anterior Lower Leg Wound Status: Open Wounding Event: Gradually Appeared Comorbid History: Hypertension, Peripheral Venous Disease, Osteoarthritis Date Acquired: 07/26/2021 Weeks Of Treatment:  1 Clustered Wound: Yes Photos Wound Measurements Length: (cm) 1.5 Width: (cm) 1 Depth: (cm) 0.1 Clustered Quantity: 1 Area: (cm) 1.178 Volume: (cm) 0.118 % Reduction in Area: 58.3% % Reduction in Volume: 58.3% Epithelialization: Small (1-33%) Tunneling: No Undermining: No Wound Description Classification: Full Thickness Without Exposed Support Structures Wound Margin: Distinct, outline attached Exudate Amount: Medium Exudate Type: Serosanguineous Exudate Color: red, brown Foul Odor After Cleansing: No Slough/Fibrino No Wound Bed Granulation Amount: Large (67-100%) Exposed Structure Granulation Quality: Red Fascia Exposed: No Necrotic Amount: None Present (0%) Fat Layer (Subcutaneous Tissue) Exposed: Yes Tendon Exposed: No Muscle Exposed: No Joint Exposed: No Bone Exposed: No Treatment Notes Wound #7 (Lower Leg) Wound Laterality: Right, Anterior Cleanser Soap and Water Discharge Instruction: May shower and wash wound with dial antibacterial soap and water prior to dressing change. Wound Cleanser Discharge Instruction: Cleanse the wound with wound cleanser prior to applying a clean dressing using gauze sponges, not tissue or cotton balls. Peri-Wound Care Triamcinolone 15 (g) Discharge Instruction: mixed with lotion apply liberally.15 (g) as directed Sween Lotion (Moisturizing lotion) Discharge Instruction: Apply moisturizing lotion as directed Topical Primary Dressing KerraCel Ag Gelling Fiber Dressing, 2x2 in (silver alginate) Discharge Instruction: Apply silver alginate to wound bed as instructed Secondary Dressing ABD Pad, 8x10 Discharge Instruction: Apply over primary dressing as directed. Secured With Compression Wrap ThreePress (3 layer compression wrap) Discharge Instruction: Apply three layer compression as directed. Compression Stockings Circaid Juxta Lite Compression Wrap Quantity: 1 Right Leg Compression Amount: 30-40 mmHg Discharge Instruction:  Apply Circaid Juxta Lite Compression Wrap daily as instructed. Apply first thing in the morning, remove at night before bed. Add-Ons Electronic Signature(s) Signed: 08/09/2021 5:48:49 PM By: [name] RN, BSN Entered By: [name] on 08/09/2021 09:52:00 -------------------------------------------------------------------------------- Wound Assessment Details Patient Name: Date of Service: Chad [name] [facility] 08/09/2021 9:15 A M Medical Record Number: [id number] Patient Account Number: [id number] Date of Birth/Sex: Treating RN: 1966-02-05 (55 y.o. Chad [name] Primary Care Provider: PCP, NO Other Clinician: Referring Provider: Treating Provider/Extender: [name] in Treatment: 1 Wound Status Wound Number: 8 Primary Etiology: Lymphedema Wound Location: Right, Lateral Lower Leg Wound Status: Open Wounding Event: Gradually Appeared Comorbid History: Hypertension, Peripheral Venous Disease, Osteoarthritis Date Acquired: 08/09/2021 Weeks Of Treatment: 0 Clustered Wound: Yes Photos Wound Measurements Length: (cm) 2.5 Width: (cm) 2.5 Depth: (cm) 0.1 Clustered Quantity: 2 Area: (cm) 4.909 Volume: (cm) 0.491 % Reduction in Area: 0% % Reduction in Volume: 0% Epithelialization: Small (1-33%) Tunneling: No Undermining: No Wound Description Classification: Full Thickness Without Exposed Support Structures Wound Margin: Distinct, outline attached Exudate Amount: Medium Exudate Type: Serosanguineous Exudate Color: red, brown Foul Odor After Cleansing: No Slough/Fibrino Yes Wound Bed Granulation Amount: Large (67-100%) Exposed Structure Granulation Quality: Red, Pink Fascia Exposed: No Necrotic Amount: Small (1-33%) Fat Layer (Subcutaneous Tissue) Exposed: Yes Necrotic Quality: Adherent Slough Tendon Exposed: No Muscle Exposed: No Joint Exposed: No Bone  Exposed: No Treatment Notes Wound #8 (Lower Leg) Wound Laterality: Right,  Lateral Cleanser Soap and Water Discharge Instruction: May shower and wash wound with dial antibacterial soap and water prior to dressing change. Wound Cleanser Discharge Instruction: Cleanse the wound with wound cleanser prior to applying a clean dressing using gauze sponges, not tissue or cotton balls. Peri-Wound Care Triamcinolone 15 (g) Discharge Instruction: mixed with lotion apply liberally.15 (g) as directed Sween Lotion (Moisturizing lotion) Discharge Instruction: Apply moisturizing lotion as directed Topical Primary Dressing KerraCel Ag Gelling Fiber Dressing, 2x2 in (silver alginate) Discharge Instruction: Apply silver alginate to wound bed as instructed Secondary Dressing ABD Pad, 8x10 Discharge Instruction: Apply over primary dressing as directed. Secured With Compression Wrap ThreePress (3 layer compression wrap) Discharge Instruction: Apply three layer compression as directed. Compression Stockings Add-Ons Electronic Signature(s) Signed: 08/09/2021 5:48:49 PM By: [name] RN, BSN Entered By: [name] on 08/09/2021 09:50:04 -------------------------------------------------------------------------------- Wound Assessment Details Patient Name: Date of Service: Chad [name] [facility] 08/09/2021 9:15 A M Medical Record Number: [id number] Patient Account Number: [id number] Date of Birth/Sex: Treating RN: Dec 16, 1965 (55 y.o. Chad [name] Primary Care Elisandra Deshmukh: PCP, NO Other Clinician: Referring Delitha Elms: Treating Aileena Iglesia/Extender: [name] in Treatment: 1 Wound Status Wound Number: 9 Primary Etiology: Lymphedema Wound Location: Right, Distal, Anterior Lower Leg Wound Status: Open Wounding Event: Gradually Appeared Comorbid History: Hypertension, Peripheral Venous Disease, Osteoarthritis Date Acquired: 08/09/2021 Weeks Of Treatment: 0 Clustered Wound: No Photos Wound Measurements Length: (cm) 1 Width: (cm) 1 Depth: (cm) 0.1 Area:  (cm) 0.785 Volume: (cm) 0.079 % Reduction in Area: 0% % Reduction in Volume: 0% Epithelialization: None Tunneling: No Undermining: No Wound Description Classification: Full Thickness Without Exposed Support Structures Wound Margin: Distinct, outline attached Exudate Amount: Medium Exudate Type: Serosanguineous Exudate Color: red, brown Foul Odor After Cleansing: No Slough/Fibrino No Wound Bed Granulation Amount: Large (67-100%) Exposed Structure Granulation Quality: Red, Pink Fascia Exposed: No Necrotic Amount: None Present (0%) Fat Layer (Subcutaneous Tissue) Exposed: Yes Tendon Exposed: No Muscle Exposed: No Joint Exposed: No Bone Exposed: No Treatment Notes Wound #9 (Lower Leg) Wound Laterality: Right, Anterior, Distal Cleanser Soap and Water Discharge Instruction: May shower and wash wound with dial antibacterial soap and water prior to dressing change. Wound Cleanser Discharge Instruction: Cleanse the wound with wound cleanser prior to applying a clean dressing using gauze sponges, not tissue or cotton balls. Peri-Wound Care Triamcinolone 15 (g) Discharge Instruction: mixed with lotion apply liberally.15 (g) as directed Sween Lotion (Moisturizing lotion) Discharge Instruction: Apply moisturizing lotion as directed Topical Primary Dressing KerraCel Ag Gelling Fiber Dressing, 2x2 in (silver alginate) Discharge Instruction: Apply silver alginate to wound bed as instructed Secondary Dressing ABD Pad, 8x10 Discharge Instruction: Apply over primary dressing as directed. Secured With Compression Wrap ThreePress (3 layer compression wrap) Discharge Instruction: Apply three layer compression as directed. Compression Stockings Add-Ons Electronic Signature(s) Signed: 08/09/2021 5:48:49 PM By: [name] RN, BSN Entered By: [name] on 08/09/2021 09:48:34 -------------------------------------------------------------------------------- Vitals Details Patient  Name: Date of Service: Chad [name] [facility] 08/09/2021 9:15 A M Medical Record Number: [id number] Patient Account Number: [id number] Date of Birth/Sex: Treating RN: October 26, 1965 (55 y.o. Chad [name] Primary Care Matina Rodier: PCP, NO Other Clinician: Referring Raelea Gosse: Treating Ashia Dehner/Extender: [name] in Treatment: 1 Vital Signs Time Taken: 09:25 Temperature (F): 98.3 Height (in): 76 Pulse (bpm): 88 Weight (lbs): 500 Respiratory Rate (breaths/min): 20 Body Mass Index (BMI): 60.9 Blood Pressure (mmHg): 187/95 Reference Range: 80 - 120  mg / dl Electronic Signature(s) Signed: 08/09/2021 5:48:49 PM By: [name] RN, BSN Entered By: [name] on 08/09/2021 09:39:47

## 2021-08-09 NOTE — Progress Notes (Addendum)
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 Gelling Fiber Dressing, 2x2 in (silver alginate) 1 x Per Week/30 Days ary Discharge Instructions: Apply silver alginate to wound bed as instructed Secondary Dressing: ABD Pad, 8x10 1 x Per Week/30 Days Discharge Instructions: Apply over primary dressing as directed. Com pression Wrap: ThreePress (3 layer compression wrap) 1 x Per Week/30 Days Discharge Instructions: Apply three layer compression as directed. WOUND #5: - Lower Leg Wound Laterality: Left, Anterior Cleanser: Soap and Water 1 x Per Week/30 Days Discharge Instructions: May shower and wash wound with dial antibacterial soap and water prior to dressing change. Cleanser: Wound Cleanser 1 x Per Week/30 Days Discharge Instructions: Cleanse the wound with wound cleanser prior to applying a clean dressing using gauze sponges, not tissue or cotton balls. Peri-Wound Care: Triamcinolone 15 (g) 1 x Per Week/30 Days Discharge Instructions: mixed with lotion apply liberally.15 (g) as directed Peri-Wound  Care: Sween Lotion (Moisturizing lotion) 1 x Per Week/30 Days Discharge Instructions: Apply moisturizing lotion as directed Prim Dressing: KerraCel Ag Gelling Fiber Dressing, 2x2 in (silver alginate) 1 x Per Week/30 Days ary Discharge Instructions: Apply silver alginate to wound bed as instructed Secondary Dressing: ABD Pad, 8x10 1 x Per Week/30 Days Discharge Instructions: Apply over primary dressing as directed. Com pression Wrap: ThreePress (3 layer compression wrap) 1 x Per Week/30 Days Discharge Instructions: Apply three layer compression as directed. Com pression Stockings: Circaid Juxta Lite Compression Wrap (DME) Compression Amount: 30-40 mmHg (left) Discharge Instructions: Apply Circaid Juxta Lite Compression Wrap daily as instructed. Apply first thing in the morning, remove at night before bed. WOUND #7: - Lower Leg Wound Laterality: Right, Anterior Cleanser: Soap and Water 1 x Per Week/30 Days Discharge Instructions: May shower and wash wound with dial antibacterial soap and water prior to dressing change. Cleanser: Wound Cleanser 1 x Per Week/30 Days Discharge Instructions: Cleanse the wound with wound cleanser prior to applying a clean dressing using gauze sponges, not tissue or cotton balls. Peri-Wound Care: Triamcinolone 15 (g) 1 x Per Week/30 Days Discharge Instructions: mixed with lotion apply liberally.15 (g) as directed Peri-Wound Care: Sween Lotion (Moisturizing lotion) 1 x Per Week/30 Days Discharge Instructions: Apply moisturizing lotion as directed Prim Dressing: KerraCel Ag Gelling Fiber Dressing, 2x2 in (silver alginate) 1 x Per Week/30 Days ary Discharge Instructions: Apply silver alginate to wound bed as instructed Secondary Dressing: ABD Pad, 8x10 1 x Per Week/30 Days Discharge Instructions: Apply over primary dressing as directed. Com pression Wrap: ThreePress (3 layer compression wrap) 1 x Per Week/30 Days Discharge Instructions: Apply three layer compression  as directed. Com pression Stockings: Circaid Juxta Lite Compression Wrap (DME) Compression Amount: 30-40 mmHg (right) Discharge Instructions: Apply  Circaid Juxta Lite Compression Wrap daily as instructed. Apply first thing in the morning, remove at night before bed. WOUND #8: - Lower Leg Wound Laterality: Right, Lateral Cleanser: Soap and Water 1 x Per Week/30 Days Discharge Instructions: May shower and wash wound with dial antibacterial soap and water prior to dressing change. Cleanser: Wound Cleanser 1 x Per Week/30 Days Discharge Instructions: Cleanse the wound with wound cleanser prior to applying a clean dressing using gauze sponges, not tissue or cotton balls. Peri-Wound Care: Triamcinolone 15 (g) 1 x Per Week/30 Days Discharge Instructions: mixed with lotion apply liberally.15 (g) as directed Peri-Wound Care: Sween Lotion (Moisturizing lotion) 1 x Per Week/30 Days Discharge Instructions: Apply moisturizing lotion as directed Prim Dressing: KerraCel Ag Gelling Fiber Dressing, 2x2 in (silver alginate) 1 x Per Week/30 Days ary Discharge Instructions: Apply silver alginate to wound bed as instructed Secondary Dressing: ABD Pad, 8x10 1 x Per Week/30 Days Discharge Instructions: Apply over primary dressing as directed. Com pression Wrap: ThreePress (3 layer compression wrap) 1 x Per Week/30 Days Discharge Instructions: Apply three layer compression as directed. WOUND #9: - Lower Leg Wound Laterality: Right, Anterior, Distal Cleanser: Soap and Water 1 x Per Week/30 Days Discharge Instructions: May shower and wash wound with dial antibacterial soap and water prior to dressing change. Cleanser: Wound Cleanser 1 x Per Week/30 Days Discharge Instructions: Cleanse the wound with wound cleanser prior to applying a clean dressing using gauze sponges, not tissue or cotton balls. Peri-Wound Care: Triamcinolone 15 (g) 1 x Per Week/30 Days Discharge Instructions: mixed with lotion apply liberally.15  (g) as directed Peri-Wound Care: Sween Lotion (Moisturizing lotion) 1 x Per Week/30 Days Discharge Instructions: Apply moisturizing lotion as directed Prim Dressing: KerraCel Ag Gelling Fiber Dressing, 2x2 in (silver alginate) 1 x Per Week/30 Days ary Discharge Instructions: Apply silver alginate to wound bed as instructed Secondary Dressing: ABD Pad, 8x10 1 x Per Week/30 Days Discharge Instructions: Apply over primary dressing as directed. Com pression Wrap: ThreePress (3 layer compression wrap) 1 x Per Week/30 Days Discharge Instructions: Apply three layer compression as directed. 1. Recommend that we going to continue with the wound care measures as before and the patient is in agreement with the plan. This includes the use of the silver alginate dressing to the open wound locations. 2. Also can use ABD pads to cover. 3. We will get a mix a amount of lotion along with a liberal amount of TCA to the legs help with itching we will also go ahead and continue with the 3 layer compression wrap the regular try cotton instead of the Curlex and see if that would be better for him. We will see patient back for reevaluation in 1 week here in the clinic. If anything worsens or changes patient will contact our office for additional recommendations. Electronic Signature(s) Signed: 08/09/2021 10:27:35 AM By: Lenda Kelp PA-C Entered By: Lenda Kelp on 08/09/2021 10:27:35 -------------------------------------------------------------------------------- SuperBill Details Patient Name: Date of Service: [name] 08/09/2021 Medical Record Number: [id number] Patient Account Number: [id number] Date of Birth/Sex: Treating RN: [birth year] (55 y.o. [name] Primary Care Provider: PCP, NO Other Clinician: Referring Provider: Treating Provider/Extender: [name] in Treatment: 1 Diagnosis Coding ICD-10 Codes Code Description I89.0 Lymphedema, not elsewhere  classified I87.2 Venous insufficiency (chronic) (peripheral) L97.822 Non-pressure chronic ulcer of other part of left lower leg with fat layer exposed L97.812 Non-pressure chronic ulcer of other part of right lower leg with fat layer  exposed I10 Essential (primary) hypertension Facility Procedures CPT4: Code [id number] foot Description: 1 BILATERAL: Application of multi-layer venous compression system; leg (below knee), including ankle and . Modifier: Quantity: 1 Physician Procedures Electronic Signature(s) Signed: 08/09/2021 5:33:12 PM By: Lenda Kelp PA-C Signed: 08/09/2021 5:48:49 PM By: Shawn Stall RN, BSN Previous Signature: 08/09/2021 10:27:46 AM Version By: Lenda Kelp PA-C Entered By: Shawn Stall on 08/09/2021 10:29:38

## 2021-08-16 ENCOUNTER — Other Ambulatory Visit: Payer: Self-pay

## 2021-08-16 ENCOUNTER — Encounter (HOSPITAL_BASED_OUTPATIENT_CLINIC_OR_DEPARTMENT_OTHER): Payer: Federal, State, Local not specified - PPO | Admitting: Physician Assistant

## 2021-08-16 DIAGNOSIS — L97822 Non-pressure chronic ulcer of other part of left lower leg with fat layer exposed: Secondary | ICD-10-CM | POA: Diagnosis not present

## 2021-08-16 NOTE — Progress Notes (Signed)
LANORRIS, KALISZ ([id number]) Visit Report for 08/16/2021 Arrival Information Details Patient Name: Date of Service: Chad [name] [facility] 08/16/2021 9:00 [place] Record Number: [id number] Patient Account Number: [id number] Date of Birth/Sex: Treating RN: 1965-11-26 (55 y.o. [name] Primary Care Vannak Montenegro: PCP, NO Other Clinician: Referring Dacey Milberger: Treating Jori Thrall/Extender: [name] in Treatment: 2 Visit Information History Since Last Visit Added or deleted any medications: No Patient Arrived: Ambulatory Any new allergies or adverse reactions: No Arrival Time: 09:15 Had a fall or experienced change in No Accompanied By: self activities of daily living that may affect Transfer Assistance: None risk of falls: Patient Identification Verified: Yes Signs or symptoms of abuse/neglect since last visito No Secondary Verification Process Completed: Yes Hospitalized since last visit: No Patient Requires Transmission-Based No Implantable device outside of the clinic excluding No Precautions: cellular tissue based products placed in the center Patient Has Alerts: Yes since last visit: Patient Alerts: non compressible BLE Has Dressing in Place as Prescribed: Yes 5/22 Has Compression in Place as Prescribed: Yes Pain Present Now: Yes Electronic Signature(s) Signed: 08/16/2021 5:54:55 PM By: [name] RN, BSN Entered By: [name] on 08/16/2021 09:19:07 -------------------------------------------------------------------------------- Compression Therapy Details Patient Name: Date of Service: Chad [name] [name] 08/16/2021 9:00 [place] Record Number: [id number] Patient Account Number: [id number] Date of Birth/Sex: Treating RN: September 02, 1966 (55 y.o. [name] Primary Care Nerissa Constantin: PCP, NO Other Clinician: Referring Macgregor Aeschliman: Treating Tanna Loeffler/Extender: [name] in Treatment: 2 Compression Therapy Performed for Wound Assessment: Wound  #10 Right,Proximal,Lateral Lower Leg Performed By: Clinician [name], RN Compression Type: Three Layer Post Procedure Diagnosis Same as Pre-procedure Electronic Signature(s) Signed: 08/16/2021 5:54:55 PM By: [name] RN, BSN Entered By: [name] on 08/16/2021 10:04:50 -------------------------------------------------------------------------------- Compression Therapy Details Patient Name: Date of Service: Chad [name] [facility] 08/16/2021 9:00 [place] Record Number: [id number] Patient Account Number: [id number] Date of Birth/Sex: Treating RN: 1966-08-18 (55 y.o. [name] Primary Care Ehtan Delfavero: PCP, NO Other Clinician: Referring Rayburn Mundis: Treating Prithvi Kooi/Extender: [name] in Treatment: 2 Compression Therapy Performed for Wound Assessment: Wound #12 Right,Distal,Lateral Lower Leg Performed By: Clinician [name], RN Compression Type: Three Layer Post Procedure Diagnosis Same as Pre-procedure Electronic Signature(s) Signed: 08/16/2021 5:54:55 PM By: [name] RN, BSN Entered By: [name] on 08/16/2021 10:04:50 -------------------------------------------------------------------------------- Compression Therapy Details Patient Name: Date of Service: Chad [name] [facility] 08/16/2021 9:00 [place] Record Number: [id number] Patient Account Number: [id number] Date of Birth/Sex: Treating RN: 12-08-1965 (55 y.o. [name] Primary Care Denzell Colasanti: PCP, NO Other Clinician: Referring Kyah Buesing: Treating Jameya Pontiff/Extender: [name] in Treatment: 2 Compression Therapy Performed for Wound Assessment: Wound #11 Left,Lateral Lower Leg Performed By: Clinician [name], RN Compression Type: Three Layer Post Procedure Diagnosis Same as Pre-procedure Electronic Signature(s) Signed: 08/16/2021 5:54:55 PM By: [name] RN, BSN Entered By: [name] on 08/16/2021  10:04:50 -------------------------------------------------------------------------------- Compression Therapy Details Patient Name: Date of Service: Chad [name] [facility] 08/16/2021 9:00 [place] Record Number: [id number] Patient Account Number: [id number] Date of Birth/Sex: Treating RN: [birth year] (55 y.o. [name] Primary Care Danh Bayus: PCP, NO Other Clinician: Referring Madylyn Insco: Treating Angelina Neece/Extender: [name] in Treatment: 2 Compression Therapy Performed for Wound Assessment: Wound #8 Right,Lateral Lower Leg Performed By: Clinician [name], RN Compression Type: Three Layer Post Procedure Diagnosis Same as Pre-procedure Electronic Signature(s) Signed: 08/16/2021 5:54:55 PM By: [name] RN, BSN Entered By:  [name] on 08/16/2021 10:04:51 -------------------------------------------------------------------------------- Encounter Discharge Information Details Patient Name: Date of Service: Chad [name] [facility] 08/16/2021 9:00 [place] Record Number: [id number] Patient Account Number: [id number] Date of Birth/Sex: Treating RN: 1966-02-02 (55 y.o. [name] Primary Care Maleak Brazzel: PCP, NO Other Clinician: Referring Maximino Cozzolino: Treating Farheen Pfahler/Extender: [name] in Treatment: 2 Encounter Discharge Information Items Discharge Condition: Stable Ambulatory Status: Ambulatory Discharge Destination: Home Transportation: Private Auto Schedule Follow-up Appointment: Yes Clinical Summary of Care: Electronic Signature(s) Signed: 08/16/2021 5:54:55 PM By: [name] RN, BSN Entered By: [name] on 08/16/2021 10:11:33 -------------------------------------------------------------------------------- Lower Extremity Assessment Details Patient Name: Date of Service: Chad [name] [facility] 08/16/2021 9:00 [place] Record Number: [id number] Patient Account Number: [id number] Date of Birth/Sex: Treating RN: 1966-04-16  (55 y.o. [name] Primary Care Cassadie Pankonin: PCP, NO Other Clinician: Referring Thia Olesen: Treating Kalla Watson/Extender: [name] in Treatment: 2 Edema Assessment Assessed: [Left: Yes] [Right: Yes] Edema: [Left: Yes] [Right: Yes] Calf Left: Right: Point of Measurement: 39 cm From Medial Instep 55 cm 53 cm Ankle Left: Right: Point of Measurement: 10 cm From Medial Instep 27 cm 25 cm Vascular Assessment Pulses: Dorsalis Pedis Palpable: [Left:Yes] [Right:Yes] Electronic Signature(s) Signed: 08/16/2021 5:54:55 PM By: [name] RN, BSN Entered By: [name] on 08/16/2021 09:22:53 -------------------------------------------------------------------------------- Multi-Disciplinary Care Plan Details Patient Name: Date of Service: Chad [name] [facility] 08/16/2021 9:00 A M Medical Record Number: [id number] Patient Account Number: [id number] Date of Birth/Sex: Treating RN: 1965/11/15 (55 y.o. [name] Primary Care Everard Interrante: PCP, NO Other Clinician: Referring Foye Haggart: Treating Jacorey Donaway/Extender: [name] in Treatment: 2 Active Inactive Pain, Acute or Chronic Nursing Diagnoses: Pain, acute or chronic: actual or potential Potential alteration in comfort, pain Goals: Patient will verbalize adequate pain control and receive pain control interventions during procedures as needed Date Initiated: 08/02/2021 Target Resolution Date: 08/25/2021 Goal Status: Active Patient/caregiver will verbalize comfort level met Date Initiated: 08/02/2021 Target Resolution Date: 08/25/2021 Goal Status: Active Interventions: Provide education on pain management Reposition patient for comfort Treatment Activities: Administer pain control measures as ordered : 08/02/2021 Notes: Wound/Skin Impairment Nursing Diagnoses: Knowledge deficit related to ulceration/compromised skin integrity Goals: Patient/caregiver will verbalize understanding of skin care  regimen Date Initiated: 08/02/2021 Target Resolution Date: 08/25/2021 Goal Status: Active Interventions: Assess patient/caregiver ability to perform ulcer/skin care regimen upon admission and as needed Assess ulceration(s) every visit Provide education on smoking Provide education on ulcer and skin care Treatment Activities: Skin care regimen initiated : 08/02/2021 Topical wound management initiated : 08/02/2021 Notes: Electronic Signature(s) Signed: 08/16/2021 5:54:55 PM By: [name] RN, BSN Entered By: [name] on 08/16/2021 09:42:28 -------------------------------------------------------------------------------- Pain Assessment Details Patient Name: Date of Service: Chad [name] [name] 08/16/2021 9:00 [place] Record Number: [id number] Patient Account Number: [id number] Date of Birth/Sex: Treating RN: 03-04-1966 (55 y.o. [name] Primary Care Swade Shonka: PCP, NO Other Clinician: Referring Diamone Whistler: Treating Atira Borello/Extender: [name] in Treatment: 2 Active Problems Location of Pain Severity and Description of Pain Patient Has Paino Yes Site Locations Pain Location: Pain in Ulcers Rate the pain. Current Pain Level: 6 Worst Pain Level: 10 Least Pain Level: 0 Tolerable Pain Level: 8 Pain Management and Medication Current Pain Management: Medication: No Cold Application: No Rest: No Massage: No Activity: No T.E.N.S.: No Heat Application: No Leg drop or elevation: No Is the Current Pain Management Adequate: Adequate How does your wound impact your activities of daily livingo  Sleep: No Bathing: No Appetite: No Relationship With Others: No Bladder Continence: No Emotions: No Bowel Continence: No Work: No Toileting: No Drive: No Dressing: No Hobbies: No Engineer, maintenance) Signed: 08/16/2021 5:54:55 PM By: [name] RN, BSN Entered By: [name] on 08/16/2021  09:19:32 -------------------------------------------------------------------------------- Patient/Caregiver Education Details Patient Name: Date of Service: Chad [name] [name] 11/30/2022andnbsp9:00 A M Medical Record Number: [id number] Patient Account Number: [id number] Date of Birth/Gender: Treating RN: 1966/06/23 (55 y.o. [name] Primary Care Physician: PCP, NO Other Clinician: Referring Physician: Treating Physician/Extender: [name] in Treatment: 2 Education Assessment Education Provided To: Patient Education Topics Provided Wound/Skin Impairment: Handouts: Skin Care Do's and Dont's Methods: Explain/Verbal Responses: Reinforcements needed Electronic Signature(s) Signed: 08/16/2021 5:54:55 PM By: [name] RN, BSN Entered By: [name] on 08/16/2021 09:42:50 -------------------------------------------------------------------------------- Wound Assessment Details Patient Name: Date of Service: Chad [name] [facility] 08/16/2021 9:00 [place] Record Number: [id number] Patient Account Number: [id number] Date of Birth/Sex: Treating RN: [birth year] (55 y.o. [name] Primary Care Amen Dargis: PCP, NO Other Clinician: Referring Estell Puccini: Treating Saleena Tamas/Extender: [name] in Treatment: 2 Wound Status Wound Number: 10 Primary Etiology: Lymphedema Wound Location: Right, Proximal, Lateral Lower Leg Wound Status: Open Wounding Event: Gradually Appeared Comorbid History: Hypertension, Peripheral Venous Disease, Osteoarthritis Date Acquired: 08/09/2021 Weeks Of Treatment: 1 Clustered Wound: No Photos Wound Measurements Length: (cm) 1 Width: (cm) 1.7 Depth: (cm) 0.1 Area: (cm) 1.335 Volume: (cm) 0.134 % Reduction in Area: 48.5% % Reduction in Volume: 48.3% Epithelialization: Medium (34-66%) Tunneling: No Undermining: No Wound Description Classification: Full Thickness Without Exposed Support Structures Wound Margin:  Distinct, outline attached Exudate Amount: Medium Exudate Type: Serosanguineous Exudate Color: red, brown Foul Odor After Cleansing: No Slough/Fibrino Yes Wound Bed Granulation Amount: Large (67-100%) Exposed Structure Granulation Quality: Red, Pink Fascia Exposed: No Necrotic Amount: Small (1-33%) Fat Layer (Subcutaneous Tissue) Exposed: Yes Necrotic Quality: Adherent Slough Tendon Exposed: No Muscle Exposed: No Joint Exposed: No Bone Exposed: No Treatment Notes Wound #10 (Lower Leg) Wound Laterality: Right, Lateral, Proximal Cleanser Soap and Water Discharge Instruction: May shower and wash wound with dial antibacterial soap and water prior to dressing change. Wound Cleanser Discharge Instruction: Cleanse the wound with wound cleanser prior to applying a clean dressing using gauze sponges, not tissue or cotton balls. Peri-Wound Care Triamcinolone 15 (g) Discharge Instruction: mixed with lotion apply liberally.15 (g) as directed Sween Lotion (Moisturizing lotion) Discharge Instruction: Apply moisturizing lotion as directed Topical Primary Dressing KerraCel Ag Gelling Fiber Dressing, 2x2 in (silver alginate) Discharge Instruction: Apply silver alginate to wound bed as instructed Secondary Dressing ABD Pad, 8x10 Discharge Instruction: Apply over primary dressing as directed. Secured With Compression Wrap ThreePress (3 layer compression wrap) Discharge Instruction: Apply three layer compression as directed. Compression Stockings Add-Ons Electronic Signature(s) Signed: 08/16/2021 3:47:30 PM By: Sandre Kitty Signed: 08/16/2021 5:54:55 PM By: [name] RN, BSN Entered By: Sandre Kitty on 08/16/2021 09:37:09 -------------------------------------------------------------------------------- Wound Assessment Details Patient Name: Date of Service: Chad [name] [facility] 08/16/2021 9:00 A M Medical Record Number: [id number] Patient Account Number: [id number] Date of  Birth/Sex: Treating RN: [birth year] (55 y.o. [name] Primary Care Ezmae Speers: PCP, NO Other Clinician: Referring Danese Dorsainvil: Treating Zinnia Tindall/Extender: [name] in Treatment: 2 Wound Status Wound Number: 11 Primary Etiology: Lymphedema Wound Location: Left, Lateral Lower Leg Wound Status: Open Wounding Event: Gradually Appeared Comorbid History: Hypertension, Peripheral Venous Disease, Osteoarthritis Date Acquired: 08/16/2021 Weeks Of Treatment: 0 Clustered Wound: No Photos  Wound Measurements Length: (cm) 0.3 Width: (cm) 0.4 Depth: (cm) 0.1 Area: (cm) 0.094 Volume: (cm) 0.009 % Reduction in Area: 0% % Reduction in Volume: 0% Epithelialization: Small (1-33%) Tunneling: No Undermining: No Wound Description Classification: Full Thickness Without Exposed Support Structures Wound Margin: Distinct, outline attached Exudate Amount: Medium Exudate Type: Serosanguineous Exudate Color: red, brown Foul Odor After Cleansing: No Slough/Fibrino No Wound Bed Granulation Amount: Large (67-100%) Exposed Structure Granulation Quality: Red Fascia Exposed: No Necrotic Amount: None Present (0%) Fat Layer (Subcutaneous Tissue) Exposed: Yes Tendon Exposed: No Muscle Exposed: No Joint Exposed: No Bone Exposed: No Treatment Notes Wound #11 (Lower Leg) Wound Laterality: Left, Lateral Cleanser Soap and Water Discharge Instruction: May shower and wash wound with dial antibacterial soap and water prior to dressing change. Wound Cleanser Discharge Instruction: Cleanse the wound with wound cleanser prior to applying a clean dressing using gauze sponges, not tissue or cotton balls. Peri-Wound Care Triamcinolone 15 (g) Discharge Instruction: mixed with lotion apply liberally.15 (g) as directed Sween Lotion (Moisturizing lotion) Discharge Instruction: Apply moisturizing lotion as directed Topical Primary Dressing KerraCel Ag Gelling Fiber Dressing, 2x2 in (silver  alginate) Discharge Instruction: Apply silver alginate to wound bed as instructed Secondary Dressing ABD Pad, 8x10 Discharge Instruction: Apply over primary dressing as directed. Secured With Compression Wrap ThreePress (3 layer compression wrap) Discharge Instruction: Apply three layer compression as directed. Compression Stockings Add-Ons Electronic Signature(s) Signed: 08/16/2021 3:47:30 PM By: Sandre Kitty Signed: 08/16/2021 5:54:55 PM By: [name] RN, BSN Entered By: Sandre Kitty on 08/16/2021 09:37:49 -------------------------------------------------------------------------------- Wound Assessment Details Patient Name: Date of Service: Chad [name] [facility] 08/16/2021 9:00 A M Medical Record Number: [id number] Patient Account Number: [id number] Date of Birth/Sex: Treating RN: 09/14/1966 (55 y.o. [name] Primary Care Florenda Watt: PCP, NO Other Clinician: Referring Carlen Rebuck: Treating Alean Kromer/Extender: [name] in Treatment: 2 Wound Status Wound Number: 12 Primary Etiology: Lymphedema Wound Location: Right, Distal, Lateral Lower Leg Wound Status: Open Wounding Event: Gradually Appeared Comorbid History: Hypertension, Peripheral Venous Disease, Osteoarthritis Date Acquired: 08/16/2021 Weeks Of Treatment: 0 Clustered Wound: No Photos Wound Measurements Length: (cm) 0.7 Width: (cm) 1.1 Depth: (cm) 0.1 Area: (cm) 0.605 Volume: (cm) 0.06 % Reduction in Area: 0% % Reduction in Volume: 0% Epithelialization: None Tunneling: No Undermining: No Wound Description Classification: Full Thickness Without Exposed Support Structures Wound Margin: Distinct, outline attached Exudate Amount: Medium Exudate Type: Serosanguineous Exudate Color: red, brown Foul Odor After Cleansing: No Slough/Fibrino No Wound Bed Granulation Amount: Large (67-100%) Exposed Structure Granulation Quality: Red Fascia Exposed: No Necrotic Amount: None Present  (0%) Fat Layer (Subcutaneous Tissue) Exposed: Yes Tendon Exposed: No Muscle Exposed: No Joint Exposed: No Bone Exposed: No Treatment Notes Wound #12 (Lower Leg) Wound Laterality: Right, Lateral, Distal Cleanser Soap and Water Discharge Instruction: May shower and wash wound with dial antibacterial soap and water prior to dressing change. Wound Cleanser Discharge Instruction: Cleanse the wound with wound cleanser prior to applying a clean dressing using gauze sponges, not tissue or cotton balls. Peri-Wound Care Triamcinolone 15 (g) Discharge Instruction: mixed with lotion apply liberally.15 (g) as directed Sween Lotion (Moisturizing lotion) Discharge Instruction: Apply moisturizing lotion as directed Topical Primary Dressing KerraCel Ag Gelling Fiber Dressing, 2x2 in (silver alginate) Discharge Instruction: Apply silver alginate to wound bed as instructed Secondary Dressing ABD Pad, 8x10 Discharge Instruction: Apply over primary dressing as directed. Secured With Compression Wrap ThreePress (3 layer compression wrap) Discharge Instruction: Apply three layer compression as directed. Compression Stockings Add-Ons Electronic Signature(s)  Signed: 08/16/2021 3:47:30 PM By: Sandre Kitty Signed: 08/16/2021 5:54:55 PM By: [name] RN, BSN Entered By: Sandre Kitty on 08/16/2021 09:38:29 -------------------------------------------------------------------------------- Wound Assessment Details Patient Name: Date of Service: Chad [name] [facility] 08/16/2021 9:00 A M Medical Record Number: [id number] Patient Account Number: [id number] Date of Birth/Sex: Treating RN: August 13, 1966 (55 y.o. [name] Primary Care Rashunda Passon: PCP, NO Other Clinician: Referring Elaine Middleton: Treating Craven Crean/Extender: [name] in Treatment: 2 Wound Status Wound Number: 5 Primary Etiology: Lymphedema Wound Location: Left, Anterior Lower Leg Wound Status: Open Wounding Event:  Gradually Appeared Comorbid History: Hypertension, Peripheral Venous Disease, Osteoarthritis Date Acquired: 07/26/2021 Weeks Of Treatment: 2 Clustered Wound: No Photos Wound Measurements Length: (cm) Width: (cm) Depth: (cm) Area: (cm) Volume: (cm) 0 % Reduction in Area: 100% 0 % Reduction in Volume: 100% 0 Epithelialization: Large (67-100%) 0 Tunneling: No 0 Undermining: No Wound Description Classification: Full Thickness Without Exposed Support Structures Wound Margin: Distinct, outline attached Exudate Amount: None Present Foul Odor After Cleansing: No Slough/Fibrino No Wound Bed Granulation Amount: None Present (0%) Exposed Structure Necrotic Amount: None Present (0%) Fascia Exposed: No Fat Layer (Subcutaneous Tissue) Exposed: Yes Tendon Exposed: No Muscle Exposed: No Joint Exposed: No Bone Exposed: No Electronic Signature(s) Signed: 08/16/2021 3:47:30 PM By: Sandre Kitty Signed: 08/16/2021 5:54:55 PM By: [name] RN, BSN Entered By: Sandre Kitty on 08/16/2021 09:40:01 -------------------------------------------------------------------------------- Wound Assessment Details Patient Name: Date of Service: Chad [name] [facility] 08/16/2021 9:00 A M Medical Record Number: [id number] Patient Account Number: [id number] Date of Birth/Sex: Treating RN: 1966-06-01 (55 y.o. [name] Primary Care Kennedey Digilio: PCP, NO Other Clinician: Referring Ellasyn Swilling: Treating Camdyn Beske/Extender: [name] in Treatment: 2 Wound Status Wound Number: 7 Primary Etiology: Lymphedema Wound Location: Right, Anterior Lower Leg Wound Status: Open Wounding Event: Gradually Appeared Comorbid History: Hypertension, Peripheral Venous Disease, Osteoarthritis Date Acquired: 07/26/2021 Weeks Of Treatment: 2 Clustered Wound: Yes Photos Wound Measurements Length: (cm) Width: (cm) Depth: (cm) Clustered Quantity: Area: (cm) Volume: (cm) 0 % Reduction in Area:  100% 0 % Reduction in Volume: 100% 0 Epithelialization: Large (67-100%) 1 Tunneling: No 0 Undermining: No 0 Wound Description Classification: Full Thickness Without Exposed Support Structures Wound Margin: Distinct, outline attached Exudate Amount: None Present Foul Odor After Cleansing: No Slough/Fibrino No Wound Bed Granulation Amount: None Present (0%) Exposed Structure Necrotic Amount: None Present (0%) Fascia Exposed: No Fat Layer (Subcutaneous Tissue) Exposed: Yes Tendon Exposed: No Muscle Exposed: No Joint Exposed: No Bone Exposed: No Electronic Signature(s) Signed: 08/16/2021 3:47:30 PM By: Sandre Kitty Signed: 08/16/2021 5:54:55 PM By: [name] RN, BSN Entered By: Sandre Kitty on 08/16/2021 09:40:53 -------------------------------------------------------------------------------- Wound Assessment Details Patient Name: Date of Service: Chad [name] [facility] 08/16/2021 9:00 A M Medical Record Number: [id number] Patient Account Number: [id number] Date of Birth/Sex: Treating RN: 05-02-1966 (55 y.o. [name] Primary Care Betzabe Bevans: PCP, NO Other Clinician: Referring Ermal Brzozowski: Treating Roxanna Mcever/Extender: [name] in Treatment: 2 Wound Status Wound Number: 8 Primary Etiology: Lymphedema Wound Location: Right, Lateral Lower Leg Wound Status: Open Wounding Event: Gradually Appeared Comorbid History: Hypertension, Peripheral Venous Disease, Osteoarthritis Date Acquired: 08/09/2021 Weeks Of Treatment: 1 Clustered Wound: Yes Photos Wound Measurements Length: (cm) 0.9 Width: (cm) 0.8 Depth: (cm) 0.1 Clustered Quantity: 1 Area: (cm) 0.5 Volume: (cm) 0.0 % Reduction in Area: 88.5% % Reduction in Volume: 88.4% Epithelialization: Medium (34-66%) Tunneling: No 65 Undermining: No 57 Wound Description Classification: Full Thickness Without Exposed Support Structures Wound Margin: Distinct, outline  attached Exudate Amount:  Medium Exudate Type: Serosanguineous Exudate Color: red, brown Foul Odor After Cleansing: No Slough/Fibrino No Wound Bed Granulation Amount: Large (67-100%) Exposed Structure Granulation Quality: Red, Pink Fascia Exposed: No Necrotic Amount: None Present (0%) Fat Layer (Subcutaneous Tissue) Exposed: Yes Tendon Exposed: No Muscle Exposed: No Joint Exposed: No Bone Exposed: No Treatment Notes Wound #8 (Lower Leg) Wound Laterality: Right, Lateral Cleanser Soap and Water Discharge Instruction: May shower and wash wound with dial antibacterial soap and water prior to dressing change. Wound Cleanser Discharge Instruction: Cleanse the wound with wound cleanser prior to applying a clean dressing using gauze sponges, not tissue or cotton balls. Peri-Wound Care Triamcinolone 15 (g) Discharge Instruction: mixed with lotion apply liberally.15 (g) as directed Sween Lotion (Moisturizing lotion) Discharge Instruction: Apply moisturizing lotion as directed Topical Primary Dressing KerraCel Ag Gelling Fiber Dressing, 2x2 in (silver alginate) Discharge Instruction: Apply silver alginate to wound bed as instructed Secondary Dressing ABD Pad, 8x10 Discharge Instruction: Apply over primary dressing as directed. Secured With Compression Wrap ThreePress (3 layer compression wrap) Discharge Instruction: Apply three layer compression as directed. Compression Stockings Add-Ons Electronic Signature(s) Signed: 08/16/2021 3:47:30 PM By: Sandre Kitty Signed: 08/16/2021 5:54:55 PM By: [name] RN, BSN Entered By: Sandre Kitty on 08/16/2021 09:39:08 -------------------------------------------------------------------------------- Wound Assessment Details Patient Name: Date of Service: Chad [name] [facility] 08/16/2021 9:00 A M Medical Record Number: [id number] Patient Account Number: [id number] Date of Birth/Sex: Treating RN: February 28, 1966 (55 y.o. [name] Primary Care Kenwood Rosiak:  PCP, NO Other Clinician: Referring Boston Catarino: Treating [place]/Extender: [name] in Treatment: 2 Wound Status Wound Number: [id number] Primary Etiology: Lymphedema Wound Location: Right, Distal, Anterior Lower Leg Wound Status: Open Wounding Event: Gradually Appeared Comorbid History: Hypertension, Peripheral Venous Disease, Osteoarthritis Date Acquired: 08/09/2021 Weeks Of Treatment: 1 Clustered Wound: No Photos Wound Measurements Length: (cm) Width: (cm) Depth: (cm) Area: (cm) Volume: (cm) 0 % Reduction in Area: 100% 0 % Reduction in Volume: 100% 0 Epithelialization: None 0 Tunneling: No 0 Undermining: No Wound Description Classification: Full Thickness Without Exposed Support Structures Wound Margin: Distinct, outline attached Exudate Amount: None Present Foul Odor After Cleansing: No Slough/Fibrino No Wound Bed Granulation Amount: Large (67-100%) Exposed Structure Granulation Quality: Red, Pink Fascia Exposed: No Necrotic Amount: None Present (0%) Fat Layer (Subcutaneous Tissue) Exposed: Yes Tendon Exposed: No Muscle Exposed: No Joint Exposed: No Bone Exposed: No Electronic Signature(s) Signed: 08/16/2021 3:47:30 PM By: Sandre Kitty Signed: 08/16/2021 5:54:55 PM By: [name] RN, BSN Entered By: Sandre Kitty on 08/16/2021 09:41:45 -------------------------------------------------------------------------------- Vitals Details Patient Name: Date of Service: Chad [name] [name] 08/16/2021 9:00 A M Medical Record Number: [id number] Patient Account Number: [id number] Date of Birth/Sex: Treating RN: 12-31-1965 (55 y.o. [name] Primary Care Cassidee Deats: PCP, NO Other Clinician: Referring Dhruva Orndoff: Treating Tuere Nwosu/Extender: [name] in Treatment: 2 Vital Signs Time Taken: 09:35 Temperature (F): 97.6 Height (in): 76 Pulse (bpm): 86 Weight (lbs): 500 Respiratory Rate (breaths/min): 22 Body Mass Index (BMI):  60.9 Blood Pressure (mmHg): 185/100 Reference Range: 80 - 120 mg / dl Notes Per patient took BP medication at night. Orlanda Frankum made aware. Electronic Signature(s) Signed: 08/16/2021 5:54:55 PM By: [name] RN, BSN Entered By: [name] on 08/16/2021 09:41:58

## 2021-08-16 NOTE — Progress Notes (Addendum)
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 Discharge Instructions: May shower and wash wound with dial antibacterial soap and water prior to dressing change. Cleanser: Wound Cleanser 1 x Per Week/30 Days Discharge Instructions: Cleanse the wound with wound cleanser prior to applying a clean dressing using gauze sponges, not tissue or cotton balls. Peri-Wound Care: Triamcinolone 15 (g) 1 x Per Week/30 Days Discharge Instructions: mixed with lotion apply liberally.15 (g) as directed Peri-Wound Care: Sween Lotion (Moisturizing lotion) 1 x Per Week/30 Days Discharge Instructions: Apply moisturizing lotion as directed Prim Dressing: KerraCel Ag Gelling Fiber Dressing, 2x2 in (silver alginate) 1 x Per Week/30 Days ary Discharge Instructions: Apply silver alginate to wound bed as instructed Secondary Dressing: ABD Pad, 8x10 1 x Per Week/30 Days Discharge Instructions: Apply over primary dressing as directed. Com pression Wrap: ThreePress (3 layer compression wrap) 1 x Per  Week/30 Days Discharge Instructions: Apply three layer compression as directed. WOUND #11: - Lower Leg Wound Laterality: Left, Lateral Cleanser: Soap and Water 1 x Per Week/30 Days Discharge Instructions: May shower and wash wound with dial antibacterial soap and water prior to dressing change. Cleanser: Wound Cleanser 1 x Per Week/30 Days Discharge Instructions: Cleanse the wound with wound cleanser prior to applying a clean dressing using gauze sponges, not tissue or cotton balls. Peri-Wound Care: Triamcinolone 15 (g) 1 x Per Week/30 Days Discharge Instructions: mixed with lotion apply liberally.15 (g) as directed Peri-Wound Care: Sween Lotion (Moisturizing lotion) 1 x Per Week/30 Days Discharge Instructions: Apply moisturizing lotion as directed Prim Dressing: KerraCel Ag Gelling Fiber Dressing, 2x2 in (silver alginate) 1 x Per Week/30 Days ary Discharge Instructions: Apply silver alginate to wound bed as instructed Secondary Dressing: ABD Pad, 8x10 1 x Per Week/30 Days Discharge Instructions: Apply over primary dressing as directed. Com pression Wrap: ThreePress (3 layer compression wrap) 1 x Per Week/30 Days Discharge Instructions: Apply three layer compression as directed. WOUND #12: - Lower Leg Wound Laterality: Right, Lateral, Distal Cleanser: Soap and Water 1 x Per Week/30 Days Discharge Instructions: May shower and wash wound with dial antibacterial soap and water prior to dressing change. Cleanser: Wound Cleanser 1 x Per Week/30 Days Discharge Instructions: Cleanse the wound with wound cleanser prior to applying a clean dressing using gauze sponges, not tissue or cotton balls. Peri-Wound Care: Triamcinolone 15 (g) 1 x Per Week/30 Days Discharge Instructions: mixed with lotion apply liberally.15 (g) as directed Peri-Wound Care: Sween Lotion (Moisturizing lotion) 1 x Per Week/30 Days  Discharge Instructions: Apply moisturizing lotion as directed Prim Dressing: KerraCel Ag Gelling  Fiber Dressing, 2x2 in (silver alginate) 1 x Per Week/30 Days ary Discharge Instructions: Apply silver alginate to wound bed as instructed Secondary Dressing: ABD Pad, 8x10 1 x Per Week/30 Days Discharge Instructions: Apply over primary dressing as directed. Com pression Wrap: ThreePress (3 layer compression wrap) 1 x Per Week/30 Days Discharge Instructions: Apply three layer compression as directed. WOUND #8: - Lower Leg Wound Laterality: Right, Lateral Cleanser: Soap and Water 1 x Per Week/30 Days Discharge Instructions: May shower and wash wound with dial antibacterial soap and water prior to dressing change. Cleanser: Wound Cleanser 1 x Per Week/30 Days Discharge Instructions: Cleanse the wound with wound cleanser prior to applying a clean dressing using gauze sponges, not tissue or cotton balls. Peri-Wound Care: Triamcinolone 15 (g) 1 x Per Week/30 Days Discharge Instructions: mixed with lotion apply liberally.15 (g) as directed Peri-Wound Care: Sween Lotion (Moisturizing lotion) 1 x Per Week/30 Days Discharge Instructions: Apply moisturizing lotion as directed Prim Dressing: KerraCel Ag Gelling Fiber Dressing, 2x2 in (silver alginate) 1 x Per Week/30 Days ary Discharge Instructions: Apply silver alginate to wound bed as instructed Secondary Dressing: ABD Pad, 8x10 1 x Per Week/30 Days Discharge Instructions: Apply over primary dressing as directed. Com pression Wrap: ThreePress (3 layer compression wrap) 1 x Per Week/30 Days Discharge Instructions: Apply three layer compression as directed. 1. Would recommend that we going to continue with the wound care measures as before and the patient is in agreement the plan this includes the use of the compression wraps which I think are doing a great job keeping edema under good control that is excellent news. 2. Also can recommend that we have the patient continue with the dressings as before and specifically we have been utilizing a silver  alginate dressing with ABD pads. We will see patient back for reevaluation in 1 week here in the clinic. If anything worsens or changes patient will contact our office for additional recommendations. Electronic Signature(s) Signed: 08/16/2021 10:07:38 AM By: Lenda Kelp PA-C Entered By: Lenda Kelp on 08/16/2021 10:07:37 -------------------------------------------------------------------------------- SuperBill Details Patient Name: Date of Service: [name] 08/16/2021 Medical Record Number: [id number] Patient Account Number: [id number] Date of Birth/Sex: Treating RN: 1966-05-26 (55 y.o. [name] Primary Care Provider: PCP, NO Other Clinician: Referring Provider: Treating Provider/Extender: [name] in Treatment: 2 Diagnosis Coding ICD-10 Codes Code Description I89.0 Lymphedema, not elsewhere classified I87.2 Venous insufficiency (chronic) (peripheral) L97.822 Non-pressure chronic ulcer of other part of left lower leg with fat layer exposed L97.812 Non-pressure chronic ulcer of other part of right lower leg with fat layer exposed I10 Essential (primary) hypertension Facility Procedures CPT4: Code [id number] foot Description: 1 BILATERAL: Application of multi-layer venous compression system; leg (below knee), including ankle and . Modifier: Quantity: 1 Physician Procedures : CPT4 Code Description Modifier [id number] - WC PHYS LEVEL 3 - EST PT ICD-10 Diagnosis Description I89.0 Lymphedema, not elsewhere classified I87.2 Venous insufficiency (chronic) (peripheral) L97.822 Non-pressure chronic ulcer of other part of left  lower leg with fat layer exposed L97.812 Non-pressure chronic ulcer of other part of right lower leg with fat layer exposed Quantity: 1 Electronic Signature(s) Signed: 08/16/2021 10:07:53 AM By: Lenda Kelp PA-C Entered By: Lenda Kelp on 08/16/2021 10:07:52

## 2021-08-23 ENCOUNTER — Other Ambulatory Visit: Payer: Self-pay

## 2021-08-23 ENCOUNTER — Encounter (HOSPITAL_BASED_OUTPATIENT_CLINIC_OR_DEPARTMENT_OTHER): Payer: Federal, State, Local not specified - PPO | Attending: Physician Assistant | Admitting: Physician Assistant

## 2021-08-23 DIAGNOSIS — L97812 Non-pressure chronic ulcer of other part of right lower leg with fat layer exposed: Secondary | ICD-10-CM | POA: Insufficient documentation

## 2021-08-23 DIAGNOSIS — I872 Venous insufficiency (chronic) (peripheral): Secondary | ICD-10-CM | POA: Diagnosis not present

## 2021-08-23 DIAGNOSIS — L97822 Non-pressure chronic ulcer of other part of left lower leg with fat layer exposed: Secondary | ICD-10-CM | POA: Diagnosis not present

## 2021-08-23 DIAGNOSIS — I890 Lymphedema, not elsewhere classified: Secondary | ICD-10-CM | POA: Diagnosis not present
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
## 2021-08-23 NOTE — Progress Notes (Signed)
[name], [name] ([id number]) Visit Report for 08/23/2021 Arrival Information Details Patient Name: Date of Service: Chad [name] [facility] 08/23/2021 8:00 A M Medical Record Number: [id number] Patient Account Number: [id number] Date of Birth/Sex: Treating RN: 06-18-1966 (55 y.o. Lytle Michaels Primary Care Yanely Mast: PCP, NO Other Clinician: Referring Julee Stoll: Treating Addysen Louth/Extender: [name] in Treatment: 3 Visit Information History Since Last Visit Added or deleted any medications: No Patient Arrived: Ambulatory Any new allergies or adverse reactions: No Arrival Time: 08:09 Had a fall or experienced change in No Transfer Assistance: None activities of daily living that may affect Patient Identification Verified: Yes risk of falls: Secondary Verification Process Completed: Yes Signs or symptoms of abuse/neglect since last visito No Patient Requires Transmission-Based No Hospitalized since last visit: No Precautions: Implantable device outside of the clinic excluding No Patient Has Alerts: Yes cellular tissue based products placed in the center Patient Alerts: non compressible BLE since last visit: 5/22 Has Dressing in Place as Prescribed: Yes Has Compression in Place as Prescribed: Yes Pain Present Now: No Electronic Signature(s) Signed: 08/23/2021 4:54:52 PM By: Antonieta Iba Entered By: Antonieta Iba on 08/23/2021 08:10:51 -------------------------------------------------------------------------------- Encounter Discharge Information Details Patient Name: Date of Service: Chad [name] [facility] 08/23/2021 8:00 A M Medical Record Number: [id number] Patient Account Number: [id number] Date of Birth/Sex: Treating RN: May 27, 1966 (55 y.o. Lytle Michaels Primary Care Jayen Bromwell: PCP, NO Other Clinician: Referring Sabrina Keough: Treating Corrin Hingle/Extender: [name] in Treatment: 3 Encounter Discharge Information Items Discharge Condition:  Stable Ambulatory Status: Ambulatory Discharge Destination: Home Transportation: Private Auto Schedule Follow-up Appointment: Yes Clinical Summary of Care: Provided on 08/23/2021 Form Type Recipient Paper Patient Patient Electronic Signature(s) Signed: 08/23/2021 4:54:52 PM By: Antonieta Iba Entered By: Antonieta Iba on 08/23/2021 09:30:01 -------------------------------------------------------------------------------- Lower Extremity Assessment Details Patient Name: Date of Service: Chad [name] [facility] 08/23/2021 8:00 A M Medical Record Number: [id number] Patient Account Number: [id number] Date of Birth/Sex: Treating RN: 04/18/1966 (55 y.o. Lytle Michaels Primary Care Naria Abbey: PCP, NO Other Clinician: Referring Dawood Spitler: Treating Jarom Govan/Extender: [name] in Treatment: 3 Edema Assessment Assessed: [Left: Yes] [Right: Yes] Edema: [Left: Yes] [Right: Yes] Calf Left: Right: Point of Measurement: 39 cm From Medial Instep 50 cm 53 cm Ankle Left: Right: Point of Measurement: 10 cm From Medial Instep 27 cm 27.2 cm Vascular Assessment Pulses: Dorsalis Pedis Palpable: [Left:Yes] [Right:Yes] Electronic Signature(s) Signed: 08/23/2021 4:54:52 PM By: Antonieta Iba Entered By: Antonieta Iba on 08/23/2021 08:20:39 -------------------------------------------------------------------------------- Multi-Disciplinary Care Plan Details Patient Name: Date of Service: Chad [name] [facility] 08/23/2021 8:00 A M Medical Record Number: [id number] Patient Account Number: [id number] Date of Birth/Sex: Treating RN: 08-Apr-1966 (55 y.o. Lytle Michaels Primary Care Rolondo Pierre: PCP, NO Other Clinician: Referring Karene Bracken: Treating Nnamdi Dacus/Extender: [name] in Treatment: 3 Active Inactive Wound/Skin Impairment Nursing Diagnoses: Knowledge deficit related to ulceration/compromised skin integrity Goals: Patient/caregiver will verbalize understanding of skin care  regimen Date Initiated: 08/02/2021 Target Resolution Date: 09/13/2021 Goal Status: Active Interventions: Assess patient/caregiver ability to perform ulcer/skin care regimen upon admission and as needed Assess ulceration(s) every visit Provide education on smoking Provide education on ulcer and skin care Treatment Activities: Skin care regimen initiated : 08/02/2021 Topical wound management initiated : 08/02/2021 Notes: Electronic Signature(s) Signed: 08/23/2021 8:34:52 AM By: Antonieta Iba Entered By: Antonieta Iba on 08/23/2021 08:34:51 -------------------------------------------------------------------------------- Pain Assessment Details Patient Name: Date of Service: Chad [name] [facility] 08/23/2021 8:00 A M Medical Record Number: [id number] Patient Account Number:  [id number] Date of Birth/Sex: Treating RN: 1966-05-19 (55 y.o. Lytle Michaels Primary Care Karry Causer: PCP, NO Other Clinician: Referring Sonnie Bias: Treating Tarin Johndrow/Extender: [name] in Treatment: 3 Active Problems Location of Pain Severity and Description of Pain Patient Has Paino No Site Locations Pain Management and Medication Current Pain Management: Electronic Signature(s) Signed: 08/23/2021 4:54:52 PM By: Antonieta Iba Entered By: Antonieta Iba on 08/23/2021 08:13:28 -------------------------------------------------------------------------------- Patient/Caregiver Education Details Patient Name: Date of Service: Chad [name] [name] 12/7/2022andnbsp8:00 A M Medical Record Number: [id number] Patient Account Number: [id number] Date of Birth/Gender: Treating RN: [birth year] (55 y.o. Lytle Michaels Primary Care Physician: PCP, NO Other Clinician: Referring Physician: Treating Physician/Extender: [name] in Treatment: 3 Education Assessment Education Provided To: Patient Education Topics Provided Venous: Methods: Explain/Verbal, Printed Responses: State content  correctly Wound/Skin Impairment: Methods: Explain/Verbal, Printed Responses: State content correctly Electronic Signature(s) Signed: 08/23/2021 4:54:52 PM By: Antonieta Iba Entered By: Antonieta Iba on 08/23/2021 08:35:38 -------------------------------------------------------------------------------- Wound Assessment Details Patient Name: Date of Service: Chad [name] [facility] 08/23/2021 8:00 A M Medical Record Number: [id number] Patient Account Number: [id number] Date of Birth/Sex: Treating RN: [birth year] (55 y.o. Lytle Michaels Primary Care Shem Plemmons: PCP, NO Other Clinician: Referring Layton Tappan: Treating Jeremaih Klima/Extender: [name] in Treatment: 3 Wound Status Wound Number: 10 Primary Etiology: Lymphedema Wound Location: Right, Proximal, Lateral Lower Leg Wound Status: Healed - Epithelialized Wounding Event: Gradually Appeared Comorbid History: Hypertension, Peripheral Venous Disease, Osteoarthritis Date Acquired: 08/09/2021 Weeks Of Treatment: 2 Clustered Wound: No Photos Wound Measurements Length: (cm) Width: (cm) Depth: (cm) Area: (cm) Volume: (cm) 0 % Reduction in Area: 100% 0 % Reduction in Volume: 100% 0 0 0 Wound Description Classification: Full Thickness Without Exposed Support Structur Electronic Signature(s) Signed: 08/23/2021 4:54:52 PM By: Antonieta Iba Entered By: Talbot Grumbling on 08/23/2021 09:05:00 -------------------------------------------------------------------------------- Wound Assessment Details Patient Name: Date of Service: Chad [name] [facility] 08/23/2021 8:00 A M Medical Record Number: [id number] Patient Account Number: [id number] Date of Birth/Sex: Treating RN: 01/15/1966 (55 y.o. Lytle Michaels Primary Care Shardee Dieu: PCP, NO Other Clinician: Referring Shari Natt: Treating Genae Strine/Extender: [name] in Treatment: 3 Wound Status Wound Number: 11 Primary Etiology: Lymphedema Wound Location: Left,  Lateral Lower Leg Wound Status: Healed - Epithelialized Wounding Event: Gradually Appeared Comorbid History: Hypertension, Peripheral Venous Disease, Osteoarthritis Date Acquired: 08/16/2021 Weeks Of Treatment: 1 Clustered Wound: No Photos Wound Measurements Length: (cm) Width: (cm) Depth: (cm) Area: (cm) Volume: (cm) 0 % Reduction in Area: 100% 0 % Reduction in Volume: 100% 0 0 0 Wound Description Classification: Full Thickness Without Exposed Support Structur es Electronic Signature(s) Signed: 08/23/2021 4:54:52 PM By: Antonieta Iba Entered By: Antonieta Iba on 08/23/2021 09:05:21 -------------------------------------------------------------------------------- Wound Assessment Details Patient Name: Date of Service: Chad [name] [name] 08/23/2021 8:00 A M Medical Record Number: [id number] Patient Account Number: [id number] Date of Birth/Sex: Treating RN: 23-May-1966 (55 y.o. Lytle Michaels Primary Care Kharisma Glasner: PCP, NO Other Clinician: Referring Madilynn Montante: Treating Kareem Cathey/Extender: [name] in Treatment: 3 Wound Status Wound Number: 12 Primary Etiology: Lymphedema Wound Location: Right, Distal, Lateral Lower Leg Wound Status: Open Wounding Event: Gradually Appeared Comorbid History: Hypertension, Peripheral Venous Disease, Osteoarthritis Date Acquired: 08/16/2021 Weeks Of Treatment: 1 Clustered Wound: No Photos Wound Measurements Length: (cm) 0.4 Width: (cm) 0.5 Depth: (cm) 0.1 Area: (cm) 0.157 Volume: (cm) 0.016 % Reduction in Area: 74% % Reduction in Volume: 73.3% Epithelialization: Medium (34-66%) Tunneling: No Undermining: No Wound Description Classification: Full  Thickness Without Exposed Support Structures Wound Margin: Distinct, outline attached Exudate Amount: Medium Exudate Type: Serosanguineous Exudate Color: red, brown Foul Odor After Cleansing: No Slough/Fibrino No Wound Bed Granulation Amount: Large (67-100%)  Exposed Structure Granulation Quality: Red Fascia Exposed: No Necrotic Amount: None Present (0%) Fat Layer (Subcutaneous Tissue) Exposed: Yes Tendon Exposed: No Muscle Exposed: No Joint Exposed: No Bone Exposed: No Treatment Notes Wound #12 (Lower Leg) Wound Laterality: Right, Lateral, Distal Cleanser Soap and Water Discharge Instruction: May shower and wash wound with dial antibacterial soap and water prior to dressing change. Wound Cleanser Discharge Instruction: Cleanse the wound with wound cleanser prior to applying a clean dressing using gauze sponges, not tissue or cotton balls. Peri-Wound Care Triamcinolone 15 (g) Discharge Instruction: mixed with lotion apply liberally.15 (g) as directed Sween Lotion (Moisturizing lotion) Discharge Instruction: Apply moisturizing lotion as directed Topical Primary Dressing KerraCel Ag Gelling Fiber Dressing, 2x2 in (silver alginate) Discharge Instruction: Apply silver alginate to wound bed as instructed Secondary Dressing ABD Pad, 8x10 Discharge Instruction: Apply over primary dressing as directed. Secured With Compression Wrap ThreePress (3 layer compression wrap) Discharge Instruction: Apply three layer compression as directed. Compression Stockings Add-Ons Electronic Signature(s) Signed: 08/23/2021 4:54:52 PM By: Antonieta Iba Entered By: Antonieta Iba on 08/23/2021 08:31:28 -------------------------------------------------------------------------------- Wound Assessment Details Patient Name: Date of Service: Chad [name] [name] 08/23/2021 8:00 A M Medical Record Number: [id number] Patient Account Number: [id number] Date of Birth/Sex: Treating RN: [birth year] (55 y.o. Lytle Michaels Primary Care Aliyanah Rozas: PCP, NO Other Clinician: Referring Lagretta Loseke: Treating Edrick Whitehorn/Extender: [name] in Treatment: 3 Wound Status Wound Number: 8 Primary Etiology: Lymphedema Wound Location: Right, Lateral Lower Leg Wound  Status: Healed - Epithelialized Wounding Event: Gradually Appeared Comorbid History: Hypertension, Peripheral Venous Disease, Osteoarthritis Date Acquired: 08/09/2021 Weeks Of Treatment: 2 Clustered Wound: Yes Photos Wound Measurements Length: (cm) 0 Width: (cm) 0 Depth: (cm) 0 Area: (cm) 0 Volume: (cm) 0 % Reduction in Area: 100% % Reduction in Volume: 100% Wound Description Classification: Full Thickness Without Exposed Support Structur es Electronic Signature(s) Signed: 08/23/2021 4:54:52 PM By: Antonieta Iba Entered By: Antonieta Iba on 08/23/2021 09:06:12 -------------------------------------------------------------------------------- Vitals Details Patient Name: Date of Service: Chad [name] [name] 08/23/2021 8:00 A M Medical Record Number: [id number] Patient Account Number: [id number] Date of Birth/Sex: Treating RN: December 24, 1965 (55 y.o. Lytle Michaels Primary Care Bricia Taher: PCP, NO Other Clinician: Referring Tunis Gentle: Treating Offie Waide/Extender: [name] in Treatment: 3 Vital Signs Time Taken: 08:12 Temperature (F): 98.2 Height (in): 76 Pulse (bpm): 99 Weight (lbs): 500 Respiratory Rate (breaths/min): 20 Body Mass Index (BMI): 60.9 Blood Pressure (mmHg): 173/101 Reference Range: 80 - 120 mg / dl Electronic Signature(s) Signed: 08/23/2021 4:54:52 PM By: Antonieta Iba Entered By: Antonieta Iba on 08/23/2021 08:13:07

## 2021-08-30 ENCOUNTER — Encounter (HOSPITAL_BASED_OUTPATIENT_CLINIC_OR_DEPARTMENT_OTHER): Payer: Federal, State, Local not specified - PPO | Admitting: Physician Assistant

## 2021-08-30 ENCOUNTER — Other Ambulatory Visit: Payer: Self-pay

## 2021-08-30 DIAGNOSIS — L97812 Non-pressure chronic ulcer of other part of right lower leg with fat layer exposed: Secondary | ICD-10-CM | POA: Diagnosis not present

## 2021-08-30 NOTE — Progress Notes (Addendum)
TRAMAYNE, SEBESTA ([id number]) Visit Report for 08/30/2021 Arrival Information Details Patient Name: Date of Service: Chad [name] [facility] 08/30/2021 8:00 A M Medical Record Number: [id number] Patient Account Number: [id number] Date of Birth/Sex: Treating RN: 07-01-1966 (55 y.o. Chad [name] Primary Care Chad [name]: PCP, NO Other Clinician: Referring Chad [name]: Treating Chad [name]/Extender: Chad [name] in Treatment: 4 Visit Information History Since Last Visit Added or deleted any medications: No Patient Arrived: Ambulatory Any new allergies or adverse reactions: No Arrival Time: 08:04 Had a fall or experienced change in No Accompanied By: self activities of daily living that may affect Transfer Assistance: None risk of falls: Patient Identification Verified: Yes Signs or symptoms of abuse/neglect since last visito No Secondary Verification Process Completed: Yes Hospitalized since last visit: No Patient Requires Transmission-Based No Implantable device outside of the clinic excluding No Precautions: cellular tissue based products placed in the center Patient Has Alerts: Yes since last visit: Patient Alerts: non compressible BLE Has Dressing in Place as Prescribed: Yes 5/22 Has Compression in Place as Prescribed: Yes Pain Present Now: Yes Electronic Signature(s) Signed: 08/30/2021 4:24:09 PM By: [name] RN, BSN Entered By: [name] on 08/30/2021 08:05:09 -------------------------------------------------------------------------------- Clinic Level of Care Assessment Details Patient Name: Date of Service: Chad [name] [facility] 08/30/2021 8:00 A M Medical Record Number: [id number] Patient Account Number: [id number] Date of Birth/Sex: Treating RN: [birth year] (55 y.o. Chad [name] Primary Care Chad [name]: PCP, NO Other Clinician: Referring Chad [name]: Treating Chad [name]/Extender: Chad [name] in Treatment: 4 Clinic Level of Care Assessment  Items TOOL 4 Quantity Score []  - 0 Use when only an EandM is performed on FOLLOW-UP visit ASSESSMENTS - Nursing Assessment / Reassessment X- 1 10 Reassessment of Co-morbidities (includes updates in patient status) X- 1 5 Reassessment of Adherence to Treatment Plan ASSESSMENTS - Wound and Skin A ssessment / Reassessment X - Simple Wound Assessment / Reassessment - one wound 1 5 []  - 0 Complex Wound Assessment / Reassessment - multiple wounds X- 1 10 Dermatologic / Skin Assessment (not related to wound area) ASSESSMENTS - Focused Assessment X- 2 5 Circumferential Edema Measurements - multi extremities []  - 0 Nutritional Assessment / Counseling / Intervention X- 1 5 Lower Extremity Assessment (monofilament, tuning fork, pulses) []  - 0 Peripheral Arterial Disease Assessment (using hand held doppler) ASSESSMENTS - Ostomy and/or Continence Assessment and Care []  - 0 Incontinence Assessment and Management []  - 0 Ostomy Care Assessment and Management (repouching, etc.) PROCESS - Coordination of Care X - Simple Patient / Family Education for ongoing care 1 15 []  - 0 Complex (extensive) Patient / Family Education for ongoing care X- 1 10 Staff obtains , Records, T Results / Process Orders est []  - 0 Staff telephones HHA, Nursing Homes / Clarify orders / etc []  - 0 Routine Transfer to another Facility (non-emergent condition) []  - 0 Routine Hospital Admission (non-emergent condition) []  - 0 New Admissions / / Ordering NPWT Apligraf, etc. , []  - 0 Emergency Hospital Admission (emergent condition) X- 1 10 Simple Discharge Coordination []  - 0 Complex (extensive) Discharge Coordination PROCESS - Special Needs []  - 0 Pediatric / Minor Patient Management []  - 0 Isolation Patient Management []  - 0 Hearing / Language / Visual special needs []  - 0 Assessment of Community assistance (transportation, D/C planning, etc.) []  - 0 Additional  assistance / Altered mentation []  - 0 Support Surface(s) Assessment (bed, cushion, seat, etc.) INTERVENTIONS - Wound Cleansing / Measurement X - Simple Wound Cleansing -  one wound 1 5 []  - 0 Complex Wound Cleansing - multiple wounds X- 1 5 Wound Imaging (photographs - any number of wounds) []  - 0 Wound Tracing (instead of photographs) []  - 0 Simple Wound Measurement - one wound []  - 0 Complex Wound Measurement - multiple wounds INTERVENTIONS - Wound Dressings []  - 0 Small Wound Dressing one or multiple wounds []  - 0 Medium Wound Dressing one or multiple wounds []  - 0 Large Wound Dressing one or multiple wounds []  - 0 Application of Medications - topical []  - 0 Application of Medications - injection INTERVENTIONS - Miscellaneous []  - 0 External ear exam []  - 0 Specimen Collection (cultures, biopsies, blood, body fluids, etc.) []  - 0 Specimen(s) / Culture(s) sent or taken to Lab for analysis []  - 0 Patient Transfer (multiple staff / / Similar devices) []  - 0 Simple Staple / Suture removal (25 or less) []  - 0 Complex Staple / Suture removal (26 or more) []  - 0 Hypo / Hyperglycemic Management (close monitor of Blood Glucose) []  - 0 Ankle / Brachial Index (ABI) - do not check if billed separately X- 1 5 Vital Signs Has the patient been seen at the hospital within the last three years: Yes Total Score: 95 Level Of Care: New/Established - Level 3 Electronic Signature(s) Signed: 08/30/2021 4:24:09 PM By: RN, BSN Entered By: on 08/30/2021 08:37:17 -------------------------------------------------------------------------------- Encounter Discharge Information Details Patient Name: Date of Service: [facility] 08/30/2021 8:00 A M Medical Record Number: Patient Account Number: Date of Birth/Sex: Treating RN: Aug 18, 1966 (55 y.o. Primary Care Chad [name]: PCP, NO Other  Clinician: Referring Chad [name]: Treating Chad [name]/Extender: Chad [name] in Treatment: 4 Encounter Discharge Information Items Discharge Condition: Stable Ambulatory Status: Ambulatory Discharge Destination: Home Transportation: Private Auto Accompanied By: self Schedule Follow-up Appointment: Yes Clinical Summary of Care: Patient Declined Electronic Signature(s) Signed: 08/30/2021 4:24:09 PM By: RN, BSN Entered By: on 08/30/2021 08:47:27 -------------------------------------------------------------------------------- Lower Extremity Assessment Details Patient Name: Date of Service: 09/01/2021 [facility] 08/30/2021 8:00 A M Medical Record Number: 09/01/2021 Patient Account Number: Chad [name] Date of Birth/Sex: Treating RN: 02/26/1966 (54 y.o. [id number] Primary Care Ellise Kovack: PCP, NO Other Clinician: Referring Merrill Villarruel: Treating Orlene Salmons/Extender: [id number] in Treatment: 4 Edema Assessment Assessed: [Left: No] [Right: No] Edema: [Left: Yes] [Right: Yes] Calf Left: Right: Point of Measurement: 39 cm From Medial Instep 54.5 cm 50.5 cm Ankle Left: Right: Point of Measurement: 10 cm From Medial Instep 27.5 cm 28.5 cm Knee To Floor Left: Right: From Medial Instep 51 cm 51 cm Vascular Assessment Pulses: Dorsalis Pedis Palpable: [Left:Yes] [Right:Yes] Electronic Signature(s) Signed: 08/30/2021 4:24:09 PM By: 53 RN, BSN Entered By: Chad [name] on 08/30/2021 08:38:11 -------------------------------------------------------------------------------- Multi-Disciplinary Care Plan Details Patient Name: Date of Service: 09/01/2021 [facility] 08/30/2021 8:00 A M Medical Record Number: 09/01/2021 Patient Account Number: Chad [name] Date of Birth/Sex: Treating RN: 1966/01/30 (55 y.o. [id number] Primary Care Trishna Cwik: PCP, NO Other Clinician: Referring Manolito Jurewicz: Treating Chino Sardo/Extender: [id number] in Treatment: 4 Active Inactive Electronic Signature(s) Signed: 08/30/2021 4:24:09 PM By: 53 RN, BSN Entered By: Chad [name] on 08/30/2021 09/01/2021 -------------------------------------------------------------------------------- Pain Assessment Details Patient Name: Date of Service: [name] [name] 08/30/2021 8:00 A M Medical Record Number: 09/01/2021 Patient Account Number: Chad [name] Date of Birth/Sex: Treating RN: 06/25/1966 (55 y.o. [id number] Primary Care Tyrel Lex: PCP,  NO Other Clinician: Referring Malya Cirillo: Treating Nyelle Wolfson/Extender: Chad [name] in Treatment: 4 Active Problems Location of Pain Severity and Description of Pain Patient Has Paino Yes Site Locations Pain Location: Pain Location: Generalized Pain With Dressing Change: No Duration of the Pain. Constant / Intermittento Intermittent Rate the pain. Current Pain Level: 6 Character of Pain Describe the Pain: Aching Pain Management and Medication Current Pain Management: Medication: Yes Rest: Yes Notes reports chronic pain in left hip especially with walking Electronic Signature(s) Signed: 08/30/2021 4:24:09 PM By: [name] RN, BSN Entered By: [name] on 08/30/2021 08:06:31 -------------------------------------------------------------------------------- Patient/Caregiver Education Details Patient Name: Date of Service: Chad [name] [name] 12/14/2022andnbsp8:00 A M Medical Record Number: [id number] Patient Account Number: [id number] Date of Birth/Gender: Treating RN: 1966/08/30 (55 y.o. Chad [name] Primary Care Physician: PCP, NO Other Clinician: Referring Physician: Treating Physician/Extender: Chad [name] in Treatment: 4 Education Assessment Education Provided To: Patient Education Topics Provided Venous: Methods: Explain/Verbal Responses: Reinforcements needed, State content correctly Wound/Skin Impairment: Methods:  Explain/Verbal Responses: Reinforcements needed, State content correctly Electronic Signature(s) Signed: 08/30/2021 4:24:09 PM By: [name] RN, BSN Entered By: [name] on 08/30/2021 08:19:50 -------------------------------------------------------------------------------- Wound Assessment Details Patient Name: Date of Service: Chad [name] [name] 08/30/2021 8:00 A M Medical Record Number: [id number] Patient Account Number: [id number] Date of Birth/Sex: Treating RN: Jan 18, 1966 (55 y.o. Chad [name] Primary Care Adal Sereno: PCP, NO Other Clinician: Referring Jesslyn Viglione: Treating Kyrollos Cordell/Extender: Chad [name] in Treatment: 4 Wound Status Wound Number: 12 Primary Etiology: Lymphedema Wound Location: Right, Distal, Lateral Lower Leg Wound Status: Healed - Epithelialized Wounding Event: Gradually Appeared Comorbid History: Hypertension, Peripheral Venous Disease, Osteoarthritis Date Acquired: 08/16/2021 Weeks Of Treatment: 2 Clustered Wound: No Photos Wound Measurements Length: (cm) Width: (cm) Depth: (cm) Area: (cm) Volume: (cm) 0 % Reduction in Area: 100% 0 % Reduction in Volume: 100% 0 Epithelialization: Large (67-100%) 0 Tunneling: No 0 Undermining: No Wound Description Classification: Full Thickness Without Exposed Support Structures Wound Margin: Distinct, outline attached Exudate Amount: None Present Foul Odor After Cleansing: No Slough/Fibrino No Wound Bed Granulation Amount: None Present (0%) Exposed Structure Necrotic Amount: None Present (0%) Fascia Exposed: No Fat Layer (Subcutaneous Tissue) Exposed: No Tendon Exposed: No Muscle Exposed: No Joint Exposed: No Bone Exposed: No Electronic Signature(s) Signed: 08/31/2021 8:37:28 AM By: Haywood Pao CHT EMT BS , , Signed: 08/31/2021 5:32:07 PM By: [name] RN, BSN Previous Signature: 08/30/2021 4:24:09 PM Version By: [name] RN, BSN Entered By: Haywood Pao on 08/31/2021 08:37:28 -------------------------------------------------------------------------------- Vitals Details Patient Name: Date of Service: Chad [name] [name] 08/30/2021 8:00 A M Medical Record Number: [id number] Patient Account Number: [id number] Date of Birth/Sex: Treating RN: [birth year] (55 y.o. Chad [name] Primary Care Reilly Blades: PCP, NO Other Clinician: Referring Roshawna Colclasure: Treating Teiara Baria/Extender: Chad [name] in Treatment: 4 Vital Signs Time Taken: 08:05 Temperature (F): 98.3 Height (in): 76 Pulse (bpm): 82 Source: Stated Respiratory Rate (breaths/min): 20 Weight (lbs): 500 Blood Pressure (mmHg): 176/96 Source: Stated Reference Range: 80 - 120 mg / dl Body Mass Index (BMI): 60.9 Electronic Signature(s) Signed: 08/30/2021 4:24:09 PM By: [name] RN, BSN Entered By: [name] on 08/30/2021 08:05:40
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
## 2021-09-25 ENCOUNTER — Encounter (HOSPITAL_BASED_OUTPATIENT_CLINIC_OR_DEPARTMENT_OTHER): Payer: Federal, State, Local not specified - PPO | Admitting: Internal Medicine

## 2021-11-22 NOTE — Progress Notes (Addendum)
Chad [name], Chad [name] (Chad [name]) Visit Report for [name] Allergy List Details Patient Name: Date of Service: Chad [name] [name] 9:00 Chad [name] Record Number: Chad [name] Patient Account Number: [id number] Date of Birth/Sex: Treating RN: [name][birth year] (56 y.o. [name]) Primary Care Chad [name]: PCP, NO Other Clinician: Referring Chad [name]: Treating Chad [name]/Extender: Chad [name] in Treatment: 0 Allergies Active Allergies No Known Drug Allergies Allergy Notes Electronic Signature(s) Signed: 11/23/2021 5:23:31 PM By: Lorrin Jackson Previous Signature: 11/22/2021 5:10:08 PM Version By: Donavan Burnet CHT EMT BS , , Entered By: Lorrin Jackson on 11/23/2021 09:33:36 -------------------------------------------------------------------------------- Arrival Information Details Patient Name: Date of Service: Chad [name] [name] 9:00 A [name] Medical Record Number: Chad [name] Patient Account Number: [id number] Date of Birth/Sex: Treating RN: [name][birth year] (56 y.o. Marcheta [name] Primary Care Chad [name]: PCP, NO Other Clinician: Referring Chad [name]: Treating Chad [name]/Extender: Chad [name] in Treatment: 0 Visit Information Patient Arrived: Ambulatory Arrival Time: 09:27 Transfer Assistance: None Patient Identification Verified: Yes Secondary Verification Process Completed: Yes Patient Requires Transmission-Based Precautions: No Patient Has Alerts: Yes Patient Alerts: ABI non compressible bil History Since Last Visit Added or deleted any medications: No Any new allergies or adverse reactions: No Had a fall or experienced change in activities of daily living that may affect risk of falls: No Signs or symptoms of abuse/neglect since last visito No Hospitalized since last visit: No Implantable device outside of the clinic excluding cellular tissue based products placed in the [name] since last visit: No Electronic Signature(s) Signed: 11/24/2021 1:38:56 PM By: Chad Hurst  RN, BSN Entered By: Chad [name] on 11/23/2021 10:06:02 -------------------------------------------------------------------------------- Clinic Level of Care Assessment Details Patient Name: Date of Service: Chad [name] [facility] [name] [name] 9:00 Chad [name] Record Number: Chad [name] Patient Account Number: [id number] Date of Birth/Sex: Treating RN: Chad [name] (56 y.o. Chad [name] Primary Care Kinlie Janice: PCP, NO Other Clinician: Referring Chad [name]: Treating Chad [name]/Extender: Chad [name] in Treatment: 0 Clinic Level of Care Assessment Items TOOL 1 Quantity Score X- 1 0 Use when EandM and Procedure is performed on INITIAL visit ASSESSMENTS - Nursing Assessment / Reassessment X- 1 20 General Physical Exam (combine w/ comprehensive assessment (listed just below) when performed on new pt. evals) X- 1 25 Comprehensive Assessment (HX, ROS, Risk Assessments, Wounds Hx, etc.) ASSESSMENTS - Wound and Skin Assessment / Reassessment []  - 0 Dermatologic / Skin Assessment (not related to wound area) ASSESSMENTS - Ostomy and/or Continence Assessment and Care []  - 0 Incontinence Assessment and Management []  - 0 Ostomy Care Assessment and Management (repouching, etc.) PROCESS - Coordination of Care X - Simple Patient / Family Education for ongoing care 1 15 []  - 0 Complex (extensive) Patient / Family Education for ongoing care X- 1 10 Staff obtains Programmer, systems, Records, T Results / Process Orders est []  - 0 Staff telephones HHA, Nursing Homes / Clarify orders / etc []  - 0 Routine Transfer to another Facility (non-emergent condition) []  - 0 Routine [name] Admission (non-emergent condition) X- 1 15 New Admissions / Biomedical engineer / Ordering NPWT Apligraf, etc. , []  - 0 Emergency [name] Admission (emergent condition) PROCESS - Special Needs []  - 0 Pediatric / Minor Patient Management []  - 0 Isolation Patient Management []  - 0 Hearing / Language / Visual special  needs []  - 0 Assessment of Community assistance (transportation, D/C planning, etc.) []  - 0 Additional assistance / Altered mentation []  - 0 Support Surface(s) Assessment (bed, cushion, seat, etc.) INTERVENTIONS - Miscellaneous []  - 0 External ear exam []  - 0  Patient Transfer (multiple staff / Civil Service fast streamer / Similar devices) []  - 0 Simple Staple / Suture removal (25 or less) []  - 0 Complex Staple / Suture removal (26 or more) []  - 0 Hypo/Hyperglycemic Management (do not check if billed separately) X- 1 15 Ankle / Brachial Index (ABI) - do not check if billed separately Has the patient been seen at the [name] within the last three years: Yes Total Score: 100 Level Of Care: New/Established - Level 3 Electronic Signature(s) Signed: 11/24/2021 1:38:56 PM By: Chad Hurst RN, BSN Signed: 11/24/2021 1:38:56 PM By: Chad Hurst RN, BSN Entered By: Chad [name] on 11/23/2021 12:09:09 -------------------------------------------------------------------------------- Compression Therapy Details Patient Name: Date of Service: Chad [name] 9:00 [place] Record Number: Chad [name] Patient Account Number: [id number] Date of Birth/Sex: Treating RN: [birth year][name] (56 y.o. Chad [name] Primary Care Chad [name]: PCP, NO Other Clinician: Referring Chad [name]: Treating Chad [name]/Extender: Chad [name] in Treatment: 0 Compression Therapy Performed for Wound Assessment: Wound #13 Left,Circumferential Lower Leg Performed By: Clinician Chad Hurst, RN Compression Type: Four Layer Post Procedure Diagnosis Same as Pre-procedure Electronic Signature(s) Signed: 11/24/2021 1:38:56 PM By: Chad Hurst RN, BSN Entered By: Chad [name] on 11/23/2021 10:08:27 -------------------------------------------------------------------------------- Compression Therapy Details Patient Name: Date of Service: Chad [name] Medical Record Number:  Chad [name] Patient Account Number: [id number] Date of Birth/Sex: Treating RN: [name]-[birth year] (56 y.o. Chad [name] Primary Care Chad [name]: PCP, NO Other Clinician: Referring Chad [name]: Treating Chad [name]/Extender: Chad [name] in Treatment: 0 Compression Therapy Performed for Wound Assessment: Wound #14 Right,Posterior Lower Leg Performed By: Clinician Chad Hurst, RN Compression Type: Four Layer Post Procedure Diagnosis Same as Pre-procedure Electronic Signature(s) Signed: 11/24/2021 1:38:56 PM By: Chad Hurst RN, BSN Entered By: Chad [name] on 11/23/2021 10:08:27 -------------------------------------------------------------------------------- Encounter Discharge Information Details Patient Name: Date of Service: Chad [name] [name] 9:00 [place] Record Number: Chad [name] Patient Account Number: [id number] Date of Birth/Sex: Treating RN: [birth year]-[name] (56 y.o. Chad [name] Primary Care Hamsa Laurich: PCP, NO Other Clinician: Referring Kynisha Memon: Treating Belky Mundo/Extender: Chad [name] in Treatment: 0 Encounter Discharge Information Items Post Procedure Vitals Discharge Condition: Stable Temperature (F): 98.3 Ambulatory Status: Ambulatory Pulse (bpm): 112 Discharge Destination: Home Respiratory Rate (breaths/min): 22 Transportation: Private Auto Blood Pressure (mmHg): 188/100 Accompanied By: alone Schedule Follow-up Appointment: Yes Clinical Summary of Care: Patient Declined Electronic Signature(s) Signed: 11/24/2021 1:38:56 PM By: Chad Hurst RN, BSN Entered By: Chad [name] on 11/23/2021 12:10:19 -------------------------------------------------------------------------------- Lower Extremity Assessment Details Patient Name: Date of Service: Chad [name] [name] 9:00 [place] Record Number: Chad [name] Patient Account Number: [id number] Date of Birth/Sex: Treating RN: [name]-[birth year] (56 y.o. Marcheta [name] Primary Care  Yashvi Jasinski: PCP, NO Other Clinician: Referring Kasyn Stouffer: Treating Quinnlyn Hearns/Extender: Chad [name] in Treatment: 0 Edema Assessment Assessed: [Left: Yes] [Right: Yes] Edema: [Left: Yes] [Right: Yes] Calf Left: Right: Point of Measurement: 40 cm From Medial Instep 55 cm 56 cm Ankle Left: Right: Point of Measurement: 10 cm From Medial Instep 28.5 cm 29 cm Knee To Floor Left: Right: From Medial Instep 49 cm 49 cm Vascular Assessment Pulses: Dorsalis Pedis Palpable: [Left:Yes] [Right:Yes] Notes Bilateral ABI's = Non compressible Electronic Signature(s) Signed: 11/23/2021 5:23:31 PM By: Lorrin Jackson Entered By: Lorrin Jackson on 11/23/2021 09:45:28 -------------------------------------------------------------------------------- Multi Wound Chart Details Patient Name: Date of Service: Chad [name] Medical Record Number: Chad [name] Patient Account Number: [id number] Date of Birth/Sex: Treating RN: [name]-[birth year] (  56 y.o. [name]) Primary Care Zackary Mckeone: PCP, NO Other Clinician: Referring Proctor Carriker: Treating Coleby Yett/Extender: Chad [name] in Treatment: 0 Vital Signs Height(in): 76 Pulse(bpm): 112 Weight(lbs): 400 Blood Pressure(mmHg): 188/100 Body Mass Index(BMI): 48.7 Temperature(F): 98.3 Respiratory Rate(breaths/min): 22 Photos: [13:No Photos Left, Circumferential Lower Leg] [14:No Photos Right, Posterior Lower Leg] [N/A:N/A N/A] Wound Location: [13:Gradually Appeared] [14:Gradually Appeared] [N/A:N/A] Wounding Event: [13:Venous Leg Ulcer] [14:Venous Leg Ulcer] [N/A:N/A] Primary Etiology: [13:Hypertension, Peripheral Venous] [14:Hypertension, Peripheral Venous] [N/A:N/A] Comorbid History: [13:Disease, Osteoarthritis 10/30/2021] [14:Disease, Osteoarthritis 10/30/2021] [N/A:N/A] Date Acquired: [13:0] [14:0] [N/A:N/A] Weeks of Treatment: [13:Open] [14:Open] [N/A:N/A] Wound Status: [13:No] [14:No] [N/A:N/A] Wound Recurrence: [13:Yes] [14:Yes]  [N/A:N/A] Clustered Wound: [13:8x51x0.1] [14:12x5x0.1] [N/A:N/A] Measurements L x W x D (cm) [13:320.442] [id number] [N/A:N/A] A (cm) : rea [13:32.044] [14:4.712] [N/A:N/A] Volume (cm) : [13:0.00%] [14:N/A] [N/A:N/A] % Reduction in A [13:rea: 0.00%] [14:N/A] [N/A:N/A] % Reduction in Volume: [13:Full Thickness Without Exposed] [14:Full Thickness Without Exposed] [N/A:N/A] Classification: [13:Support Structures Large] [14:Support Structures Medium] [N/A:N/A] Exudate A mount: [13:Serous] [14:Serous] [N/A:N/A] Exudate Type: [13:amber] [14:amber] [N/A:N/A] Exudate Color: [13:Distinct, outline attached] [14:Distinct, outline attached] [N/A:N/A] Wound Margin: [13:Large (67-100%)] [14:Large (67-100%)] [N/A:N/A] Granulation A mount: [13:Red] [14:Red] [N/A:N/A] Granulation Quality: [13:None Present (0%)] [14:None Present (0%)] [N/A:N/A] Necrotic A mount: [13:Fat Layer (Subcutaneous Tissue): Yes Fat Layer (Subcutaneous Tissue): Yes N/A] Exposed Structures: [13:Fascia: No Tendon: No Muscle: No Joint: No Bone: No None] [14:Fascia: No Tendon: No Muscle: No Joint: No Bone: No None] [N/A:N/A] Epithelialization: [13:Debridement - Selective/Open Wound Debridement - Selective/Open Wound N/A] Debridement: Pre-procedure Verification/Time Out 10:03 [14:10:03] [N/A:N/A] Taken: [13:Slough] [14:Slough] [N/A:N/A] Tissue Debrided: [13:Non-Viable Tissue] [14:Non-Viable Tissue] [N/A:N/A] Level: [13:9] [14:4] [N/A:N/A] Debridement A (sq cm): [13:rea Curette] [14:Curette] [N/A:N/A] Instrument: [13:Minimum] [14:Minimum] [N/A:N/A] Bleeding: [13:Pressure] [14:Pressure] [N/A:N/A] Hemostasis A chieved: [13:0] [14:0] [N/A:N/A] Procedural Pain: [13:0] [14:0] [N/A:N/A] Post Procedural Pain: [13:Procedure was tolerated well] [14:Procedure was tolerated well] [N/A:N/A] Debridement Treatment Response: [13:8x51x0.1] [14:12x5x0.1] [N/A:N/A] Post Debridement Measurements L x W x D (cm) [13:32.044] [14:4.712] [N/A:N/A] Post  Debridement Volume: (cm) [13:Compression Therapy] [14:Compression Therapy] [N/A:N/A] Procedures Performed: [13:Debridement] [14:Debridement] Treatment Notes Wound #13 (Lower Leg) Wound Laterality: Left, Circumferential Cleanser Soap and Water Discharge Instruction: May shower and wash wound with dial antibacterial soap and water prior to dressing change. Wound Cleanser Discharge Instruction: Cleanse the wound with wound cleanser prior to applying a clean dressing using gauze sponges, not tissue or cotton balls. Peri-Wound Care Sween Lotion (Moisturizing lotion) Discharge Instruction: Apply moisturizing lotion as directed Topical Primary Dressing KerraCel Ag Gelling Fiber Dressing, 4x5 in (silver alginate) Discharge Instruction: Apply silver alginate to wound bed as instructed Secondary Dressing ABD Pad, 8x10 Discharge Instruction: Apply over primary dressing as directed. Zetuvit Plus 4x8 in Discharge Instruction: Apply over primary dressing as directed. Secured With Compression Wrap FourPress (4 layer compression wrap) Discharge Instruction: Apply four layer compression as directed. May also use Miliken CoFlex 2 layer compression system as alternative. Compression Stockings Add-Ons Wound #14 (Lower Leg) Wound Laterality: Right, Posterior Cleanser Soap and Water Discharge Instruction: May shower and wash wound with dial antibacterial soap and water prior to dressing change. Wound Cleanser Discharge Instruction: Cleanse the wound with wound cleanser prior to applying a clean dressing using gauze sponges, not tissue or cotton balls. Peri-Wound Care Sween Lotion (Moisturizing lotion) Discharge Instruction: Apply moisturizing lotion as directed Topical Primary Dressing KerraCel Ag Gelling Fiber Dressing, 4x5 in (silver alginate) Discharge Instruction: Apply silver alginate to wound bed as instructed Secondary Dressing ABD Pad, 8x10 Discharge Instruction: Apply over primary  dressing as directed.  Zetuvit Plus 4x8 in Discharge Instruction: Apply over primary dressing as directed. Secured With Compression Wrap FourPress (4 layer compression wrap) Discharge Instruction: Apply four layer compression as directed. May also use Miliken CoFlex 2 layer compression system as alternative. Compression Stockings Add-Ons Electronic Signature(s) Signed: 11/23/2021 5:55:10 PM By: Fredirick Maudlin MD FACS Entered By: Fredirick Maudlin on 11/23/2021 17:55:10 -------------------------------------------------------------------------------- Multi-Disciplinary Care Plan Details Patient Name: Date of Service: Chad [name] [name] 9:00 A [name] Medical Record Number: [id number] Patient Account Number: [id number] Date of Birth/Sex: Treating RN: [birth year][name] (56 y.o. Chad [name] Primary Care Elisabetta Mishra: PCP, NO Other Clinician: Referring Jaelan Rasheed: Treating Anderson Middlebrooks/Extender: Chad [name] in Treatment: 0 Multidisciplinary Care Plan reviewed with physician Active Inactive Venous Leg Ulcer Nursing Diagnoses: Knowledge deficit related to disease process and management Goals: Patient will maintain optimal edema control Date Initiated: [name] Target Resolution Date: 12/22/2021 Goal Status: Active Interventions: Assess peripheral edema status every visit. Compression as ordered Provide education on venous insufficiency Notes: Wound/Skin Impairment Nursing Diagnoses: Impaired tissue integrity Knowledge deficit related to ulceration/compromised skin integrity Goals: Patient/caregiver will verbalize understanding of skin care regimen Date Initiated: [name] Target Resolution Date: 12/22/2021 Goal Status: Active Interventions: Assess patient/caregiver ability to obtain necessary supplies Assess patient/caregiver ability to perform ulcer/skin care regimen upon admission and as needed Assess ulceration(s) every visit Provide education on ulcer and skin  care Notes: Electronic Signature(s) Signed: 11/24/2021 1:38:56 PM By: Chad Hurst RN, BSN Entered By: Chad [name] on 11/23/2021 10:02:23 -------------------------------------------------------------------------------- Patient/Caregiver Education Details Patient Name: Date of Service: Chad [name] 3/9/2023andnbsp9:00 [place] Record Number: [id number] Patient Account Number: [id number] Date of Birth/Gender: Treating RN: [birth year][name] (56 y.o. Chad [name] Primary Care Physician: PCP, NO Other Clinician: Referring Physician: Treating Physician/Extender: Chad [name] in Treatment: 0 Education Assessment Education Provided To: Patient Education Topics Provided Wound/Skin Impairment: Methods: Explain/Verbal Responses: State content correctly Electronic Signature(s) Signed: 11/24/2021 1:38:56 PM By: Chad Hurst RN, BSN Entered By: Chad [name] on 11/23/2021 10:02:55 -------------------------------------------------------------------------------- Wound Assessment Details Patient Name: Date of Service: Chad [name] Medical Record Number: [id number] Patient Account Number: [id number] Date of Birth/Sex: Treating RN: [birth year] (56 y.o. Marcheta [name] Primary Care Karnell Vanderloop: PCP, NO Other Clinician: Referring Jerrika Ledlow: Treating Tyjae Shvartsman/Extender: Chad [name] in Treatment: 0 Wound Status Wound Number: 13 Primary Etiology: Venous Leg Ulcer Wound Location: Left, Circumferential Lower Leg Wound Status: Open Wounding Event: Gradually Appeared Comorbid History: Hypertension, Peripheral Venous Disease, Osteoarthritis Date Acquired: 10/30/2021 Weeks Of Treatment: 0 Clustered Wound: Yes Wound Measurements Length: (cm) 8 Width: (cm) 51 Depth: (cm) 0.1 Area: (cm) 320.442 Volume: (cm) 32.044 % Reduction in Area: 0% % Reduction in Volume: 0% Epithelialization: None Wound Description Classification: Full Thickness  Without Exposed Support Structures Wound Margin: Distinct, outline attached Exudate Amount: Large Exudate Type: Serous Exudate Color: amber Foul Odor After Cleansing: No Slough/Fibrino No Wound Bed Granulation Amount: Large (67-100%) Exposed Structure Granulation Quality: Red Fascia Exposed: No Necrotic Amount: None Present (0%) Fat Layer (Subcutaneous Tissue) Exposed: Yes Tendon Exposed: No Muscle Exposed: No Joint Exposed: No Bone Exposed: No Treatment Notes Wound #13 (Lower Leg) Wound Laterality: Left, Circumferential Cleanser Soap and Water Discharge Instruction: May shower and wash wound with dial antibacterial soap and water prior to dressing change. Wound Cleanser Discharge Instruction: Cleanse the wound with wound cleanser prior to applying a clean dressing using gauze sponges, not tissue or cotton balls. Peri-Wound Care Sween Lotion (Moisturizing lotion) Discharge Instruction: Apply moisturizing lotion  as directed Topical Primary Dressing KerraCel Ag Gelling Fiber Dressing, 4x5 in (silver alginate) Discharge Instruction: Apply silver alginate to wound bed as instructed Secondary Dressing ABD Pad, 8x10 Discharge Instruction: Apply over primary dressing as directed. Zetuvit Plus 4x8 in Discharge Instruction: Apply over primary dressing as directed. Secured With Compression Wrap FourPress (4 layer compression wrap) Discharge Instruction: Apply four layer compression as directed. May also use Miliken CoFlex 2 layer compression system as alternative. Compression Stockings Add-Ons Electronic Signature(s) Signed: 11/23/2021 5:23:31 PM By: Lorrin Jackson Entered By: Lorrin Jackson on 11/23/2021 09:53:18 -------------------------------------------------------------------------------- Wound Assessment Details Patient Name: Date of Service: Chad [name] [name] 9:00 A [name] Medical Record Number: Chad [name] Patient Account Number: [id number] Date of  Birth/Sex: Treating RN: [name]-[birth year] (56 y.o. Marcheta [name] Primary Care Zinnia Tindall: PCP, NO Other Clinician: Referring Adylin Hankey: Treating Ismerai Bin/Extender: Chad [name] in Treatment: 0 Wound Status Wound Number: 14 Primary Etiology: Venous Leg Ulcer Wound Location: Right, Posterior Lower Leg Wound Status: Open Wounding Event: Gradually Appeared Comorbid History: Hypertension, Peripheral Venous Disease, Osteoarthritis Date Acquired: 10/30/2021 Weeks Of Treatment: 0 Clustered Wound: No Wound Measurements Length: (cm) 12 Width: (cm) 5 Depth: (cm) 0.1 Area: (cm) 47.124 Volume: (cm) 4.712 % Reduction in Area: % Reduction in Volume: Epithelialization: None Tunneling: No Undermining: No Wound Description Classification: Full Thickness Without Exposed Support Structures Wound Margin: Distinct, outline attached Exudate Amount: Medium Exudate Type: Serous Exudate Color: amber Foul Odor After Cleansing: No Slough/Fibrino No Wound Bed Granulation Amount: Large (67-100%) Exposed Structure Granulation Quality: Red Fascia Exposed: No Necrotic Amount: None Present (0%) Fat Layer (Subcutaneous Tissue) Exposed: Yes Tendon Exposed: No Muscle Exposed: No Joint Exposed: No Bone Exposed: No Electronic Signature(s) Signed: 11/23/2021 5:23:31 PM By: Lorrin Jackson Entered By: Lorrin Jackson on 11/23/2021 09:49:56 -------------------------------------------------------------------------------- Vitals Details Patient Name: Date of Service: Chad [name] Medical Record Number: Chad [name] Patient Account Number: [id number] Date of Birth/Sex: Treating RN: [birth year] (56 y.o. Marcheta [name] Primary Care Gayl Ivanoff: PCP, NO Other Clinician: Referring Zuri Bradway: Treating Min Tunnell/Extender: Chad [name] in Treatment: 0 Vital Signs Time Taken: 09:27 Temperature (F): 98.3 Height (in): 76 Pulse (bpm): 112 Source: Stated Respiratory Rate  (breaths/min): 22 Weight (lbs): 400 Blood Pressure (mmHg): 188/100 Body Mass Index (BMI): 48.7 Reference Range: 80 - 120 mg / dl Notes Patient states he hasn't taken BP meds today Electronic Signature(s) Signed: 11/23/2021 5:23:31 PM By: Lorrin Jackson Entered By: Lorrin Jackson on 11/23/2021 09:32:55

## 2021-11-23 ENCOUNTER — Encounter (HOSPITAL_BASED_OUTPATIENT_CLINIC_OR_DEPARTMENT_OTHER): Payer: Medicare HMO | Attending: Internal Medicine | Admitting: General Surgery

## 2021-11-23 ENCOUNTER — Other Ambulatory Visit: Payer: Self-pay

## 2021-11-23 DIAGNOSIS — I872 Venous insufficiency (chronic) (peripheral): Secondary | ICD-10-CM | POA: Diagnosis not present

## 2021-11-23 DIAGNOSIS — L97222 Non-pressure chronic ulcer of left calf with fat layer exposed: Secondary | ICD-10-CM | POA: Insufficient documentation

## 2021-11-23 DIAGNOSIS — Z09 Encounter for follow-up examination after completed treatment for conditions other than malignant neoplasm: Secondary | ICD-10-CM | POA: Insufficient documentation

## 2021-11-23 DIAGNOSIS — L97212 Non-pressure chronic ulcer of right calf with fat layer exposed: Secondary | ICD-10-CM | POA: Insufficient documentation

## 2021-11-23 DIAGNOSIS — I10 Essential (primary) hypertension: Secondary | ICD-10-CM | POA: Diagnosis not present

## 2021-11-23 DIAGNOSIS — I890 Lymphedema, not elsewhere classified: Secondary | ICD-10-CM | POA: Diagnosis present

## 2021-11-23 NOTE — Progress Notes (Signed)
Chad [name], HUMPHRES ([id number]) ?Visit Report for 11/23/2021 ?Abuse Risk Screen Details ?Patient Name: Date of Service: ?Chad [name] [facility] 11/23/2021 9:00 A M ?Medical Record Number: [id number] ?Patient Account Number: [id number] ?Date of Birth/Sex: Treating RN: ?24-Jan-1966 (56 y.o. [name] ?Primary Care Philena Obey: PCP, NO Other Clinician: ?Referring Hezzie Karim: ?Treating Analei Whinery/Extender: Fredirick Maudlin ?[name]: 0 ?Abuse Risk Screen Items ?Answer ?ABUSE RISK SCREEN: ?Has anyone close to you tried to hurt or harm you recentlyo No ?Do you feel uncomfortable with anyone in your familyo No ?Has anyone forced you do things that you didnt want to doo No ?Electronic Signature(s) ?Signed: 11/23/2021 5:23:31 PM By: Lorrin Jackson ?Entered By: Lorrin Jackson on 11/23/2021 09:33:45 ?-------------------------------------------------------------------------------- ?Activities of Daily Living Details ?Patient Name: Date of Service: ?Chad [name] [facility] 11/23/2021 9:00 A M ?Medical Record Number: [id number] ?Patient Account Number: [id number] ?Date of Birth/Sex: Treating RN: ?[birth year] (56 y.o. [name] ?Primary Care Rayshawn Visconti: PCP, NO Other Clinician: ?Referring Keaton Beichner: ?Treating Leronda Lewers/Extender: Fredirick Maudlin ?[name]: 0 ?Activities of Daily Living Items ?Answer ?Activities of Daily Living (Please select one for each item) ?Rendon ?T Medications ?ake Completely Able ?Use T elephone Completely Able ?Care for Appearance Completely Able ?Use T oilet Completely Able ?Bath / Shower Completely Able ?Dress Self Completely Able ?Feed Self Completely Able ?Walk Completely Able ?Get In / Out Bed Completely Able ?Housework Completely Able ?Prepare Meals Completely Able ?Handle Money Completely Able ?Shop for Self Completely Able ?Electronic Signature(s) ?Signed: 11/23/2021 5:23:31 PM By: Lorrin Jackson ?Entered By: Lorrin Jackson on 11/23/2021  09:34:35 ?-------------------------------------------------------------------------------- ?Education Screening Details ?Patient Name: ?Date of Service: ?Chad [name] [facility] 11/23/2021 9:00 A M ?Medical Record Number: [id number] ?Patient Account Number: [id number] ?Date of Birth/Sex: ?Treating RN: ?[birth year] (56 y.o. [name] ?Primary Care Erricka Falkner: PCP, NO ?Other Clinician: ?Referring Jordan Pardini: ?Treating Mattingly Fountaine/Extender: Fredirick Maudlin ?[name]: 0 ?Primary Learner Assessed: Patient ?Learning Preferences/Education Level/Primary Language ?Learning Preference: Explanation, Demonstration, Printed Material ?Highest Education Level: High School ?Preferred Language: English ?Cognitive Barrier ?Language Barrier: No ?Translator Needed: No ?Memory Deficit: No ?Emotional Barrier: No ?Cultural/Religious Beliefs Affecting Medical Care: No ?Physical Barrier ?Impaired Vision: No ?Impaired Hearing: No ?Decreased Hand dexterity: No ?Knowledge/Comprehension ?Knowledge Level: High ?Comprehension Level: High ?Ability to understand written instructions: High ?Ability to understand verbal instructions: High ?Motivation ?Anxiety Level: Calm ?Cooperation: Cooperative ?Education Importance: Acknowledges Need ?Interest in Health Problems: Asks Questions ?Perception: Coherent ?Willingness to Engage in Self-Management High ?Activities: ?Readiness to Engage in Self-Management High ?Activities: ?Electronic Signature(s) ?Signed: 11/23/2021 5:23:31 PM By: Lorrin Jackson ?Entered By: Lorrin Jackson on 11/23/2021 09:35:10 ?-------------------------------------------------------------------------------- ?Fall Risk Assessment Details ?Patient Name: ?Date of Service: ?Chad [name] [facility] 11/23/2021 9:00 A M ?Medical Record Number: [id number] ?Patient Account Number: [id number] ?Date of Birth/Sex: ?Treating RN: ?May 19, 1966 (55 y.o. [name] ?Primary Care Tiffany Calmes: PCP, NO ?Other Clinician: ?Referring Sean Macwilliams: ?Treating  Jaysun Wessels/Extender: Fredirick Maudlin ?[name]: 0 ?Fall Risk Assessment Items ?Have you had 2 or more falls in the last 12 monthso 0 No ?Have you had any fall that resulted in injury in the last 12 monthso 0 No ?FALLS RISK SCREEN ?History of falling - immediate or within 3 months 0 No ?Secondary diagnosis (Do you have 2 or more medical diagnoseso) 15 Yes ?Ambulatory aid ?None/bed rest/wheelchair/nurse 0 Yes ?Crutches/cane/walker 0 No ?Furniture 0 No ?Intravenous therapy Access/Saline/Heparin Lock 0 No ?Gait/Transferring ?Normal/ bed rest/ wheelchair 0 Yes ?Weak (short steps with or without shuffle, stooped but able to  lift head while walking, may seek 0 No ?support from furniture) ?Impaired (short steps with shuffle, may have difficulty arising from chair, head down, impaired 0 No ?balance) ?Mental Status ?Oriented to own ability 0 Yes ?Electronic Signature(s) ?Signed: 11/23/2021 5:23:31 PM By: Lorrin Jackson ?Entered By: Lorrin Jackson on 11/23/2021 09:35:29 ?-------------------------------------------------------------------------------- ?Foot Assessment Details ?Patient Name: ?Date of Service: ?Chad [name] [facility] 11/23/2021 9:00 A M ?Medical Record Number: [id number] ?Patient Account Number: [id number] ?Date of Birth/Sex: ?Treating RN: ?07/20/1966 (56 y.o. [name] ?Primary Care Zikeria Keough: PCP, NO ?Other Clinician: ?Referring Twala Collings: ?Treating Trachelle Low/Extender: Fredirick Maudlin ?[name]: 0 ?Foot Assessment Items ?Site Locations ?+ = Sensation present, - = Sensation absent, C = Callus, U = Ulcer ?R = Redness, W = Warmth, M = Maceration, PU = Pre-ulcerative lesion ?F = Fissure, S = Swelling, D = Dryness ?Assessment ?Right: Left: ?Other Deformity: No No ?Prior Foot Ulcer: No No ?Prior Amputation: No No ?Charcot Joint: No No ?Ambulatory Status: Ambulatory Without Help ?Gait: Steady ?Electronic Signature(s) ?Signed: 11/23/2021 5:23:31 PM By: Lorrin Jackson ?Entered By: Lorrin Jackson on  11/23/2021 09:36:16 ?-------------------------------------------------------------------------------- ?Nutrition Risk Screening Details ?Patient Name: ?Date of Service: ?Chad [name] [facility] 11/23/2021 9:00 A M ?Medical Record Number: [id number] ?Patient Account Number: [id number] ?Date of Birth/Sex: ?Treating RN: ?Aug 22, 1966 (56 y.o. [name] ?Primary Care Angelica Frandsen: PCP, NO ?Other Clinician: ?Referring Saladin Petrelli: ?Treating Dalaney Needle/Extender: Fredirick Maudlin ?[name]: 0 ?Height (in): 76 ?Weight (lbs): 400 ?Body Mass Index (BMI): 48.7 ?Nutrition Risk Screening Items ?Score Screening ?NUTRITION RISK SCREEN: ?I have an illness or condition that made me change the kind and/or amount of food I eat 0 No ?I eat fewer than two meals per day 0 No ?I eat few fruits and vegetables, or milk products 0 No ?I have three or more drinks of beer, liquor or wine almost every day 0 No ?I have tooth or mouth problems that make it hard for me to eat 0 No ?I don't always have enough money to buy the food I need 0 No ?I eat alone most of the time 0 No ?I take three or more different prescribed or over-the-counter drugs a day 1 Yes ?Without wanting to, I have lost or gained 10 pounds in the last six months 0 No ?I am not always physically able to shop, cook and/or feed myself 0 No ?Nutrition Protocols ?Good Risk Protocol 0 No interventions needed ?Moderate Risk Protocol ?High Risk Proctocol ?Risk Level: Good Risk ?Score: 1 ?Electronic Signature(s) ?Signed: 11/23/2021 5:23:31 PM By: Lorrin Jackson ?Entered By: Lorrin Jackson on 11/23/2021 09:35:38 ?

## 2021-11-24 NOTE — Progress Notes (Signed)
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 wound bed. 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 There was a Selective/Open Wound Non-Viable Tissue Debridement with a total area of 9 sq cm performed by Duanne Guess, MD. With the following instrument(s): Curette to remove Non-Viable tissue/material. Material removed includes [facility]. No specimens were taken. A time out was conducted at 10:03, prior to the start of the procedure. A Minimum amount of bleeding was controlled with Pressure. The procedure was tolerated well with a pain level of 0 throughout and a pain level of 0 following the procedure.  Post Debridement Measurements: 8cm length x 51cm width x 0.1cm depth; 32.044cm^3 volume. Character of Wound/Ulcer Post Debridement is improved. Severity of Tissue Post Debridement is: Fat layer exposed. Post procedure Diagnosis Wound #13: Same as Pre-Procedure Pre-procedure diagnosis of Wound #13 is a Venous Leg Ulcer located on the Left,Circumferential Lower Leg . There was a Four Layer Compression Therapy Procedure by Zandra Abts, RN. Post procedure Diagnosis Wound #13: Same as Pre-Procedure Wound #14 Pre-procedure diagnosis of Wound #14 is a Venous Leg Ulcer located on the Right,Posterior Lower Leg .Severity of Tissue Pre Debridement is: Fat layer exposed. There was a Selective/Open Wound Non-Viable Tissue Debridement with a total area of 4 sq cm performed by Duanne Guess, MD. With the following instrument(s): Curette to remove Non-Viable tissue/material. Material removed includes [facility]. No specimens were taken. A time out was conducted at 10:03, prior to the start of the procedure. A Minimum amount of bleeding was controlled with Pressure. The procedure was tolerated well with a pain level of 0 throughout and a pain level of 0 following the procedure. Post Debridement Measurements: 12cm length x 5cm width x 0.1cm depth; 4.712cm^3 volume. Character of Wound/Ulcer Post Debridement is improved. Severity of Tissue Post Debridement is: Fat layer exposed. Post procedure Diagnosis Wound #14: Same as Pre-Procedure Pre-procedure diagnosis of Wound #14 is a Venous Leg Ulcer located on the Right,Posterior Lower Leg . There was a Four Layer Compression Therapy Procedure by Zandra Abts, RN. Post procedure Diagnosis Wound #14: Same as Pre-Procedure Plan Follow-up Appointments: Return Appointment in 1 week. - Dr. Lady Gary Bathing/ Shower/ Hygiene: May shower with protection but do not get wound dressing(s) wet. - Ok to use Chartered loss adjuster, can purchase at [facility], [facility], or [employer] Edema Control -  Lymphedema / SCD / Other: Lymphedema Pumps. Use Lymphedema pumps on leg(s) 2-3 times a day for 45-60 minutes. If wearing any wraps or hose, do not remove them. Continue exercising as instructed. - Ok to use pumps over compression wraps Elevate legs to the level of the heart or above for 30 minutes daily and/or when sitting, a frequency of: - throughout the day Avoid standing for long periods of time. Exercise regularly WOUND #13: - Lower Leg Wound Laterality: Left, Circumferential Cleanser: Soap and Water 1 x Per Week/ Discharge Instructions: May shower and wash wound with  dial antibacterial soap and water prior to dressing change. Cleanser: Wound Cleanser 1 x Per Week/ Discharge Instructions: Cleanse the wound with wound cleanser prior to applying a clean dressing using gauze sponges, not tissue or cotton balls. Peri-Wound Care: Sween Lotion (Moisturizing lotion) 1 x Per Week/ Discharge Instructions: Apply moisturizing lotion as directed Prim Dressing: KerraCel Ag Gelling Fiber Dressing, 4x5 in (silver alginate) 1 x Per Week/ ary Discharge Instructions: Apply silver alginate to wound bed as instructed Secondary Dressing: ABD Pad, 8x10 1 x Per Week/ Discharge Instructions: Apply over primary dressing as directed. Secondary Dressing: Zetuvit Plus 4x8 in 1 x Per Week/ Discharge Instructions: Apply over primary dressing as directed. Com pression Wrap: FourPress (4 layer compression wrap) 1 x Per Week/ Discharge Instructions: Apply four layer compression as directed. May also use Miliken CoFlex 2 layer compression system as alternative. WOUND #14: - Lower Leg Wound Laterality: Right, Posterior Cleanser: Soap and Water 1 x Per Week/ Discharge Instructions: May shower and wash wound with dial antibacterial soap and water prior to dressing change. Cleanser: Wound Cleanser 1 x Per Week/ Discharge Instructions: Cleanse the wound with wound cleanser prior to applying a clean dressing using gauze  sponges, not tissue or cotton balls. Peri-Wound Care: Sween Lotion (Moisturizing lotion) 1 x Per Week/ Discharge Instructions: Apply moisturizing lotion as directed Prim Dressing: KerraCel Ag Gelling Fiber Dressing, 4x5 in (silver alginate) 1 x Per Week/ ary Discharge Instructions: Apply silver alginate to wound bed as instructed Secondary Dressing: ABD Pad, 8x10 1 x Per Week/ Discharge Instructions: Apply over primary dressing as directed. Secondary Dressing: Zetuvit Plus 4x8 in 1 x Per Week/ Discharge Instructions: Apply over primary dressing as directed. Com pression Wrap: FourPress (4 layer compression wrap) 1 x Per Week/ Discharge Instructions: Apply four layer compression as directed. May also use Miliken CoFlex 2 layer compression system as alternative. 11/23/2021: Readmitted today for new venous stasis ulcers in the setting of noncompliance with compression and lymphedema pumps. He has circumferential venous stasis/lymphedema ulcers on his left lower extremity and right lower extremity calf ulcers. There is fibrinous debris across the surfaces of these wounds. Changes consistent with chronic lymphedema/venous stasis. No concern for infection. The surface of each open area was debrided with a curette. Silver alginate with 4-layer compression. Encouraged to use lymphedema pumps 2-3 times daily for 45 to 60 minutes. Follow-up in 1 week. Electronic Signature(s) Signed: 11/23/2021 6:01:43 PM By: Duanne Guess MD FACS Entered By: Duanne Guess on 11/23/2021 18:01:43 -------------------------------------------------------------------------------- HxROS Details Patient Name: Date of Service: Chad [name] [name] 11/23/2021 9:00 A M Medical Record Number: [id number] Patient Account Number: [id number] Date of Birth/Sex: Treating RN: 06/08/1966 (56 y.o. M) Primary Care Provider: PCP, NO Other Clinician: Referring Provider: Treating Provider/Extender: Sherryl Manges in Treatment:  0 Information Obtained From Patient Constitutional Symptoms (General Health) Medical History: Past Medical History Notes: obestiy Eyes Medical History: Negative for: Cataracts; Glaucoma; Optic Neuritis Ear/Nose/Mouth/Throat Medical History: Negative for: Chronic sinus problems/congestion; Middle ear problems Hematologic/Lymphatic Medical History: Negative for: Anemia; Hemophilia; Human Immunodeficiency Virus; Lymphedema; Sickle Cell Disease Respiratory Medical History: Negative for: Aspiration; Asthma; Chronic Obstructive Pulmonary Disease (COPD); Pneumothorax; Sleep Apnea; Tuberculosis Cardiovascular Medical History: Positive for: Hypertension; Peripheral Venous Disease Negative for: Angina; Arrhythmia; Congestive Heart Failure; Coronary Artery Disease; Deep Vein Thrombosis; Hypotension; Myocardial Infarction; Peripheral Arterial Disease; Phlebitis; Vasculitis Gastrointestinal Medical History: Negative for: Cirrhosis ; Colitis; Crohns; Hepatitis A; Hepatitis B; Hepatitis C Endocrine Medical History: Negative for: Type I Diabetes; Type II Diabetes Genitourinary  Medical History: Negative for: End Stage Renal Disease Immunological Medical History: Negative for: Lupus Erythematosus; Raynauds; Scleroderma Integumentary (Skin) Medical History: Negative for: History of Burn Musculoskeletal Medical History: Positive for: Osteoarthritis - left hip Negative for: Gout; Rheumatoid Arthritis; Osteomyelitis Neurologic Medical History: Negative for: Dementia; Neuropathy; Quadriplegia; Paraplegia; Seizure Disorder Oncologic Medical History: Negative for: Received Chemotherapy; Received Radiation Psychiatric Medical History: Negative for: Anorexia/bulimia; Confinement Anxiety Immunizations Pneumococcal Vaccine: Received Pneumococcal Vaccination: No Implantable Devices None Family and Social History Cancer: Yes - Mother,Father; Never smoker; Marital Status - Married; Alcohol  Use: Rarely; Drug Use: No History; Caffeine Use: Rarely; Financial Concerns: No; Food, Clothing or Shelter Needs: No; Support System Lacking: No; Transportation Concerns: No Electronic Signature(s) Signed: 11/23/2021 9:51:27 AM By: Haywood Pao CHT EMT BS , , Signed: 11/24/2021 8:31:39 AM By: Duanne Guess MD FACS Entered By: Haywood Pao on 11/22/2021 17:11:18 -------------------------------------------------------------------------------- SuperBill Details Patient Name: Date of Service: Chad [name] [facility] 11/23/2021 Medical Record Number: [id number] Patient Account Number: [id number] Date of Birth/Sex: Treating RN: 10-Feb-1966 (56 y.o. M) Primary Care Provider: PCP, NO Other Clinician: Referring Provider: Treating Provider/Extender: Sherryl Manges in Treatment: 0 Diagnosis Coding ICD-10 Codes Code Description I89.0 Lymphedema, not elsewhere classified L97.221 Non-pressure chronic ulcer of left calf limited to breakdown of skin L97.211 Non-pressure chronic ulcer of right calf limited to breakdown of skin Facility Procedures CPT4 Code: 40981191 Description: 99213 - WOUND CARE VISIT-LEV 3 EST PT Modifier: 25 Quantity: 1 CPT4 Code: 47829562 Description: 97597 - DEBRIDE WOUND 1ST 20 SQ CM OR < ICD-10 Diagnosis Description L97.221 Non-pressure chronic ulcer of left calf limited to breakdown of skin L97.211 Non-pressure chronic ulcer of right calf limited to breakdown of skin Modifier: Quantity: 1 Physician Procedures : CPT4 Code Description Modifier [id number] 99214 - WC PHYS LEVEL 4 - EST PT 25 ICD-10 Diagnosis Description L97.221 Non-pressure chronic ulcer of left calf limited to breakdown of skin L97.211 Non-pressure chronic ulcer of right calf limited to breakdown  of skin I89.0 Lymphedema, not elsewhere classified Quantity: 1 : [id number] 97597 - WC PHYS DEBR WO ANESTH 20 SQ CM ICD-10 Diagnosis Description L97.221 Non-pressure chronic ulcer of left calf limited to  breakdown of skin L97.211 Non-pressure chronic ulcer of right calf limited to breakdown of skin Quantity: 1 Electronic Signature(s) Signed: 11/24/2021 8:31:39 AM By: Duanne Guess MD FACS Signed: 11/24/2021 1:38:56 PM By: Zandra Abts RN, BSN Previous Signature: 11/23/2021 6:02:07 PM Version By: Duanne Guess MD FACS Entered By: Zandra Abts on 11/24/2021 07:09:10

## 2021-11-30 ENCOUNTER — Other Ambulatory Visit: Payer: Self-pay

## 2021-11-30 ENCOUNTER — Encounter (HOSPITAL_BASED_OUTPATIENT_CLINIC_OR_DEPARTMENT_OTHER): Payer: Medicare HMO | Admitting: General Surgery

## 2021-11-30 DIAGNOSIS — Z09 Encounter for follow-up examination after completed treatment for conditions other than malignant neoplasm: Secondary | ICD-10-CM | POA: Diagnosis not present
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
## 2021-12-05 NOTE — Progress Notes (Signed)
Chad [name], PROVENCE ([id number]) ?Visit Report for 11/30/2021 ?Arrival Information Details ?Patient Name: Date of Service: ?[name] [facility] 11/30/2021 8:45 A M ?Medical Record Number: [id number] ?Patient Account Number: [id number] ?Date of Birth/Sex: Treating RN: ?1965/12/23 (56 y.o. Male) ?Primary Care Cheryll Keisler: Hillery Aldo Other Clinician: ?Referring Nicholus Chandran: ?Treating Aniceto Kyser/Extender: Duanne Guess ?Hillery Aldo ?[name]: 1 ?Visit Information History Since Last Visit ?Added or deleted any medications: No ?Patient Arrived: Ambulatory ?Any new allergies or adverse reactions: No ?Arrival Time: 08:57 ?Had a fall or experienced change in No ?Accompanied By: self ?activities of daily living that may affect ?Transfer Assistance: None ?risk of falls: ?Patient Identification Verified: Yes ?Signs or symptoms of abuse/neglect since last visito No ?Secondary Verification Process Completed: Yes ?Hospitalized since last visit: No ?Patient Requires Transmission-Based Precautions: No ?Implantable device outside of the clinic excluding No ?Patient Has Alerts: Yes ?cellular tissue based products placed in the center ?Patient Alerts: ABI non compressible bil since last visit: ?Has Dressing in Place as Prescribed: Yes ?Pain Present Now: No ?Electronic Signature(s) ?Signed: 12/05/2021 3:06:47 PM By: Karl Ito ?Entered By: Karl Ito on 11/30/2021 08:57:43 ?-------------------------------------------------------------------------------- ?Compression Therapy Details ?Patient Name: Date of Service: ?[name] [facility] 11/30/2021 8:45 A M ?Medical Record Number: [id number] ?Patient Account Number: [id number] ?Date of Birth/Sex: Treating RN: ?22-Apr-1966 (56 y.o. Male) Zandra Abts ?Primary Care Aubrea Meixner: Hillery Aldo Other Clinician: ?Referring Caitrin Pendergraph: ?Treating Latrece Nitta/Extender: Duanne Guess ?Hillery Aldo ?[name]: 1 ?Compression Therapy Performed for Wound Assessment: Wound #13  Left,Circumferential Lower Leg ?Performed By: Clinician Zandra Abts, RN ?Compression Type: Four Layer ?Post Procedure Diagnosis ?Same as Pre-procedure ?Electronic Signature(s) ?Signed: 11/30/2021 6:25:59 PM By: Zandra Abts RN, BSN ?Entered By: Zandra Abts on 11/30/2021 09:33:36 ?-------------------------------------------------------------------------------- ?Compression Therapy Details ?Patient Name: ?Date of Service: ?[name] [facility] 11/30/2021 8:45 A M ?Medical Record Number: [id number] ?Patient Account Number: [id number] ?Date of Birth/Sex: ?Treating RN: ?09-Dec-1965 (56 y.o. Male) Zandra Abts ?Primary Care Arham Symmonds: Hillery Aldo ?Other Clinician: ?Referring Tavaras Goody: ?Treating Jillyan Plitt/Extender: Duanne Guess ?Hillery Aldo ?[name]: 1 ?Compression Therapy Performed for Wound Assessment: Wound #14 Right,Posterior Lower Leg ?Performed By: Clinician Zandra Abts, RN ?Compression Type: Four Layer ?Post Procedure Diagnosis ?Same as Pre-procedure ?Electronic Signature(s) ?Signed: 11/30/2021 6:25:59 PM By: Zandra Abts RN, BSN ?Entered By: Zandra Abts on 11/30/2021 09:33:36 ?-------------------------------------------------------------------------------- ?Encounter Discharge Information Details ?Patient Name: ?Date of Service: ?[name] [facility] 11/30/2021 8:45 A M ?Medical Record Number: [id number] ?Patient Account Number: [id number] ?Date of Birth/Sex: ?Treating RN: ?[birth year] (56 y.o. Male) Zandra Abts ?Primary Care Juvon Teater: Hillery Aldo ?Other Clinician: ?Referring Clerance Umland: ?Treating Kaile Bixler/Extender: Duanne Guess ?Hillery Aldo ?[name]: 1 ?Encounter Discharge Information Items Post Procedure Vitals ?Discharge Condition: Stable ?Temperature (F): 98.0 ?Ambulatory Status: Ambulatory ?Pulse (bpm): 96 ?Discharge Destination: Home ?Respiratory Rate (breaths/min): 22 ?Transportation: Private Auto ?Blood Pressure (mmHg): 189/100 ?Accompanied By: alone ?Schedule  Follow-up Appointment: Yes ?Clinical Summary of Care: Patient Declined ?Electronic Signature(s) ?Signed: 11/30/2021 6:25:59 PM By: Zandra Abts RN, BSN ?Entered By: Zandra Abts on 11/30/2021 10:00:57 ?-------------------------------------------------------------------------------- ?Lower Extremity Assessment Details ?Patient Name: ?Date of Service: ?[name] [facility] 11/30/2021 8:45 A M ?Medical Record Number: [id number] ?Patient Account Number: [id number] ?Date of Birth/Sex: ?Treating RN: ?12-Aug-1966 (56 y.o. Male) Zandra Abts ?Primary Care Tasheema Perrone: Hillery Aldo ?Other Clinician: ?Referring Terrion Gencarelli: ?Treating Hansel Devan/Extender: Duanne Guess ?Hillery Aldo ?[name]: 1 ?Edema Assessment ?Assessed: [Left: No] [Right: No] ?Edema: [Left: Yes] [Right: Yes] ?Calf ?Left: Right: ?Point of Measurement: 40 cm From Medial Instep 52 cm  52.5 cm ?Ankle ?Left: Right: ?Point of Measurement: 10 cm From Medial Instep 27.5 cm 28 cm ?Vascular Assessment ?Pulses: ?Dorsalis Pedis ?Palpable: [Left:Yes] [Right:Yes] ?Electronic Signature(s) ?Signed: 11/30/2021 6:25:59 PM By: Zandra Abts RN, BSN ?Entered By: Zandra Abts on 11/30/2021 09:25:12 ?-------------------------------------------------------------------------------- ?Multi Wound Chart Details ?Patient Name: ?Date of Service: ?[name] [facility] 11/30/2021 8:45 A M ?Medical Record Number: [id number] ?Patient Account Number: [id number] ?Date of Birth/Sex: ?Treating RN: ?05/25/1966 (56 y.o. Male) ?Primary Care Deadrian Toya: Hillery Aldo ?Other Clinician: ?Referring Christee Mervine: ?Treating Bonnee Zertuche/Extender: Duanne Guess ?Hillery Aldo ?[name]: 1 ?Vital Signs ?Height(in): ?Pulse(bpm): 96 ?Weight(lbs): 400 ?Blood Pressure(mmHg): 189/100 ?Body Mass Index(BMI): ?Temperature(??F): 98.0 ?Respiratory Rate(breaths/min): 22 ?Photos: [N/A:N/A] ?Left, Circumferential Lower Leg Right, Posterior Lower Leg N/A ?Wound Location: ?Gradually Appeared Gradually Appeared  N/A ?Wounding Event: ?Venous Leg Ulcer Venous Leg Ulcer N/A ?Primary Etiology: ?Hypertension, Peripheral Venous Hypertension, Peripheral Venous N/A ?Comorbid History: ?Disease, Osteoarthritis Disease, Osteoarthritis ?10/30/2021 10/30/2021 N/A ?Date Acquired: ?1 1 N/A ?Weeks of Treatment: ?Open Open N/A ?Wound Status: ?No No N/A ?Wound Recurrence: ?Yes Yes N/A ?Clustered Wound: ?5 3 N/A ?Clustered Quantity: ?6x16x0.1 2x2.4x0.1 N/A ?Measurements L x W x D (cm) ?75.398 3.77 N/A ?A (cm?) : ?rea ?7.54 0.377 N/A ?Volume (cm?) : ?76.50% 92.00% N/A ?% Reduction in Area: ?76.50% 92.00% N/A ?% Reduction in Volume: ?Full Thickness Without Exposed Full Thickness Without Exposed N/A ?Classification: ?Support Structures Support Structures ?Large Medium N/A ?Exudate Amount: ?Serous Serous N/A ?Exudate Type: ?Media planner N/A ?Exudate Color: ?Distinct, outline attached Distinct, outline attached N/A ?Wound Margin: ?Large (67-100%) Large (67-100%) N/A ?Granulation Amount: ?Red Red N/A ?Granulation Quality: ?None Present (0%) None Present (0%) N/A ?Necrotic Amount: ?Fat Layer (Subcutaneous Tissue): Yes Fat Layer (Subcutaneous Tissue): Yes N/A ?Exposed Structures: ?Fascia: No ?Fascia: No ?Tendon: No ?Tendon: No ?Muscle: No ?Muscle: No ?Joint: No ?Joint: No ?Bone: No ?Bone: No ?None Small (1-33%) N/A ?Epithelialization: ?Debridement - Selective/Open Wound Debridement - Selective/Open Wound N/A ?Debridement: ?Pre-procedure Verification/Time Out 09:28 09:28 N/A ?Taken: ?[facility] N/A ?Tissue Debrided: ?Non-Viable Tissue Non-Viable Tissue N/A ?Level: ?9 4.8 N/A ?Debridement A (sq cm): ?rea ?Curette Curette N/A ?Instrument: ?Minimum Minimum N/A ?Bleeding: ?Pressure Pressure N/A ?Hemostasis A chieved: ?0 0 N/A ?Procedural Pain: ?0 0 N/A ?Post Procedural Pain: ?Procedure was tolerated well Procedure was tolerated well N/A ?Debridement Treatment Response: ?6x16x0.1 2x2.4x0.1 N/A ?Post Debridement Measurements L x ?W x D (cm) ?7.54 0.377  N/A ?Post Debridement Volume: (cm?) ?Debridement Debridement N/A ?Procedures Performed: ?Treatment Notes ?Electronic Signature(s) ?Signed: 11/30/2021 9:33:06 AM By: Duanne Guess MD FACS ?Entered By: Duanne Guess on 03/16/202

## 2021-12-07 ENCOUNTER — Encounter (HOSPITAL_BASED_OUTPATIENT_CLINIC_OR_DEPARTMENT_OTHER): Payer: Medicare HMO | Admitting: General Surgery

## 2021-12-07 ENCOUNTER — Other Ambulatory Visit: Payer: Self-pay

## 2021-12-07 DIAGNOSIS — Z09 Encounter for follow-up examination after completed treatment for conditions other than malignant neoplasm: Secondary | ICD-10-CM | POA: Diagnosis not present

## 2021-12-07 NOTE — Progress Notes (Signed)
GURVIR, SCHROM ([id number]) ?Visit Report for 12/07/2021 ?Arrival Information Details ?Patient Name: Date of Service: ?[name] [facility] 12/07/2021 9:15 A M ?Medical Record Number: [id number] ?Patient Account Number: [id number] ?Date of Birth/Sex: Treating RN: ?04-Dec-1965 (56 y.o. [name] ?Primary Care Lavada Langsam: PCP, NO Other Clinician: ?Referring Stein Windhorst: ?Treating Aubryana Vittorio/Extender: Duanne Guess ?Hillery Aldo ?[name]: 2 ?Visit Information History Since Last Visit ?Added or deleted any medications: No ?Patient Arrived: Ambulatory ?Any new allergies or adverse reactions: No ?Arrival Time: 09:30 ?Had a fall or experienced change in No ?Accompanied By: self ?activities of daily living that may affect ?Transfer Assistance: None ?risk of falls: ?Patient Identification Verified: Yes ?Signs or symptoms of abuse/neglect since last visito No ?Secondary Verification Process Completed: Yes ?Hospitalized since last visit: No ?Patient Requires Transmission-Based Precautions: No ?Implantable device outside of the clinic excluding No ?Patient Has Alerts: Yes ?cellular tissue based products placed in the center ?Patient Alerts: ABI non compressible bil since last visit: ?Has Dressing in Place as Prescribed: Yes ?Pain Present Now: Yes ?Electronic Signature(s) ?Signed: 12/07/2021 11:16:06 AM By: Karl Ito ?Entered By: Karl Ito on 12/07/2021 09:31:16 ?-------------------------------------------------------------------------------- ?Compression Therapy Details ?Patient Name: Date of Service: ?[name] [facility] 12/07/2021 9:15 A M ?Medical Record Number: [id number] ?Patient Account Number: [id number] ?Date of Birth/Sex: Treating RN: ?29-Apr-1966 (56 y.o. [name] ?Primary Care Maysa Lynn: PCP, NO Other Clinician: ?Referring Lavergne Hiltunen: ?Treating Kaileia Flow/Extender: Duanne Guess ?Hillery Aldo ?[name]: 2 ?Compression Therapy Performed for Wound Assessment: Wound #13  Left,Circumferential Lower Leg ?Performed By: Clinician Zandra Abts, RN ?Compression Type: Four Layer ?Post Procedure Diagnosis ?Same as Pre-procedure ?Electronic Signature(s) ?Signed: 12/07/2021 5:45:07 PM By: Zandra Abts RN, BSN ?Entered By: Zandra Abts on 12/07/2021 10:02:23 ?-------------------------------------------------------------------------------- ?Compression Therapy Details ?Patient Name: ?Date of Service: ?[name] [facility] 12/07/2021 9:15 A M ?Medical Record Number: [id number] ?Patient Account Number: [id number] ?Date of Birth/Sex: ?Treating RN: ?August 26, 1966 (56 y.o. [name] ?Primary Care Josephyne Tarter: PCP, NO ?Other Clinician: ?Referring Deyjah Kindel: ?Treating Arvil Utz/Extender: Duanne Guess ?Hillery Aldo ?[name]: 2 ?Compression Therapy Performed for Wound Assessment: Wound #14 Right,Posterior Lower Leg ?Performed By: Clinician Zandra Abts, RN ?Compression Type: Four Layer ?Post Procedure Diagnosis ?Same as Pre-procedure ?Electronic Signature(s) ?Signed: 12/07/2021 5:45:07 PM By: Zandra Abts RN, BSN ?Entered By: Zandra Abts on 12/07/2021 10:02:23 ?-------------------------------------------------------------------------------- ?Encounter Discharge Information Details ?Patient Name: ?Date of Service: ?[name] [facility] 12/07/2021 9:15 A M ?Medical Record Number: [id number] ?Patient Account Number: [id number] ?Date of Birth/Sex: ?Treating RN: ?08/27/1966 (56 y.o. [name] ?Primary Care Elward Nocera: PCP, NO ?Other Clinician: ?Referring Tilmon Wisehart: ?Treating Alexiya Franqui/Extender: Duanne Guess ?Hillery Aldo ?[name]: 2 ?Encounter Discharge Information Items Post Procedure Vitals ?Discharge Condition: Stable ?Temperature (F): 98.1 ?Ambulatory Status: Ambulatory ?Pulse (bpm): 101 ?Discharge Destination: Home ?Respiratory Rate (breaths/min): 22 ?Transportation: Private Auto ?Blood Pressure (mmHg): 188/99 ?Accompanied By: alone ?Schedule Follow-up Appointment:  Yes ?Clinical Summary of Care: Patient Declined ?Electronic Signature(s) ?Signed: 12/07/2021 5:45:07 PM By: Zandra Abts RN, BSN ?Entered By: Zandra Abts on 12/07/2021 13:16:47 ?-------------------------------------------------------------------------------- ?Lower Extremity Assessment Details ?Patient Name: ?Date of Service: ?[name] [facility] 12/07/2021 9:15 A M ?Medical Record Number: [id number] ?Patient Account Number: [id number] ?Date of Birth/Sex: ?Treating RN: ?14-Nov-1965 (56 y.o. [name] ?Primary Care Khianna Blazina: PCP, NO ?Other Clinician: ?Referring Latona Krichbaum: ?Treating Codi Folkerts/Extender: Duanne Guess ?Hillery Aldo ?[name]: 2 ?Edema Assessment ?Assessed: [Left: No] [Right: No] ?Edema: [Left: Yes] [Right: Yes] ?Calf ?Left: Right: ?Point of Measurement: 40 cm From Medial Instep  52 cm 52.5 cm ?Ankle ?Left: Right: ?Point of Measurement: 10 cm From Medial Instep 27.5 cm 28 cm ?Vascular Assessment ?Pulses: ?Dorsalis Pedis ?Palpable: [Left:Yes] [Right:Yes] ?Electronic Signature(s) ?Signed: 12/07/2021 5:45:07 PM By: Zandra Abts RN, BSN ?Entered By: Zandra Abts on 12/07/2021 09:56:38 ?-------------------------------------------------------------------------------- ?Multi Wound Chart Details ?Patient Name: ?Date of Service: ?[name] [facility] 12/07/2021 9:15 A M ?Medical Record Number: [id number] ?Patient Account Number: [id number] ?Date of Birth/Sex: ?Treating RN: ?1966-01-30 (56 y.o. [name] ?Primary Care Toyna Erisman: PCP, NO ?Other Clinician: ?Referring Dontre Laduca: ?Treating Nester Bachus/Extender: Duanne Guess ?Hillery Aldo ?[name]: 2 ?Vital Signs ?Height(in): ?Pulse(bpm): 101 ?Weight(lbs): 400 ?Blood Pressure(mmHg): 188/99 ?Body Mass Index(BMI): ?Temperature(??F): 98.1 ?Respiratory Rate(breaths/min): 22 ?Photos: [N/A:N/A] ?Left, Circumferential Lower Leg Right, Posterior Lower Leg N/A ?Wound Location: ?Gradually Appeared Gradually Appeared N/A ?Wounding Event: ?Venous  Leg Ulcer Venous Leg Ulcer N/A ?Primary Etiology: ?Hypertension, Peripheral Venous Hypertension, Peripheral Venous N/A ?Comorbid History: ?Disease, Osteoarthritis Disease, Osteoarthritis ?10/30/2021 10/30/2021 N/A ?Date Acquired: ?2 2 N/A ?Weeks of Treatment: ?Open Open N/A ?Wound Status: ?No No N/A ?Wound Recurrence: ?Yes Yes N/A ?Clustered Wound: ?3 N/A N/A ?Clustered Quantity: ?3.2x3.5x0.1 0.1x0.1x0.1 N/A ?Measurements L x W x D (cm) ?8.796 0.008 N/A ?A (cm?) : ?rea ?0.88 0.001 N/A ?Volume (cm?) : ?97.30% 100.00% N/A ?% Reduction in Area: ?97.30% 100.00% N/A ?% Reduction in Volume: ?Full Thickness Without Exposed Full Thickness Without Exposed N/A ?Classification: ?Support Structures Support Structures ?Medium None Present N/A ?Exudate Amount: ?Serosanguineous N/A N/A ?Exudate Type: ?red, brown N/A N/A ?Exudate Color: ?Distinct, outline attached Distinct, outline attached N/A ?Wound Margin: ?Large (67-100%) None Present (0%) N/A ?Granulation Amount: ?Red N/A N/A ?Granulation Quality: ?None Present (0%) None Present (0%) N/A ?Necrotic Amount: ?Fat Layer (Subcutaneous Tissue): Yes Fascia: No N/A ?Exposed Structures: ?Fascia: No ?Fat Layer (Subcutaneous Tissue): No ?Tendon: No ?Tendon: No ?Muscle: No ?Muscle: No ?Joint: No ?Joint: No ?Bone: No ?Bone: No ?Medium (34-66%) Large (67-100%) N/A ?Epithelialization: ?Debridement - Selective/Open Wound Debridement - Selective/Open Wound N/A ?Debridement: ?Pre-procedure Verification/Time Out 09:58 09:58 N/A ?Taken: ?Skin/Epidermis Skin/Epidermis N/A ?Level: ?11.2 0.25 N/A ?Debridement A (sq cm): ?rea ?Curette Curette N/A ?Instrument: ?Minimum Minimum N/A ?Bleeding: ?Pressure Pressure N/A ?Hemostasis Achieved: ?0 0 N/A ?Procedural Pain: ?0 0 N/A ?Post Procedural Pain: ?Procedure was tolerated well Procedure was tolerated well N/A ?Debridement Treatment Response: ?3.2x2.5x0.1 0.1x0.1x0.1 N/A ?Post Debridement Measurements L x ?W x D (cm) ?0.628 0.001 N/A ?Post Debridement  Volume: (cm?) ?Compression Therapy Compression Therapy N/A ?Procedures Performed: ?Debridement Debridement ?Treatment Notes ?Electronic Signature(s) ?Signed: 12/07/2021 10:02:47 AM By: Duanne Guess MD FACS ?Signed: 3/23/202
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
## 2021-12-14 ENCOUNTER — Encounter (HOSPITAL_BASED_OUTPATIENT_CLINIC_OR_DEPARTMENT_OTHER): Payer: Medicare HMO | Admitting: General Surgery
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
## 2021-12-14 NOTE — Progress Notes (Signed)
JANZIEL, PACH ([id number]) ?Visit Report for 12/14/2021 ?Arrival Information Details ?Patient Name: Date of Service: ?Chad [name] [facility] 12/14/2021 10:00 A M ?Medical Record Number: [id number] ?Patient Account Number: [id number] ?Date of Birth/Sex: Treating RN: ?1966/08/08 (56 y.o. Chad [name] ?Primary Care Ilani Otterson: PCP, NO Other Clinician: ?Referring Smera Guyette: ?Treating Connelly Spruell/Extender: Chad [name] ?Chad [name] ?[name]: 3 ?Visit Information History Since Last Visit ?Added or deleted any medications: No ?Patient Arrived: Ambulatory ?Any new allergies or adverse reactions: No ?Arrival Time: 10:13 ?Had a fall or experienced change in No ?Accompanied By: alone ?activities of daily living that may affect ?Transfer Assistance: None ?risk of falls: ?Patient Identification Verified: Yes ?Signs or symptoms of abuse/neglect since last visito No ?Secondary Verification Process Completed: Yes ?Hospitalized since last visit: No ?Patient Requires Transmission-Based Precautions: No ?Implantable device outside of the clinic excluding No ?Patient Has Alerts: Yes ?cellular tissue based products placed in the center ?Patient Alerts: ABI non compressible bil since last visit: ?Has Dressing in Place as Prescribed: Yes ?Has Compression in Place as Prescribed: Yes ?Pain Present Now: No ?Electronic Signature(s) ?Signed: 12/14/2021 5:30:36 PM By: Chad Hurst RN, BSN ?Entered By: Chad [name] on 12/14/2021 10:13:26 ?-------------------------------------------------------------------------------- ?Compression Therapy Details ?Patient Name: Date of Service: ?Chad [name] [facility] 12/14/2021 10:00 A M ?Medical Record Number: [id number] ?Patient Account Number: [id number] ?Date of Birth/Sex: Treating RN: ?1966/01/29 (56 y.o. Chad [name] ?Primary Care Graig Hessling: PCP, NO Other Clinician: ?Referring Dameshia Seybold: ?Treating Rosanne Wohlfarth/Extender: Chad [name] ?Chad [name] ?[name]: 3 ?Compression Therapy  Performed for Wound Assessment: NonWound Condition Lymphedema - Bilateral Leg ?Performed By: Clinician Chad Hurst, RN ?Compression Type: Four Layer ?Post Procedure Diagnosis ?Same as Pre-procedure ?Electronic Signature(s) ?Signed: 12/14/2021 5:30:36 PM By: Chad Hurst RN, BSN ?Entered By: Chad [name] on 12/14/2021 10:32:08 ?-------------------------------------------------------------------------------- ?Encounter Discharge Information Details ?Patient Name: ?Date of Service: ?Chad [name] [facility] 12/14/2021 10:00 A M ?Medical Record Number: [id number] ?Patient Account Number: [id number] ?Date of Birth/Sex: ?Treating RN: ?10-Oct-1965 (56 y.o. Chad [name] ?Primary Care Zhane Bluitt: PCP, NO ?Other Clinician: ?Referring Jamice Carreno: ?Treating Nancy Manuele/Extender: Chad [name] ?Chad [name] ?[name]: 3 ?Encounter Discharge Information Items ?Discharge Condition: Stable ?Ambulatory Status: Ambulatory ?Discharge Destination: Home ?Transportation: Private Auto ?Accompanied By: alone ?Schedule Follow-up Appointment: Yes ?Clinical Summary of Care: Patient Declined ?Electronic Signature(s) ?Signed: 12/14/2021 5:30:36 PM By: Chad Hurst RN, BSN ?Entered By: Chad [name] on 12/14/2021 12:47:28 ?-------------------------------------------------------------------------------- ?Lower Extremity Assessment Details ?Patient Name: ?Date of Service: ?Chad [name] [facility] 12/14/2021 10:00 A M ?Medical Record Number: [id number] ?Patient Account Number: [id number] ?Date of Birth/Sex: ?Treating RN: ?1966/06/18 (56 y.o. Chad [name] ?Primary Care Shaan Rhoads: PCP, NO ?Other Clinician: ?Referring Daimion Adamcik: ?Treating Darra Rosa/Extender: Chad [name] ?Chad [name] ?[name]: 3 ?Edema Assessment ?Assessed: [Left: No] [Right: No] ?Edema: [Left: Yes] [Right: Yes] ?Calf ?Left: Right: ?Point of Measurement: 40 cm From Medial Instep 48 cm 50 cm ?Ankle ?Left: Right: ?Point of Measurement: 10 cm From Medial Instep 16 cm  27.4 cm ?Vascular Assessment ?Pulses: ?Dorsalis Pedis ?Palpable: [Left:Yes] [Right:Yes] ?Electronic Signature(s) ?Signed: 12/14/2021 5:30:36 PM By: Chad Hurst RN, BSN ?Entered By: Chad [name] on 12/14/2021 10:18:34 ?-------------------------------------------------------------------------------- ?Multi Wound Chart Details ?Patient Name: ?Date of Service: ?Chad [name] [facility] 12/14/2021 10:00 A M ?Medical Record Number: [id number] ?Patient Account Number: [id number] ?Date of Birth/Sex: ?Treating RN: ?05-13-1966 (56 y.o. Chad [name] ?Primary Care Torri Langston: PCP, NO ?Other Clinician: ?Referring Loreal Schuessler: ?Treating Meliana Canner/Extender: Chad [name] ?Chad [name] ?[name]: 3 ?Vital Signs ?Height(in): ?Pulse(bpm): 88 ?Weight(lbs):  400 ?Blood Pressure(mmHg): 178/101 ?Body Mass Index(BMI): ?Temperature(??F): 98.3 ?Respiratory Rate(breaths/min): 20 ?Photos: [N/A:N/A] ?Left, Circumferential Lower Leg Right, Posterior Lower Leg N/A ?Wound Location: ?Gradually Appeared Gradually Appeared N/A ?Wounding Event: ?Venous Leg Ulcer Venous Leg Ulcer N/A ?Primary Etiology: ?Hypertension, Peripheral Venous Hypertension, Peripheral Venous N/A ?Comorbid History: ?Disease, Osteoarthritis Disease, Osteoarthritis ?10/30/2021 10/30/2021 N/A ?Date Acquired: ?3 3 N/A ?Weeks of Treatment: ?Open Open N/A ?Wound Status: ?No No N/A ?Wound Recurrence: ?Yes Yes N/A ?Clustered Wound: ?1 N/A N/A ?Clustered Quantity: ?0x0x0 0x0x0 N/A ?Measurements L x W x D (cm) ?0 0 N/A ?A (cm?) : ?rea ?0 0 N/A ?Volume (cm?) : ?100.00% 100.00% N/A ?% Reduction in Area: ?100.00% 100.00% N/A ?% Reduction in Volume: ?Full Thickness Without Exposed Full Thickness Without Exposed N/A ?Classification: ?Support Structures Support Structures ?None Present None Present N/A ?Exudate Amount: ?Distinct, outline attached Distinct, outline attached N/A ?Wound Margin: ?None Present (0%) None Present (0%) N/A ?Granulation Amount: ?None Present (0%) None Present (0%)  N/A ?Necrotic Amount: ?Fascia: No ?Fascia: No N/A ?Exposed Structures: ?Fat Layer (Subcutaneous Tissue): No ?Fat Layer (Subcutaneous Tissue): No ?Tendon: No ?Tendon: No ?Muscle: No ?Muscle: No ?Joint: No ?Joint: No ?Bone: No ?Bone: No ?Large (67-100%) Large (67-100%) N/A ?Epithelialization: ?Treatment Notes ?Electronic Signature(s) ?Signed: 12/14/2021 11:22:40 AM By: Chad Maudlin MD FACS ?Signed: 12/14/2021 5:30:36 PM By: Chad Hurst RN, BSN ?Entered By: Chad [name] on 12/14/2021 11:22:40 ?-------------------------------------------------------------------------------- ?Multi-Disciplinary Care Plan Details ?Patient Name: ?Date of Service: ?Chad [name] [facility] 12/14/2021 10:00 A M ?Medical Record Number: [id number] ?Patient Account Number: [id number] ?Date of Birth/Sex: ?Treating RN: ?1966-03-14 (56 y.o. Chad [name] ?Primary Care Emeka Lindner: PCP, NO ?Other Clinician: ?Referring Kameren Baade: ?Treating Brookelle Pellicane/Extender: Chad [name] ?Chad [name] ?[name]: 3 ?Multidisciplinary Care Plan reviewed with physician ?Active Inactive ?Electronic Signature(s) ?Signed: 12/14/2021 5:30:36 PM By: Chad Hurst RN, BSN ?Entered By: Chad [name] on 12/14/2021 10:31:24 ?-------------------------------------------------------------------------------- ?Pain Assessment Details ?Patient Name: ?Date of Service: ?Chad [name] [facility] 12/14/2021 10:00 A M ?Medical Record Number: [id number] ?Patient Account Number: [id number] ?Date of Birth/Sex: ?Treating RN: ?[birth year] (56 y.o. Chad [name] ?Primary Care Marcell Pfeifer: PCP, NO ?Other Clinician: ?Referring Alnita Aybar: ?Treating Anjanae Woehrle/Extender: Chad [name] ?Chad [name] ?[name]: 3 ?Active Problems ?Location of Pain Severity and Description of Pain ?Patient Has Paino No ?Site Locations ?Pain Management and Medication ?Current Pain Management: ?Electronic Signature(s) ?Signed: 12/14/2021 5:30:36 PM By: Chad Hurst RN, BSN ?Entered By: Chad [name] on  12/14/2021 10:24:10 ?-------------------------------------------------------------------------------- ?Patient/Caregiver Education Details ?Patient Name: ?Date of Service: ?Chad [name], [place] [name] 3/30/2023andnbsp

## 2022-01-08 ENCOUNTER — Encounter (HOSPITAL_BASED_OUTPATIENT_CLINIC_OR_DEPARTMENT_OTHER): Payer: Medicare HMO | Attending: General Surgery | Admitting: General Surgery

## 2022-01-08 DIAGNOSIS — L97211 Non-pressure chronic ulcer of right calf limited to breakdown of skin: Secondary | ICD-10-CM | POA: Insufficient documentation

## 2022-01-08 DIAGNOSIS — I890 Lymphedema, not elsewhere classified: Secondary | ICD-10-CM | POA: Diagnosis not present

## 2022-01-08 DIAGNOSIS — L97222 Non-pressure chronic ulcer of left calf with fat layer exposed: Secondary | ICD-10-CM | POA: Diagnosis present

## 2022-01-08 NOTE — Progress Notes (Addendum)
KULE, HAGEDORN ([id number]) ?Visit Report for 01/08/2022 ?Chief Complaint Document Details ?Patient Name: Date of Service: ?Chad [name] [facility] 01/08/2022 9:30 A M ?Medical Record Number: [id number] ?Patient Account Number: [id number] ?Date of Birth/Sex: Treating RN: ?02/23/1966 (56 y.o. M) ?Primary Care Provider: PCP, NO Other Clinician: ?Referring Provider: ?Treating Provider/Extender: Fredirick Maudlin ?[name] ?[name]: 6 ?Information Obtained from: Patient ?Chief Complaint ?Bilateral lower extremity wounds ?Electronic Signature(s) ?Signed: 01/08/2022 10:31:07 AM By: Fredirick Maudlin MD FACS ?Entered By: Fredirick Maudlin on 01/08/2022 10:31:07 ?-------------------------------------------------------------------------------- ?Debridement Details ?Patient Name: Date of Service: ?Chad [name] [facility] 01/08/2022 9:30 A M ?Medical Record Number: [id number] ?Patient Account Number: [id number] ?Date of Birth/Sex: Treating RN: ?1966/08/24 (56 y.o. Jerilynn Mages) [name] ?Primary Care Provider: PCP, NO Other Clinician: ?Referring Provider: ?Treating Provider/Extender: Fredirick Maudlin ?[name] ?[name]: 6 ?Debridement Performed for Assessment: Wound #15 Circumferential Lower Leg ?Performed By: Physician Fredirick Maudlin, MD ?Debridement Type: Debridement ?Level of Consciousness (Pre-procedure): Awake and Alert ?Pre-procedure Verification/Time Out Yes - 10:23 ?Taken: ?Start Time: 10:23 ?Pain Control: Lidocaine 4% T opical Solution ?T Area Debrided (L x W): ?otal 36 (cm) x 20 (cm) = 720 (cm?) ?Tissue and other material debrided: Non-Viable, Slough, Subcutaneous, [place] ?Level: Skin/Subcutaneous Tissue ?Debridement Description: Excisional ?Instrument: Curette ?Bleeding: Minimum ?Hemostasis Achieved: Pressure ?End Time: 10:25 ?Procedural Pain: 0 ?Post Procedural Pain: 0 ?Response to Treatment: Procedure was tolerated well ?Level of Consciousness (Post- Awake and Alert ?procedure): ?Post Debridement  Measurements of Total Wound ?Length: (cm) 36 ?Width: (cm) 20 ?Depth: (cm) 0.1 ?Volume: (cm?) 56.549 ?Character of Wound/Ulcer Post Debridement: Improved ?Post Procedure Diagnosis ?Same as Pre-procedure ?Electronic Signature(s) ?Signed: 01/08/2022 10:57:27 AM By: Fredirick Maudlin MD FACS ?Signed: 01/08/2022 4:00:47 PM By: [name] RN ?Entered By: [name] on 01/08/2022 10:29:49 ?-------------------------------------------------------------------------------- ?Debridement Details ?Patient Name: ?Date of Service: ?Chad [name] [facility] 01/08/2022 9:30 A M ?Medical Record Number: [id number] ?Patient Account Number: [id number] ?Date of Birth/Sex: ?Treating RN: ?1966-04-17 (56 y.o. Jerilynn Mages) [name] ?Primary Care Provider: PCP, NO ?Other Clinician: ?Referring Provider: ?Treating Provider/Extender: Fredirick Maudlin ?[name] ?[name]: 6 ?Debridement Performed for Assessment: Wound #16 Right,Medial Lower Leg ?Performed By: Physician Fredirick Maudlin, MD ?Debridement Type: Debridement ?Level of Consciousness (Pre-procedure): Awake and Alert ?Pre-procedure Verification/Time Out Yes - 10:23 ?Taken: ?Start Time: 10:23 ?Pain Control: Lidocaine 4% T opical Solution ?T Area Debrided (L x W): ?otal 0.5 (cm) x 0.9 (cm) = 0.45 (cm?) ?Tissue and other material debrided: Viable, Non-Viable, Slough, Subcutaneous, Slough ?Level: Skin/Subcutaneous Tissue ?Debridement Description: Excisional ?Instrument: Curette ?Bleeding: Minimum ?Hemostasis Achieved: Pressure ?End Time: 10:25 ?Procedural Pain: 0 ?Post Procedural Pain: 0 ?Response to Treatment: Procedure was tolerated well ?Level of Consciousness (Post- Awake and Alert ?procedure): ?Post Debridement Measurements of Total Wound ?Length: (cm) 0.5 ?Width: (cm) 0.9 ?Depth: (cm) 0.1 ?Volume: (cm?) 0.035 ?Character of Wound/Ulcer Post Debridement: Improved ?Post Procedure Diagnosis ?Same as Pre-procedure ?Electronic Signature(s) ?Signed: 01/08/2022 10:57:27 AM By: Fredirick Maudlin MD FACS ?Signed: 01/08/2022 4:00:47 PM By: [name] RN ?Entered By: [name] on 01/08/2022 10:31:16 ?-------------------------------------------------------------------------------- ?Debridement Details ?Patient Name: ?Date of Service: ?Chad [name] [facility] 01/08/2022 9:30 A M ?Medical Record Number: [id number] ?Patient Account Number: [id number] ?Date of Birth/Sex: ?Treating RN: ?[birth year] (56 y.o. Jerilynn Mages) [name] ?Primary Care Provider: PCP, NO ?Other Clinician: ?Referring Provider: ?Treating Provider/Extender: Fredirick Maudlin ?[name] ?[name]: 6 ?Debridement Performed for Assessment: Wound #17 Right,Anterior Lower Leg ?Performed By: Physician Fredirick Maudlin, MD ?Debridement Type: Debridement ?Level of Consciousness (Pre-procedure): Awake  and Alert ?Pre-procedure Verification/Time Out Yes - 10:23 ?Taken: ?Start Time: 10:23 ?Pain Control: Lidocaine 4% T opical Solution ?T Area Debrided (L x W): ?otal 2.5 (cm) x 1.8 (cm) = 4.5 (cm?) ?Tissue and other material debrided: Viable, Non-Viable, Slough, Subcutaneous, Slough ?Level: Skin/Subcutaneous Tissue ?Debridement Description: Excisional ?Instrument: Curette ?Bleeding: Minimum ?Hemostasis Achieved: Pressure ?End Time: 10:25 ?Procedural Pain: 0 ?Post Procedural Pain: 0 ?Response to Treatment: Procedure was tolerated well ?Level of Consciousness (Post- Awake and Alert ?procedure): ?Post Debridement Measurements of Total Wound ?Length: (cm) 2.5 ?Width: (cm) 1.8 ?Depth: (cm) 0.1 ?Volume: (cm?) 0.353 ?Character of Wound/Ulcer Post Debridement: Improved ?Post Procedure Diagnosis ?Same as Pre-procedure ?Electronic Signature(s) ?Signed: 01/08/2022 10:57:27 AM By: Fredirick Maudlin MD FACS ?Signed: 01/08/2022 4:00:47 PM By: [name] RN ?Entered By: [name] on 01/08/2022 10:32:01 ?-------------------------------------------------------------------------------- ?Debridement Details ?Patient Name: ?Date of Service: ?Chad [name] [facility] 01/08/2022 9:30 A M ?Medical Record Number: [id number] ?Patient Account Number: [id number] ?Date of Birth/Sex: ?Treating RN: ?1966-05-11 (56 y.o. Jerilynn Mages) [name] ?Primary Care Provider: PCP, NO ?Other Clinician: ?Referring Provider: ?Treating Provider/Extender: Fredirick Maudlin ?[name] ?[name]: 6 ?Debridement Performed for Assessment: Wound #18 Right,Lateral Lower Leg ?Performed By: Physician Fredirick Maudlin, MD ?Debridement Type: Debridement ?Level of Consciousness (Pre-procedure): Awake and Alert ?Pre-procedure Verification/Time Out Yes - 10:23 ?Taken: ?Start Time: 10:23 ?Pain Control: Lidocaine 4% T opical Solution ?T Area Debrided (L x W): ?otal 4.5 (cm) x 1 (cm) = 4.5 (cm?) ?Tissue and other material debrided: Non-Viable, Slough, Subcutaneous, [place] ?Level: Skin/Subcutaneous Tissue ?Debridement Description: Excisional ?Instrument: Curette ?Bleeding: Minimum ?Hemostasis Achieved: Pressure ?End Time: 10:25 ?Procedural Pain: 0 ?Post Procedural Pain: 0 ?Response to Treatment: Procedure was tolerated well ?Level of Consciousness (Post- ?Level of Consciousness (Post- Awake and Alert ?procedure): ?Post Debridement Measurements of Total Wound ?Length: (cm) 4.5 ?Width: (cm) 1 ?Depth: (cm) 0.1 ?Volume: (cm?) 0.353 ?Character of Wound/Ulcer Post Debridement: Improved ?Post Procedure Diagnosis ?Same as Pre-procedure ?Electronic Signature(s) ?Signed: 01/08/2022 10:57:27 AM By: Fredirick Maudlin MD FACS ?Signed: 01/08/2022 4:00:47 PM By: [name] RN ?Entered By: [name] on 01/08/2022 10:32:44 ?-------------------------------------------------------------------------------- ?HPI Details ?Patient Name: Date of Service: ?Chad [name] [facility] 01/08/2022 9:30 A M ?Medical Record Number: [id number] ?Patient Account Number: [id number] ?Date of Birth/Sex: Treating RN: ?Aug 05, 1966 (56 y.o. M) ?Primary Care Provider: PCP, NO Other Clinician: ?Referring Provider: ?Treating Provider/Extender: Fredirick Maudlin ?[name] ?[name]: 6 ?History of Present Illness ?HPI Description: Admission 5/18 ?Mr. Zeplin Gianelli is a 56 year old male with a past medical history of chronic venous insufficiency a

## 2022-01-08 NOTE — Progress Notes (Signed)
SPENSER, HARREN ([id number]) ?Visit Report for 01/08/2022 ?Arrival Information Details ?Patient Name: Date of Service: ?Chad [name] [facility] 01/08/2022 9:30 A M ?Medical Record Number: [id number] ?Patient Account Number: [id number] ?Date of Birth/Sex: Treating RN: ?15-Jan-1966 (56 y.o. Judie Petit) Karie Schwalbe ?Primary Care Ayaat Jansma: PCP, NO Other Clinician: ?Referring Petrea Fredenburg: ?Treating Annalena Piatt/Extender: Duanne Guess ?Hillery Aldo ?[name]: 6 ?Visit Information History Since Last Visit ?Added or deleted any medications: No ?Patient Arrived: Ambulatory ?Any new allergies or adverse reactions: No ?Arrival Time: 09:52 ?Had a fall or experienced change in No ?Accompanied By: self ?activities of daily living that may affect ?Transfer Assistance: None ?risk of falls: ?Patient Identification Verified: Yes ?Signs or symptoms of abuse/neglect since last visito No ?Patient Requires Transmission-Based Precautions: No ?Hospitalized since last visit: No ?Patient Has Alerts: Yes ?Implantable device outside of the clinic excluding No ?Patient Alerts: ABI non compressible bil ?cellular tissue based products placed in the center ?since last visit: ?Pain Present Now: Yes ?Electronic Signature(s) ?Signed: 01/08/2022 4:00:47 PM By: Karie Schwalbe RN ?Entered By: Karie Schwalbe on 01/08/2022 09:52:47 ?-------------------------------------------------------------------------------- ?Compression Therapy Details ?Patient Name: Date of Service: ?Chad [name] [facility] 01/08/2022 9:30 A M ?Medical Record Number: [id number] ?Patient Account Number: [id number] ?Date of Birth/Sex: Treating RN: ?1966-08-08 (56 y.o. Judie Petit) Karie Schwalbe ?Primary Care Monnie Gudgel: PCP, NO Other Clinician: ?Referring Khiley Lieser: ?Treating Curtina Grills/Extender: Duanne Guess ?Hillery Aldo ?[name]: 6 ?Compression Therapy Performed for Wound Assessment: Wound #15 Circumferential Lower Leg ?Performed By: Clinician Karie Schwalbe, RN ?Compression Type: Four  Layer ?Post Procedure Diagnosis ?Same as Pre-procedure ?Notes ?First Layer is [employer] ?Electronic Signature(s) ?Signed: 01/08/2022 4:00:47 PM By: Karie Schwalbe RN ?Entered By: Karie Schwalbe on 01/08/2022 10:33:54 ?-------------------------------------------------------------------------------- ?Compression Therapy Details ?Patient Name: ?Date of Service: ?Chad [name] [facility] 01/08/2022 9:30 A M ?Medical Record Number: [id number] ?Patient Account Number: [id number] ?Date of Birth/Sex: ?Treating RN: ?13-Jan-1966 (56 y.o. Judie Petit) Karie Schwalbe ?Primary Care Sir Mallis: PCP, NO ?Other Clinician: ?Referring Anni Hocevar: ?Treating Cortland Crehan/Extender: Duanne Guess ?Hillery Aldo ?[name]: 6 ?Compression Therapy Performed for Wound Assessment: Wound #16 Right,Medial Lower Leg ?Performed By: Clinician Karie Schwalbe, RN ?Compression Type: Four Layer ?Post Procedure Diagnosis ?Same as Pre-procedure ?Notes ?First Layer [employer] ?Electronic Signature(s) ?Signed: 01/08/2022 4:00:47 PM By: Karie Schwalbe RN ?Entered By: Karie Schwalbe on 01/08/2022 10:34:38 ?-------------------------------------------------------------------------------- ?Compression Therapy Details ?Patient Name: ?Date of Service: ?Chad [name] [facility] 01/08/2022 9:30 A M ?Medical Record Number: [id number] ?Patient Account Number: [id number] ?Date of Birth/Sex: ?Treating RN: ?[birth year] (56 y.o. Judie Petit) Karie Schwalbe ?Primary Care Rasaan Brotherton: PCP, NO ?Other Clinician: ?Referring Gionni Vaca: ?Treating Stetson Pelaez/Extender: Duanne Guess ?Hillery Aldo ?[name]: 6 ?Compression Therapy Performed for Wound Assessment: Wound #17 Right,Anterior Lower Leg ?Performed By: Clinician Karie Schwalbe, RN ?Compression Type: Four Layer ?Post Procedure Diagnosis ?Same as Pre-procedure ?Notes ?First Layer [employer] ?Electronic Signature(s) ?Signed: 01/08/2022 4:00:47 PM By: Karie Schwalbe RN ?Entered By: Karie Schwalbe on 01/08/2022  10:34:38 ?-------------------------------------------------------------------------------- ?Compression Therapy Details ?Patient Name: ?Date of Service: ?Chad [name] [facility] 01/08/2022 9:30 A M ?Medical Record Number: [id number] ?Patient Account Number: [id number] ?Date of Birth/Sex: ?Treating RN: ?1965-12-25 (56 y.o. Judie Petit) Karie Schwalbe ?Primary Care Drenda Sobecki: PCP, NO ?Other Clinician: ?Referring Kyani Simkin: ?Treating Bettyann Birchler/Extender: Duanne Guess ?Hillery Aldo ?[name]: 6 ?Compression Therapy Performed for Wound Assessment: Wound #18 Right,Lateral Lower Leg ?Performed By: Clinician Karie Schwalbe, RN ?Compression Type: Four Layer ?Post Procedure Diagnosis ?Same as Pre-procedure ?Notes ?First Layer [employer] ?Electronic Signature(s) ?Signed: 01/08/2022 4:00:47 PM By: Karie Schwalbe RN ?  Entered By: Karie Schwalbe on 01/08/2022 10:34:38 ?-------------------------------------------------------------------------------- ?Encounter Discharge Information Details ?Patient Name: ?Date of Service: ?Chad [name] [facility] 01/08/2022 9:30 A M ?Medical Record Number: [id number] ?Patient Account Number: [id number] ?Date of Birth/Sex: ?Treating RN: ?28-Jun-1966 (56 y.o. Judie Petit) Karie Schwalbe ?Primary Care Dylan Ruotolo: PCP, NO ?Other Clinician: ?Referring Chanise Habeck: ?Treating Gavrielle Streck/Extender: Duanne Guess ?Hillery Aldo ?[name]: 6 ?Encounter Discharge Information Items Post Procedure Vitals ?Discharge Condition: Stable ?Temperature (F): 98.3 ?Ambulatory Status: Ambulatory ?Pulse (bpm): 96 ?Discharge Destination: Home ?Respiratory Rate (breaths/min): 20 ?Transportation: Private Auto ?Blood Pressure (mmHg): 182/107 ?Accompanied By: self ?Schedule Follow-up Appointment: Yes ?Clinical Summary of Care: Patient Declined ?Electronic Signature(s) ?Signed: 01/08/2022 4:00:47 PM By: Karie Schwalbe RN ?Entered By: Karie Schwalbe on 01/08/2022  15:59:26 ?-------------------------------------------------------------------------------- ?Lower Extremity Assessment Details ?Patient Name: ?Date of Service: ?Chad [name] [facility] 01/08/2022 9:30 A M ?Medical Record Number: [id number] ?Patient Account Number: [id number] ?Date of Birth/Sex: ?Treating RN: ?22-Nov-1965 (56 y.o. Judie Petit) Karie Schwalbe ?Primary Care Franco Duley: PCP, NO ?Other Clinician: ?Referring Hannan Tetzlaff: ?Treating Magdelene Ruark/Extender: Duanne Guess ?Hillery Aldo ?[name]: 6 ?Edema Assessment ?Assessed: [Left: No] [Right: No] ?Edema: [Left: Yes] [Right: Yes] ?Calf ?Left: Right: ?Point of Measurement: 40 cm From Medial Instep 52.8 cm 50 cm ?Ankle ?Left: Right: ?Point of Measurement: 10 cm From Medial Instep 26.9 cm 27.4 cm ?Electronic Signature(s) ?Signed: 01/08/2022 4:00:47 PM By: Karie Schwalbe RN ?Entered By: Karie Schwalbe on 01/08/2022 09:57:35 ?-------------------------------------------------------------------------------- ?Multi Wound Chart Details ?Patient Name: ?Date of Service: ?Chad [name] [facility] 01/08/2022 9:30 A M ?Medical Record Number: [id number] ?Patient Account Number: [id number] ?Date of Birth/Sex: ?Treating RN: ?[birth year] (56 y.o. M) ?Primary Care Tylea Hise: PCP, NO ?Other Clinician: ?Referring Jaileigh Weimer: ?Treating Norris Bodley/Extender: Duanne Guess ?Hillery Aldo ?[name]: 6 ?Vital Signs ?Height(in): 76 ?Pulse(bpm): 96 ?Weight(lbs): 400 ?Blood Pressure(mmHg): 182/107 ?Body Mass Index(BMI): 48.7 ?Temperature(??F): 98.3 ?Respiratory Rate(breaths/min): 20 ?Photos: ?Circumferential Lower Leg Right, Medial Lower Leg Right, Anterior Lower Leg ?Wound Location: ?Gradually Appeared Gradually Appeared Gradually Appeared ?Wounding Event: ?Lymphedema Lymphedema Lymphedema ?Primary Etiology: ?Hypertension, Peripheral Venous Hypertension, Peripheral Venous Hypertension, Peripheral Venous ?Comorbid History: ?Disease, Osteoarthritis Disease, Osteoarthritis Disease,  Osteoarthritis ?12/25/2021 12/25/2021 12/25/2021 ?Date Acquired: ?0 0 0 ?Weeks of Treatment: ?Open Open Open ?Wound Status: ?No No No ?Wound Recurrence: ?No No No ?Clustered Wound: ?36x20x0.1 0.5x0.9x0.1 2.5x1.8x0.1 ?Measurements L x W x D (cm) ?565.487 0.353 3.534 ?A (cm?) : ?rea ?56.549 0.035 0.353 ?Volume (cm?) : ?0.00% 0.00% 0.00% ?% Reduction in A r

## 2022-01-15 ENCOUNTER — Encounter (HOSPITAL_BASED_OUTPATIENT_CLINIC_OR_DEPARTMENT_OTHER): Payer: Medicare HMO | Attending: General Surgery | Admitting: General Surgery

## 2022-01-15 DIAGNOSIS — L97222 Non-pressure chronic ulcer of left calf with fat layer exposed: Secondary | ICD-10-CM | POA: Insufficient documentation

## 2022-01-15 DIAGNOSIS — I890 Lymphedema, not elsewhere classified: Secondary | ICD-10-CM | POA: Diagnosis not present

## 2022-01-15 DIAGNOSIS — L97211 Non-pressure chronic ulcer of right calf limited to breakdown of skin: Secondary | ICD-10-CM | POA: Insufficient documentation
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
## 2022-01-22 ENCOUNTER — Encounter (HOSPITAL_BASED_OUTPATIENT_CLINIC_OR_DEPARTMENT_OTHER): Payer: Medicare HMO | Admitting: General Surgery

## 2022-01-22 DIAGNOSIS — I890 Lymphedema, not elsewhere classified: Secondary | ICD-10-CM | POA: Diagnosis not present

## 2022-01-22 NOTE — Progress Notes (Signed)
JAWUAN, ROBB ([id number]) ?Visit Report for 01/22/2022 ?Arrival Information Details ?Patient Name: Date of Service: ?Chad [name] [facility] 01/22/2022 11:00 A M ?Medical Record Number: [id number] ?Patient Account Number: [id number] ?Date of Birth/Sex: Treating RN: ?[birth year] (56 y.o. Judie Petit) Karie Schwalbe ?Primary Care Kalab Camps: PCP, NO Other Clinician: ?Referring Deeana Atwater: ?Treating Tamelia Michalowski/Extender: Duanne Guess ?Hillery Aldo ?[name]: 8 ?Visit Information History Since Last Visit ?Added or deleted any medications: No ?Patient Arrived: Ambulatory ?Any new allergies or adverse reactions: No ?Arrival Time: 11:31 ?Had a fall or experienced change in No ?Accompanied By: Self ?activities of daily living that may affect ?Transfer Assistance: None ?risk of falls: ?Patient Requires Transmission-Based Precautions: No ?Signs or symptoms of abuse/neglect since last visito No ?Patient Has Alerts: Yes ?Hospitalized since last visit: No ?Patient Alerts: ABI non compressible bil ?Implantable device outside of the clinic excluding No ?cellular tissue based products placed in the center ?since last visit: ?Has Dressing in Place as Prescribed: Yes ?Has Compression in Place as Prescribed: Yes ?Pain Present Now: No ?Electronic Signature(s) ?Signed: 01/22/2022 5:56:38 PM By: Karie Schwalbe RN ?Entered By: Karie Schwalbe on 01/22/2022 11:32:19 ?-------------------------------------------------------------------------------- ?Compression Therapy Details ?Patient Name: Date of Service: ?Chad [name] [facility] 01/22/2022 11:00 A M ?Medical Record Number: [id number] ?Patient Account Number: [id number] ?Date of Birth/Sex: Treating RN: ?1966/01/29 (56 y.o. Judie Petit) Karie Schwalbe ?Primary Care Yahmir Sokolov: PCP, NO Other Clinician: ?Referring Deyna Carbon: ?Treating Tahiri Shareef/Extender: Duanne Guess ?Hillery Aldo ?[name]: 8 ?Compression Therapy Performed for Wound Assessment: Wound #15 Left,Circumferential Lower Leg ?Performed By: Clinician  Karie Schwalbe, RN ?Compression Type: Four Layer ?Post Procedure Diagnosis ?Same as Pre-procedure ?Electronic Signature(s) ?Signed: 01/22/2022 5:56:38 PM By: Karie Schwalbe RN ?Entered By: Karie Schwalbe on 01/22/2022 11:49:08 ?-------------------------------------------------------------------------------- ?Compression Therapy Details ?Patient Name: ?Date of Service: ?Chad [name] [facility] 01/22/2022 11:00 A M ?Medical Record Number: [id number] ?Patient Account Number: [id number] ?Date of Birth/Sex: ?Treating RN: ?1965-10-30 (56 y.o. Judie Petit) Karie Schwalbe ?Primary Care Kevona Lupinacci: PCP, NO ?Other Clinician: ?Referring Kiyoto Slomski: ?Treating Jacqulyn Barresi/Extender: Duanne Guess ?Hillery Aldo ?[name]: 8 ?Compression Therapy Performed for Wound Assessment: Wound #17 Right,Anterior Lower Leg ?Performed By: Clinician Karie Schwalbe, RN ?Compression Type: Four Layer ?Post Procedure Diagnosis ?Same as Pre-procedure ?Electronic Signature(s) ?Signed: 01/22/2022 5:56:38 PM By: Karie Schwalbe RN ?Entered By: Karie Schwalbe on 01/22/2022 11:49:08 ?-------------------------------------------------------------------------------- ?Compression Therapy Details ?Patient Name: ?Date of Service: ?Chad [name] [facility] 01/22/2022 11:00 A M ?Medical Record Number: [id number] ?Patient Account Number: [id number] ?Date of Birth/Sex: ?Treating RN: ?1966-03-08 (56 y.o. Judie Petit) Karie Schwalbe ?Primary Care Deng Kemler: PCP, NO ?Other Clinician: ?Referring Jeramyah Goodpasture: ?Treating Shataria Crist/Extender: Duanne Guess ?Hillery Aldo ?[name]: 8 ?Compression Therapy Performed for Wound Assessment: Wound #18 Right,Lateral Lower Leg ?Performed By: Clinician Karie Schwalbe, RN ?Compression Type: Four Layer ?Post Procedure Diagnosis ?Same as Pre-procedure ?Electronic Signature(s) ?Signed: 01/22/2022 5:56:38 PM By: Karie Schwalbe RN ?Entered By: Karie Schwalbe on 01/22/2022  11:49:08 ?-------------------------------------------------------------------------------- ?Encounter Discharge Information Details ?Patient Name: ?Date of Service: ?Chad [name] [facility] 01/22/2022 11:00 A M ?Medical Record Number: [id number] ?Patient Account Number: [id number] ?Date of Birth/Sex: ?Treating RN: ?07/12/1966 (56 y.o. Judie Petit) Karie Schwalbe ?Primary Care Mahika Vanvoorhis: PCP, NO ?Other Clinician: ?Referring Salimatou Simone: ?Treating Brandyn Thien/Extender: Duanne Guess ?Hillery Aldo ?[name]: 8 ?Encounter Discharge Information Items Post Procedure Vitals ?Discharge Condition: Stable ?Temperature (F): 98 ?Ambulatory Status: Ambulatory ?Pulse (bpm): 97 ?Discharge Destination: Home ?Respiratory Rate (breaths/min): 18 ?Transportation: Private Auto ?Blood Pressure (mmHg): 176/113 ?Accompanied By: self ?Schedule Follow-up Appointment: Yes ?Clinical Summary of Care: Patient Declined ?Electronic  Signature(s) ?Signed: 01/22/2022 5:56:38 PM By: Karie Schwalbe RN ?Entered By: Karie Schwalbe on 01/22/2022 17:51:09 ?-------------------------------------------------------------------------------- ?Lower Extremity Assessment Details ?Patient Name: ?Date of Service: ?Chad [name] [facility] 01/22/2022 11:00 A M ?Medical Record Number: [id number] ?Patient Account Number: [id number] ?Date of Birth/Sex: ?Treating RN: ?[birth year] (56 y.o. Judie Petit) Karie Schwalbe ?Primary Care Taishaun Levels: PCP, NO ?Other Clinician: ?Referring Adarryl Goldammer: ?Treating Sussie Minor/Extender: Duanne Guess ?Hillery Aldo ?[name]: 8 ?Edema Assessment ?Assessed: [Left: No] [Right: No] ?Edema: [Left: Yes] [Right: Yes] ?Calf ?Left: Right: ?Point of Measurement: 40 cm From Medial Instep 55.6 cm 54.3 cm ?Ankle ?Left: Right: ?Point of Measurement: 10 cm From Medial Instep 28.6 cm 27.1 cm ?Electronic Signature(s) ?Signed: 01/22/2022 5:56:38 PM By: Karie Schwalbe RN ?Entered By: Karie Schwalbe on 01/22/2022  11:37:22 ?-------------------------------------------------------------------------------- ?Multi Wound Chart Details ?Patient Name: ?Date of Service: ?Chad [name] [facility] 01/22/2022 11:00 A M ?Medical Record Number: [id number] ?Patient Account Number: [id number] ?Date of Birth/Sex: ?Treating RN: ?Sep 05, 1966 (56 y.o. Judie Petit) Karie Schwalbe ?Primary Care Zaidy Absher: PCP, NO ?Other Clinician: ?Referring Marlin Brys: ?Treating Josia Cueva/Extender: Duanne Guess ?Hillery Aldo ?[name]: 8 ?Vital Signs ?Height(in): 76 ?Pulse(bpm): 97 ?Weight(lbs): 400 ?Blood Pressure(mmHg): 176/113 ?Body Mass Index(BMI): 48.7 ?Temperature(??F): 98 ?Respiratory Rate(breaths/min): 18 ?Photos: [15:Left, Circumferential Lower Leg] [16:Right, Medial Lower Leg] [17:Right, Anterior Lower Leg] ?Wound Location: [15:Gradually Appeared] [16:Gradually Appeared] [17:Gradually Appeared] ?Wounding Event: [15:Lymphedema] [16:Lymphedema] [17:Lymphedema] ?Primary Etiology: [15:Hypertension, Peripheral Venous] [16:Hypertension, Peripheral Venous] [17:Hypertension, Peripheral Venous] ?Comorbid History: [15:Disease, Osteoarthritis 12/25/2021] [16:Disease, Osteoarthritis 12/25/2021] [17:Disease, Osteoarthritis 12/25/2021] ?Date Acquired: [15:2] [16:2] [17:2] ?Weeks of Treatment: [15:Open] [16:Healed - Epithelialized] [17:Open] ?Wound Status: [15:No] [16:No] [17:No] ?Wound Recurrence: [15:No] [16:No] [17:No] ?Clustered Wound: [15:0.3x0.3x0.1] [16:0x0x0] [17:0.7x0.7x0.1] ?Measurements L x W x D (cm) [15:0.071] [16:0] [17:0.385] ?A (cm?) : ?rea [15:0.007] [16:0] [17:0.038] ?Volume (cm?) : [15:100.00%] [16:100.00%] [17:89.10%] ?% Reduction in A [15:rea: 100.00%] [16:100.00%] [17:89.20%] ?% Reduction in Volume: [15:Full Thickness Without Exposed] [16:Full Thickness Without Exposed] [17:Full Thickness Without Exposed] ?Classification: [15:Support Structures Large] [16:Support Structures None Present] [17:Support Structures Medium] ?Exudate A mount: [15:Serosanguineous]  [16:N/A] [17:Serosanguineous] ?Exudate Type: [15:red, brown] [16:N/A] [17:red, brown] ?Exudate Color: [15:Flat and Intact] [16:Flat and Intact] [17:Distinct, outline attached] ?Wound Margin: [15:Large (67-100%)] [16:None Present (0%)] [17:Large (67-100%)] ?Granulation A mount: [15:Red, Pink] [16:N/A] [17:Red] ?Granulation Quality: [15:None Present (0%)] [id number]
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
## 2022-01-30 ENCOUNTER — Encounter (HOSPITAL_BASED_OUTPATIENT_CLINIC_OR_DEPARTMENT_OTHER): Payer: Medicare HMO | Admitting: General Surgery

## 2022-02-06 ENCOUNTER — Encounter (HOSPITAL_BASED_OUTPATIENT_CLINIC_OR_DEPARTMENT_OTHER): Payer: Medicare HMO | Admitting: General Surgery

## 2022-02-06 DIAGNOSIS — I890 Lymphedema, not elsewhere classified: Secondary | ICD-10-CM | POA: Diagnosis not present

## 2022-02-06 NOTE — Progress Notes (Addendum)
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 Erythematosus; Raynauds; Scleroderma Integumentary (Skin) Medical History: Negative for: History of Burn Musculoskeletal Medical History: Positive for: Osteoarthritis - left hip Negative for: Gout; Rheumatoid Arthritis; Osteomyelitis Neurologic Medical History: Negative for: Dementia; Neuropathy; Quadriplegia; Paraplegia; Seizure Disorder Oncologic Medical History: Negative for: Received Chemotherapy; Received Radiation Psychiatric Medical History: Negative for: Anorexia/bulimia; Confinement Anxiety Immunizations Pneumococcal Vaccine: Received Pneumococcal Vaccination: No Implantable Devices None Family and Social History Cancer: Yes - Mother,Father; Never smoker; Marital Status - Married; Alcohol Use: Rarely; Drug Use: No History; Caffeine Use: Rarely; Financial Concerns: No; Food, Clothing or Shelter Needs: No;  Support System Lacking: No; Transportation Concerns: No Electronic Signature(s) Signed: 02/06/2022 12:11:34 PM By: Duanne Guess MD FACS Signed: 02/06/2022 5:44:44 PM By: Gelene Mink By: Duanne Guess on 02/06/2022 10:36:50 -------------------------------------------------------------------------------- SuperBill Details Patient Name: Date of Service: Chad [name] [facility] 02/06/2022 Medical Record Number: [id number] Patient Account Number: [id number] Date of Birth/Sex: Treating RN: 1966/09/03 (56 y.o. 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 Facility Procedures CPT4 Code: [id number] Description: 99213 - WOUND CARE VISIT-LEV 3 EST PT Modifier: Quantity: 1 Physician Procedures : CPT4 Code Description Modifier [id number] 99213 - WC PHYS LEVEL 3 - EST PT ICD-10 Diagnosis Description 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

## 2022-02-06 NOTE — Progress Notes (Signed)
SHYMIR, LAHRMAN ([id number]) Visit Report for 02/06/2022 Arrival Information Details Patient Name: Date of Service: Chad [name] [facility] 02/06/2022 9:15 A M Medical Record Number: [id number] Patient Account Number: [id number] Date of Birth/Sex: Treating RN: [birth year] (56 y.o. [name] Primary Care Chad [name]: PCP, NO Other Clinician: Referring Chad [name]: Treating Chad [name] [name]: 10 Visit Information History Since Last Visit Added or deleted any medications: No Patient Arrived: Ambulatory Any new allergies or adverse reactions: No Arrival Time: 10:05 Had a fall or experienced change in No Accompanied By: self activities of daily living that may affect Transfer Assistance: None risk of falls: Patient Identification Verified: Yes Signs or symptoms of abuse/neglect since last visito No Secondary Verification Process Completed: Yes Hospitalized since last visit: No Patient Requires Transmission-Based Precautions: No Implantable device outside of the clinic excluding No Patient Has Alerts: Yes cellular tissue based products placed in the center Patient Alerts: ABI non compressible bil since last visit: Has Dressing in Place as Prescribed: Yes Has Compression in Place as Prescribed: Yes Pain Present Now: No Electronic Signature(s) Signed: 02/06/2022 5:44:44 PM By: Adline Peals Entered By: Adline Peals on 02/06/2022 10:07:31 -------------------------------------------------------------------------------- Clinic Level of Care Assessment Details Patient Name: Date of Service: Chad [name] [facility] 02/06/2022 9:15 A M Medical Record Number: [id number] Patient Account Number: [id number] Date of Birth/Sex: Treating RN: Jun 01, 1966 (56 y.o. [name] Primary Care Chad [name]: PCP, NO Other Clinician: Referring Chad [name]: Treating Chad [name]/Extender: Darliss [name] [name]: 10 Clinic  Level of Care Assessment Items TOOL 4 Quantity Score X- 1 0 Use when only an EandM is performed on FOLLOW-UP visit ASSESSMENTS - Nursing Assessment / Reassessment X- 1 10 Reassessment of Co-morbidities (includes updates in patient status) X- 1 5 Reassessment of Adherence to Treatment Plan ASSESSMENTS - Wound and Skin A ssessment / Reassessment X - Simple Wound Assessment / Reassessment - one wound 1 5 []  - 0 Complex Wound Assessment / Reassessment - multiple wounds []  - 0 Dermatologic / Skin Assessment (not related to wound area) ASSESSMENTS - Focused Assessment X- 1 5 Circumferential Edema Measurements - multi extremities []  - 0 Nutritional Assessment / Counseling / Intervention []  - 0 Lower Extremity Assessment (monofilament, tuning fork, pulses) []  - 0 Peripheral Arterial Disease Assessment (using hand held doppler) ASSESSMENTS - Ostomy and/or Continence Assessment and Care []  - 0 Incontinence Assessment and Management []  - 0 Ostomy Care Assessment and Management (repouching, etc.) PROCESS - Coordination of Care X - Simple Patient / Family Education for ongoing care 1 15 []  - 0 Complex (extensive) Patient / Family Education for ongoing care X- 1 10 Staff obtains Programmer, systems, Records, T Results / Process Orders est []  - 0 Staff telephones HHA, Nursing Homes / Clarify orders / etc []  - 0 Routine Transfer to another Facility (non-emergent condition) []  - 0 Routine Hospital Admission (non-emergent condition) []  - 0 New Admissions / Biomedical engineer / Ordering NPWT Apligraf, etc. , []  - 0 Emergency Hospital Admission (emergent condition) X- 1 10 Simple Discharge Coordination []  - 0 Complex (extensive) Discharge Coordination PROCESS - Special Needs []  - 0 Pediatric / Minor Patient Management []  - 0 Isolation Patient Management []  - 0 Hearing / Language / Visual special needs []  - 0 Assessment of Community assistance (transportation, D/C planning, etc.) []   - 0 Additional assistance / Altered mentation []  - 0 Support Surface(s) Assessment (bed, cushion, seat, etc.) INTERVENTIONS - Wound Cleansing / Measurement []  - 0 Simple Wound  Cleansing - one wound X- 1 5 Complex Wound Cleansing - multiple wounds X- 1 5 Wound Imaging (photographs - any number of wounds) []  - 0 Wound Tracing (instead of photographs) []  - 0 Simple Wound Measurement - one wound X- 1 5 Complex Wound Measurement - multiple wounds INTERVENTIONS - Wound Dressings []  - 0 Small Wound Dressing one or multiple wounds []  - 0 Medium Wound Dressing one or multiple wounds []  - 0 Large Wound Dressing one or multiple wounds []  - 0 Application of Medications - topical []  - 0 Application of Medications - injection INTERVENTIONS - Miscellaneous []  - 0 External ear exam []  - 0 Specimen Collection (cultures, biopsies, blood, body fluids, etc.) []  - 0 Specimen(s) / Culture(s) sent or taken to Lab for analysis []  - 0 Patient Transfer (multiple staff / Civil Service fast streamer / Similar devices) []  - 0 Simple Staple / Suture removal (25 or less) []  - 0 Complex Staple / Suture removal (26 or more) []  - 0 Hypo / Hyperglycemic Management (close monitor of Blood Glucose) []  - 0 Ankle / Brachial Index (ABI) - do not check if billed separately X- 1 5 Vital Signs Has the patient been seen at the hospital within the last three years: Yes Total Score: 80 Level Of Care: New/Established - Level 3 Electronic Signature(s) Signed: 02/06/2022 5:44:44 PM By: Adline Peals Entered By: Adline Peals on 02/06/2022 12:43:57 -------------------------------------------------------------------------------- Encounter Discharge Information Details Patient Name: Date of Service: Chad [name] [facility] 02/06/2022 9:15 A M Medical Record Number: [id number] Patient Account Number: [id number] Date of Birth/Sex: Treating RN: 08-29-1966 (56 y.o. [name] Primary Care Chad [name]: PCP, NO Other  Clinician: Referring Chad [name]: Treating [name]/Extender: Darliss [name] [name]: 10 Encounter Discharge Information Items Discharge Condition: Stable Ambulatory Status: Ambulatory Discharge Destination: Home Transportation: Private Auto Accompanied By: self Schedule Follow-up Appointment: Yes Clinical Summary of Care: Patient Declined Electronic Signature(s) Signed: 02/06/2022 5:44:44 PM By: Sabas Sous By: Adline Peals on 02/06/2022 10:44:40 -------------------------------------------------------------------------------- Lower Extremity Assessment Details Patient Name: Date of Service: Chad [name] [facility] 02/06/2022 9:15 A M Medical Record Number: [id number] Patient Account Number: [id number] Date of Birth/Sex: Treating RN: [birth year] (56 y.o. [name] Primary Care Amie Cowens: PCP, NO Other Clinician: Referring Susie Ehresman: Treating Maylen Waltermire/Extender: Darliss [name] [name]: 10 Edema Assessment Assessed: [Left: No] [Right: No] Edema: [Left: Yes] [Right: Yes] Calf Left: Right: Point of Measurement: 40 cm From Medial Instep 55.5 cm 58.5 cm Ankle Left: Right: Point of Measurement: 10 cm From Medial Instep 26 cm 27.5 cm Vascular Assessment Pulses: Dorsalis Pedis Palpable: [Left:Yes] [Right:Yes] Electronic Signature(s) Signed: 02/06/2022 5:44:44 PM By: Adline Peals Entered By: Adline Peals on 02/06/2022 10:11:38 -------------------------------------------------------------------------------- Multi Wound Chart Details Patient Name: Date of Service: Chad [name] [facility] 02/06/2022 9:15 A M Medical Record Number: [id number] Patient Account Number: [id number] Date of Birth/Sex: Treating RN: November 17, 1965 (56 y.o. [name] Primary Care Yasmin Bronaugh: PCP, NO Other Clinician: Referring Jeyla Bulger: Treating Racer Quam/Extender: Darliss [name] [name]:  10 Vital Signs Height(in): 76 Pulse(bpm): 23 Weight(lbs): 400 Blood Pressure(mmHg): 180/104 Body Mass Index(BMI): 48.7 Temperature(F): 98.1 Respiratory Rate(breaths/min): 20 Photos: Left, Circumferential Lower Leg Right, Anterior Lower Leg Right, Lateral Lower Leg Wound Location: Gradually Appeared Gradually Appeared Gradually Appeared Wounding Event: Lymphedema Lymphedema Lymphedema Primary Etiology: Hypertension, Peripheral Venous Hypertension, Peripheral Venous Hypertension, Peripheral Venous Comorbid History: Disease, Osteoarthritis Disease, Osteoarthritis Disease, Osteoarthritis 12/25/2021 12/25/2021 12/25/2021 Date Acquired: 4 4 4  Weeks of Treatment:  Open Open Open Wound Status: No No No Wound Recurrence: No No Yes Clustered Wound: 0x0x0 0x0x0 0x0x0 Measurements L x W x D (cm) 0 0 0 A (cm) : rea 0 0 0 Volume (cm) : 100.00% 100.00% 100.00% % Reduction in Area: 100.00% 100.00% 100.00% % Reduction in Volume: Full Thickness Without Exposed Full Thickness Without Exposed Full Thickness Without Exposed Classification: Support Structures Support Structures Support Structures None Present None Present None Present Exudate Amount: Flat and Intact Distinct, outline attached Flat and Intact Wound Margin: None Present (0%) None Present (0%) None Present (0%) Granulation Amount: None Present (0%) None Present (0%) None Present (0%) Necrotic Amount: Fascia: No Fascia: No Fascia: No Exposed Structures: Fat Layer (Subcutaneous Tissue): No Fat Layer (Subcutaneous Tissue): No Fat Layer (Subcutaneous Tissue): No Tendon: No Tendon: No Tendon: No Muscle: No Muscle: No Muscle: No Joint: No Joint: No Joint: No Bone: No Bone: No Bone: No Large (67-100%) Large (67-100%) Large (67-100%) Epithelialization: Treatment Notes Electronic Signature(s) Signed: 02/06/2022 10:36:01 AM By: Fredirick Maudlin MD FACS Signed: 02/06/2022 5:44:44 PM By: Adline Peals Entered By: Fredirick Maudlin on 02/06/2022 10:36:00 -------------------------------------------------------------------------------- Multi-Disciplinary Care Plan Details Patient Name: Date of Service: Chad [name] [facility] 02/06/2022 9:15 A M Medical Record Number: [id number] Patient Account Number: [id number] Date of Birth/Sex: Treating RN: 02/12/1966 (56 y.o. [name] Primary Care Everado Pillsbury: PCP, NO Other Clinician: Referring Tyronica Truxillo: Treating Jillienne Egner/Extender: Darliss [name] [name]: [place] reviewed with physician Active Inactive Electronic Signature(s) Signed: 02/06/2022 5:44:44 PM By: Sabas Sous By: Adline Peals on 02/06/2022 17:20:40 -------------------------------------------------------------------------------- Pain Assessment Details Patient Name: Date of Service: Chad [name] [facility] 02/06/2022 9:15 A M Medical Record Number: [id number] Patient Account Number: [id number] Date of Birth/Sex: Treating RN: May 09, 1966 (56 y.o. [name] Primary Care Chriss Mannan: PCP, NO Other Clinician: Referring Vinay Ertl: Treating Evo Aderman/Extender: Darliss [name] [name]: 10 Active Problems Location of Pain Severity and Description of Pain Patient Has Paino No Site Locations Rate the pain. Current Pain Level: 0 Pain Management and Medication Current Pain Management: Electronic Signature(s) Signed: 02/06/2022 5:44:44 PM By: Adline Peals Entered By: Adline Peals on 02/06/2022 10:07:41 -------------------------------------------------------------------------------- Patient/Caregiver Education Details Patient Name: Date of Service: Chad [name] [name] 5/23/2023andnbsp9:15 [place] Record Number: [id number] Patient Account Number: [id number] Date of Birth/Gender: Treating RN: [birth year] (56 y.o. [name] Primary Care Physician: PCP,  NO Other Clinician: Referring Physician: Treating Physician/Extender: Darliss [name] [name]: 10 Education Assessment Education Provided To: Patient Education Topics Provided Wound/Skin Impairment: Methods: Explain/Verbal Responses: Reinforcements needed, State content correctly Electronic Signature(s) Signed: 02/06/2022 5:44:44 PM By: Adline Peals Entered By: Adline Peals on 02/06/2022 10:07:19 -------------------------------------------------------------------------------- Wound Assessment Details Patient Name: Date of Service: Chad [name] [facility] 02/06/2022 9:15 A M Medical Record Number: [id number] Patient Account Number: [id number] Date of Birth/Sex: Treating RN: 1966-07-13 (56 y.o. [name] Primary Care Quinten Allerton: PCP, NO Other Clinician: Referring Carrin Vannostrand: Treating Loral Campi/Extender: Darliss [name] [name]: 10 Wound Status Wound Number: [id number] Primary Etiology: Lymphedema Wound Location: Left, Circumferential Lower Leg Wound Status: Open Wounding Event: Gradually Appeared Comorbid History: Hypertension, Peripheral Venous Disease, Osteoarthritis Date Acquired: 12/25/2021 Weeks Of Treatment: 4 Clustered Wound: No Photos Wound Measurements Length: (cm) Width: (cm) Depth: (cm) Area: (cm) Volume: (cm) 0 % Reduction in Area: 100% 0 % Reduction in Volume: 100% 0 Epithelialization: Large (67-100%) 0 Tunneling: No 0 Undermining: No Wound Description Classification:  Full Thickness Without Exposed Support Structures Wound Margin: Flat and Intact Exudate Amount: None Present Foul Odor After Cleansing: No Slough/Fibrino No Wound Bed Granulation Amount: None Present (0%) Exposed Structure Necrotic Amount: None Present (0%) Fascia Exposed: No Fat Layer (Subcutaneous Tissue) Exposed: No Tendon Exposed: No Muscle Exposed: No Joint Exposed: No Bone Exposed: No Electronic  Signature(s) Signed: 02/06/2022 5:44:44 PM By: Adline Peals Entered By: Adline Peals on 02/06/2022 10:15:50 -------------------------------------------------------------------------------- Wound Assessment Details Patient Name: Date of Service: Chad [name] [facility] 02/06/2022 9:15 A M Medical Record Number: [id number] Patient Account Number: [id number] Date of Birth/Sex: Treating RN: 1965/09/27 (56 y.o. [name] Primary Care Lynnsey Barbara: PCP, NO Other Clinician: Referring Karianne Nogueira: Treating Tascha Casares/Extender: Darliss [name] [name]: 10 Wound Status Wound Number: 17 Primary Etiology: Lymphedema Wound Location: Right, Anterior Lower Leg Wound Status: Open Wounding Event: Gradually Appeared Comorbid History: Hypertension, Peripheral Venous Disease, Osteoarthritis Date Acquired: 12/25/2021 Weeks Of Treatment: 4 Clustered Wound: No Photos Wound Measurements Length: (cm) Width: (cm) Depth: (cm) Area: (cm) Volume: (cm) 0 % Reduction in Area: 100% 0 % Reduction in Volume: 100% 0 Epithelialization: Large (67-100%) 0 Tunneling: No 0 Undermining: No Wound Description Classification: Full Thickness Without Exposed Support Structures Wound Margin: Distinct, outline attached Exudate Amount: None Present Foul Odor After Cleansing: No Slough/Fibrino No Wound Bed Granulation Amount: None Present (0%) Exposed Structure Necrotic Amount: None Present (0%) Fascia Exposed: No Fat Layer (Subcutaneous Tissue) Exposed: No Tendon Exposed: No Muscle Exposed: No Joint Exposed: No Bone Exposed: No Electronic Signature(s) Signed: 02/06/2022 5:44:44 PM By: Adline Peals Entered By: Adline Peals on 02/06/2022 10:16:27 -------------------------------------------------------------------------------- Wound Assessment Details Patient Name: Date of Service: Chad [name] [facility] 02/06/2022 9:15 A M Medical Record Number: [id number] Patient  Account Number: [id number] Date of Birth/Sex: Treating RN: [birth year] (56 y.o. [name] Primary Care Hawk Mones: PCP, NO Other Clinician: Referring Elston Aldape: Treating Helmuth Recupero/Extender: Darliss [name] [name]: 10 Wound Status Wound Number: 18 Primary Etiology: Lymphedema Wound Location: Right, Lateral Lower Leg Wound Status: Open Wounding Event: Gradually Appeared Notes: cluster of 3 Date Acquired: 12/25/2021 Comorbid History: Hypertension, Peripheral Venous Disease, Osteoarthritis Weeks Of Treatment: 4 Clustered Wound: Yes Photos Wound Measurements Length: (cm) Width: (cm) Depth: (cm) Area: (cm) Volume: (cm) 0 % Reduction in Area: 100% 0 % Reduction in Volume: 100% 0 Epithelialization: Large (67-100%) 0 Tunneling: No 0 Undermining: No Wound Description Classification: Full Thickness Without Exposed Support Structures Wound Margin: Flat and Intact Exudate Amount: None Present Foul Odor After Cleansing: No Slough/Fibrino No Wound Bed Granulation Amount: None Present (0%) Exposed Structure Necrotic Amount: None Present (0%) Fascia Exposed: No Fat Layer (Subcutaneous Tissue) Exposed: No Tendon Exposed: No Muscle Exposed: No Joint Exposed: No Bone Exposed: No Electronic Signature(s) Signed: 02/06/2022 5:44:44 PM By: Adline Peals Entered By: Adline Peals on 02/06/2022 10:16:50 -------------------------------------------------------------------------------- Vitals Details Patient Name: Date of Service: Chad [name] [facility] 02/06/2022 9:15 A M Medical Record Number: [id number] Patient Account Number: [id number] Date of Birth/Sex: Treating RN: 1966-05-22 (56 y.o. [name] Primary Care Elizet Kaplan: PCP, NO Other Clinician: Referring Apolonio Cutting: Treating Camika Marsico/Extender: Darliss [name] [name]: 10 Vital Signs Time Taken: 10:08 Temperature (F): 98.1 Height (in): 76 Pulse (bpm):  87 Weight (lbs): 400 Respiratory Rate (breaths/min): 20 Body Mass Index (BMI): 48.7 Blood Pressure (mmHg): 180/104 Reference Range: 80 - 120 mg / dl Electronic Signature(s) Signed: 02/06/2022 5:44:44 PM By: Adline Peals Entered By: Adline Peals on 02/06/2022 10:09:48

## 2022-02-27 ENCOUNTER — Ambulatory Visit (INDEPENDENT_AMBULATORY_CARE_PROVIDER_SITE_OTHER): Payer: Medicare HMO | Admitting: Podiatry

## 2022-02-27 DIAGNOSIS — B351 Tinea unguium: Secondary | ICD-10-CM | POA: Diagnosis not present

## 2022-02-27 DIAGNOSIS — Z79899 Other long term (current) drug therapy: Secondary | ICD-10-CM

## 2022-02-27 DIAGNOSIS — M79675 Pain in left toe(s): Secondary | ICD-10-CM | POA: Diagnosis not present

## 2022-02-27 DIAGNOSIS — M79674 Pain in right toe(s): Secondary | ICD-10-CM | POA: Diagnosis not present

## 2022-02-27 NOTE — Patient Instructions (Signed)
Terbinafine Tablets What is this medication? TERBINAFINE (TER bin a feen) treats fungal infections of the nails. It belongs to a group of medications called antifungals. It will not treat infections caused by bacteria or viruses. This medicine may be used for other purposes; ask your health care provider or pharmacist if you have questions. COMMON BRAND NAME(S): Lamisil, Terbinex What should I tell my care team before I take this medication? They need to know if you have any of these conditions: Liver disease An unusual or allergic reaction to terbinafine, other medications, foods, dyes, or preservatives Pregnant or trying to get pregnant Breast-feeding How should I use this medication? Take this medication by mouth with water. Take it as directed on the prescription label at the same time every day. You can take it with or without food. If it upsets your stomach, take it with food. Keep taking it unless your care team tells you to stop. A special MedGuide will be given to you by the pharmacist with each prescription and refill. Be sure to read this information carefully each time. Talk to your care team regarding the use of this medication in children. Special care may be needed. Overdosage: If you think you have taken too much of this medicine contact a poison control center or emergency room at once. NOTE: This medicine is only for you. Do not share this medicine with others. What if I miss a dose? If you miss a dose, take it as soon as you can unless it is more than 4 hours late. If it is more than 4 hours late, skip the missed dose. Take the next dose at the normal time. What may interact with this medication? Do not take this medication with any of the following: Pimozide Thioridazine This medication may also interact with the following: Beta blockers Caffeine Certain medications for mental health conditions Cimetidine Cyclosporine Medications for fungal infections like fluconazole  and ketoconazole Medications for irregular heartbeat like amiodarone, flecainide and propafenone Rifampin Warfarin This list may not describe all possible interactions. Give your health care provider a list of all the medicines, herbs, non-prescription drugs, or dietary supplements you use. Also tell them if you smoke, drink alcohol, or use illegal drugs. Some items may interact with your medicine. What should I watch for while using this medication? Visit your care team for regular checks on your progress. You may need blood work while you are taking this medication. It may be some time before you see the benefit from this medication. This medication may cause serious skin reactions. They can happen weeks to months after starting the medication. Contact your care team right away if you notice fevers or flu-like symptoms with a rash. The rash may be red or purple and then turn into blisters or peeling of the skin. Or, you might notice a red rash with swelling of the face, lips or lymph nodes in your neck or under your arms. This medication can make you more sensitive to the sun. Keep out of the sun, If you cannot avoid being in the sun, wear protective clothing and sunscreen. Do not use sun lamps or tanning beds/booths. What side effects may I notice from receiving this medication? Side effects that you should report to your care team as soon as possible: Allergic reactions--skin rash, itching, hives, swelling of the face, lips, tongue, or throat Change in sense of smell Change in taste Infection--fever, chills, cough, or sore throat Liver injury--right upper belly pain, loss of appetite, nausea,   light-colored stool, dark yellow or brown urine, yellowing skin or eyes, unusual weakness or fatigue Low red blood cell level--unusual weakness or fatigue, dizziness, headache, trouble breathing Lupus-like syndrome--joint pain, swelling, or stiffness, butterfly-shaped rash on the face, rashes that get worse  in the sun, fever, unusual weakness or fatigue Rash, fever, and swollen lymph nodes Redness, blistering, peeling, or loosening of the skin, including inside the mouth Unusual bruising or bleeding Worsening mood, feelings of depression Side effects that usually do not require medical attention (report to your care team if they continue or are bothersome): Diarrhea Gas Headache Nausea Stomach pain Upset stomach This list may not describe all possible side effects. Call your doctor for medical advice about side effects. You may report side effects to FDA at 1-800-FDA-1088. Where should I keep my medication? Keep out of the reach of children and pets. Store between 20 and 25 degrees C (68 and 77 degrees F). Protect from light. Get rid of any unused medication after the expiration date. To get rid of medications that are no longer needed or have expired: Take the medication to a medication take-back program. Check with your pharmacy or law enforcement to find a location. If you cannot return the medication, check the label or package insert to see if the medication should be thrown out in the garbage or flushed down the toilet. If you are not sure, ask your care team. If it is safe to put it in the trash, take the medication out of the container. Mix the medication with cat litter, dirt, coffee grounds, or other unwanted substance. Seal the mixture in a bag or container. Put it in the trash. NOTE: This sheet is a summary. It may not cover all possible information. If you have questions about this medicine, talk to your doctor, pharmacist, or health care provider.  2023 [employer] Standard (2021-04-19 00:00:00)  

## 2022-02-27 NOTE — Progress Notes (Signed)
[name], [name] ([id number]) Visit Report for 01/15/2022 Arrival Information Details Patient Name: Date of Service: Chad [name] [facility] 01/15/2022 11:00 A M Medical Record Number: [id number] Patient Account Number: [id number] Date of Birth/Sex: Treating RN: 08-29-1966 (56 y.o. Chad [name] Primary Care Lavontay Kirk: PCP, NO Other Clinician: Referring [name]: Treating [name]/Extender: [name] [name]: 7 Visit Information History Since Last Visit Added or deleted any medications: No Patient Arrived: Ambulatory Any new allergies or adverse reactions: No Arrival Time: 11:07 Had a fall or experienced change in No Accompanied By: self activities of daily living that may affect Transfer Assistance: None risk of falls: Patient Requires Transmission-Based Precautions: No Signs or symptoms of abuse/neglect since last visito No Patient Has Alerts: Yes Hospitalized since last visit: No Patient Alerts: ABI non compressible bil Implantable device outside of the clinic excluding No cellular tissue based products placed in the center since last visit: Has Dressing in Place as Prescribed: Yes Pain Present Now: No Electronic Signature(s) Signed: 02/27/2022 8:46:54 AM By: Erenest Blank Entered By: Erenest Blank on 01/15/2022 11:13:12 -------------------------------------------------------------------------------- Compression Therapy Details Patient Name: Date of Service: Chad [name] [facility] 01/15/2022 11:00 [place] Record Number: [id number] Patient Account Number: [id number] Date of Birth/Sex: Treating RN: Nov 24, 1965 (56 y.o. Chad [name] Primary Care [name]: PCP, NO Other Clinician: Referring [name]: Treating [name]/Extender: [name] [name]: 7 Compression Therapy Performed for Wound Assessment: Wound #15 Left,Circumferential Lower Leg Performed By: Clinician [name], RN Compression Type: Four  Layer Post Procedure Diagnosis Same as Pre-procedure Electronic Signature(s) Signed: 01/15/2022 5:35:53 PM By: [name] RN Entered By: [name] on 01/15/2022 16:55:46 -------------------------------------------------------------------------------- Compression Therapy Details Patient Name: Date of Service: Chad [name] [facility] 01/15/2022 11:00 A M Medical Record Number: [id number] Patient Account Number: [id number] Date of Birth/Sex: Treating RN: 24-May-1966 (56 y.o. Chad [name] Primary Care [name]: PCP, NO Other Clinician: Referring [name]: Treating [name]/Extender: [name] [name]: 7 Compression Therapy Performed for Wound Assessment: Wound #16 Right,Medial Lower Leg Performed By: Clinician [name], RN Compression Type: Four Layer Post Procedure Diagnosis Same as Pre-procedure Electronic Signature(s) Signed: 01/15/2022 5:35:53 PM By: [name] RN Entered By: [name] on 01/15/2022 16:55:46 -------------------------------------------------------------------------------- Compression Therapy Details Patient Name: Date of Service: Chad [name] [facility] 01/15/2022 11:00 A M Medical Record Number: [id number] Patient Account Number: [id number] Date of Birth/Sex: Treating RN: 02/02/1966 (56 y.o. Chad [name] Primary Care [name]: PCP, NO Other Clinician: Referring [name]: Treating [name]/Extender: [name] [name]: 7 Compression Therapy Performed for Wound Assessment: Wound #17 Right,Anterior Lower Leg Performed By: Clinician [name], RN Compression Type: Four Layer Post Procedure Diagnosis Same as Pre-procedure Electronic Signature(s) Signed: 01/15/2022 5:35:53 PM By: [name] RN Entered By: [name] on 01/15/2022 16:55:46 -------------------------------------------------------------------------------- Compression Therapy Details Patient  Name: Date of Service: Chad [name] [facility] 01/15/2022 11:00 A M Medical Record Number: [id number] Patient Account Number: [id number] Date of Birth/Sex: Treating RN: [birth year] (56 y.o. Chad [name] Primary Care [name]: PCP, NO Other Clinician: Referring [name]: Treating [name]/Extender: [name] [name]: 7 Compression Therapy Performed for Wound Assessment: Wound #18 Right,Lateral Lower Leg Performed By: Clinician [name], RN Compression Type: Four Layer Post Procedure Diagnosis Same as Pre-procedure Electronic Signature(s) Signed: 01/15/2022 5:35:53 PM By: [name] RN Signed: 01/15/2022 5:35:53 PM By: [name] RN Entered By: [name] on 01/15/2022 16:55:46 -------------------------------------------------------------------------------- Encounter Discharge Information  Details Patient Name: [name] 11:00 A M Medical Record Number: [id number] Patient Account Number: [id number] Date of Birth/Sex: Treating RN: 01/21/1966 (56 y.o. [name]) Scotton, Joanne Primary Care [name]: PCP, NO Other Clinician: Referring [name]: Treating [name]/Extender: [name], [name], [name] [name]: 7 Encounter Discharge Information Items Discharge Condition: Stable Ambulatory Status: Ambulatory Discharge Destination: Home Transportation: Private Auto Accompanied By: self Schedule Follow-up Appointment: Yes Clinical Summary of Care: Patient Declined Electronic Signature(s) Signed: 01/15/2022 5:35:53 PM By: Karie SchwalbeScotton, Joanne RN Entered By: Karie SchwalbeScotton, Joanne on 01/15/2022 17:00:36 -------------------------------------------------------------------------------- Lower Extremity Assessment Details Patient Name: [name] 11:00 A M Medical Record Number: [id number] Patient Account Number: [id number] Date of Birth/Sex: Treating RN: 03/16/1966 (56 y.o. [name])  Scotton, Joanne Primary Care [place] Perra: PCP, NO Other Clinician: Referring Ailyn Gladd: Treating Yussuf Sawyers/Extender: [name], [name], [name] [name]: 7 Edema Assessment Assessed: [Left: No] [Right: No] Edema: [Left: Yes] [Right: Yes] Calf Left: Right: Point of Measurement: 40 cm From Medial Instep 55.6 cm 54.5 cm Ankle Left: Right: Point of Measurement: 10 cm From Medial Instep 28.6 cm 27.1 cm Vascular Assessment Pulses: Dorsalis Pedis Palpable: [Left:Yes] [Right:Yes] Electronic Signature(s) Signed: 01/15/2022 5:35:53 PM By: Karie SchwalbeScotton, Joanne RN Signed: 02/27/2022 8:46:54 AM By: Thayer Dallasick, Kimberly Entered By: Thayer Dallasick, Kimberly on 01/15/2022 11:35:21 -------------------------------------------------------------------------------- Multi Wound Chart Details Patient Name: [name]15[facility] 11:00 A M Medical Record Number: [id number] Patient Account Number: [id number] Date of Birth/Sex: Treating RN: 06/02/1966 (56 y.o. [name]) Scotton, Joanne Primary Care Lakeya Mulka: PCP, NO Other Clinician: Referring Rayansh Herbst: Treating Ziyanna Tolin/Extender: [name], [name], [name] [name]: 7 Vital Signs Height(in): 76 Pulse(bpm): 98 Weight(lbs): 400 Blood Pressure(mmHg): 182/118 Body Mass Index(BMI): 48.7 Temperature(F): 98.6 Respiratory Rate(breaths/min): 19 Photos: Circumferential Lower Leg Right, Medial Lower Leg Right, Anterior Lower Leg Wound Location: Gradually Appeared Gradually Appeared Gradually Appeared Wounding Event: Lymphedema Lymphedema Lymphedema Primary Etiology: Hypertension, Peripheral Venous Hypertension, Peripheral Venous Hypertension, Peripheral Venous Comorbid History: Disease, Osteoarthritis Disease, Osteoarthritis Disease, Osteoarthritis 12/25/2021 12/25/2021 12/25/2021 Date Acquired: 1 1 1  Weeks of Treatment: Open Open Open Wound Status: No No No Wound Recurrence: No No No Clustered Wound: 12x7x0.1 0.1x0.1x0.1  1.7x1.2x0.1 Measurements L x W x D (cm) 65.973 0.008 1.602 A (cm) : rea 6.597 0.001 0.16 Volume (cm) : 88.30% 97.70% 54.70% % Reduction in Area: 88.30% 97.10% 54.70% % Reduction in Volume: Full Thickness Without Exposed Full Thickness Without Exposed Full Thickness Without Exposed Classification: Support Structures Support Structures Support Structures Large Medium Medium Exudate Amount: Serosanguineous Serosanguineous Serosanguineous Exudate Type: red, brown red, brown red, brown Exudate Color: Flat and Intact Flat and Intact Distinct, outline attached Wound Margin: Large (67-100%) Large (67-100%) Large (67-100%) Granulation Amount: Red, Pink Red, Hyper-granulation, Friable Red Granulation Quality: None Present (0%) None Present (0%) Small (1-33%) Necrotic Amount: Fat Layer (Subcutaneous Tissue): Yes Fat Layer (Subcutaneous Tissue): Yes Fat Layer (Subcutaneous Tissue): Yes Exposed Structures: Fascia: No Fascia: No Fascia: No Tendon: No Tendon: No Tendon: No Muscle: No Muscle: No Muscle: No Joint: No Joint: No Joint: No Bone: No Bone: No Bone: No Large (67-100%) Large (67-100%) Small (1-33%) Epithelialization: Wound Number: 18 N/A N/A Photos: N/A N/A Right, Lateral Lower Leg N/A N/A Wound Location: Gradually Appeared N/A N/A Wounding Event: Lymphedema N/A N/A Primary Etiology: Hypertension, Peripheral Venous N/A N/A Comorbid History: Disease, Osteoarthritis 12/25/2021 N/A N/A Date Acquired: 1 N/A N/A Weeks of Treatment: Open N/A N/A Wound Status: No N/A N/A Wound  Recurrence: Yes N/A N/A Clustered Wound: 1.6x1.2x0.1 N/A N/A Measurements L x W x D (cm) 1.508 N/A N/A A (cm) : rea 0.151 N/A N/A Volume (cm) : 57.30% N/A N/A % Reduction in Area: 57.20% N/A N/A % Reduction in Volume: Full Thickness Without Exposed N/A N/A Classification: Support Structures Medium N/A N/A Exudate Amount: Serosanguineous N/A N/A Exudate Type: red, brown  N/A N/A Exudate Color: Flat and Intact N/A N/A Wound Margin: Large (67-100%) N/A N/A Granulation Amount: Red, Hyper-granulation, Friable N/A N/A Granulation Quality: None Present (0%) N/A N/A Necrotic Amount: Fat Layer (Subcutaneous Tissue): Yes N/A N/A Exposed Structures: Fascia: No Tendon: No Muscle: No Joint: No Bone: No Small (1-33%) N/A N/A Epithelialization: Treatment Notes Electronic Signature(s) Signed: 01/15/2022 11:49:46 AM By: Fredirick Maudlin MD FACS Signed: 01/15/2022 5:35:53 PM By: [name] RN Entered By: Fredirick Maudlin on 01/15/2022 11:49:46 -------------------------------------------------------------------------------- Multi-Disciplinary Care Plan Details Patient Name: Date of Service: Chad [name] [facility] 01/15/2022 11:00 A M Medical Record Number: [id number] Patient Account Number: [id number] Date of Birth/Sex: Treating RN: May 18, 1966 (56 y.o. Chad [name] Primary Care Eletha Culbertson: PCP, NO Other Clinician: Referring Taliana Mersereau: Treating Abdalla Naramore/Extender: [name] [name]: 7 Multidisciplinary Care Plan reviewed with physician Active Inactive Pain, Acute or Chronic Nursing Diagnoses: Pain, acute or chronic: actual or potential Goals: Patient/caregiver will verbalize adequate pain control between visits Date Initiated: 01/08/2022 Target Resolution Date: 02/09/2022 Goal Status: Active Interventions: Complete pain assessment as per visit requirements Notes: Electronic Signature(s) Signed: 01/15/2022 5:35:53 PM By: [name] RN Signed: 01/15/2022 5:35:53 PM By: [name] RN Entered By: [name] on 01/15/2022 16:59:06 -------------------------------------------------------------------------------- Pain Assessment Details Patient Name: Date of Service: Chad [name] [facility] 01/15/2022 11:00 A M Medical Record Number: [id number] Patient Account Number: [id number] Date of Birth/Sex: Treating  RN: 12-09-1965 (56 y.o. Chad [name] Primary Care Jakyla Reza: PCP, NO Other Clinician: Referring Nakeita Styles: Treating Siham Bucaro/Extender: [name] [name]: 7 Active Problems Location of Pain Severity and Description of Pain Patient Has Paino No Site Locations Pain Management and Medication Current Pain Management: Electronic Signature(s) Signed: 01/15/2022 5:35:53 PM By: [name] RN Signed: 02/27/2022 8:46:54 AM By: Erenest Blank Entered By: Erenest Blank on 01/15/2022 11:14:08 -------------------------------------------------------------------------------- Patient/Caregiver Education Details Patient Name: Date of Service: Chad [name] [name] 5/1/2023andnbsp11:00 A M Medical Record Number: [id number] Patient Account Number: [id number] Date of Birth/Gender: Treating RN: 1966/03/08 (56 y.o. Chad [name] Primary Care Physician: PCP, NO Other Clinician: Referring Physician: Treating Physician/Extender: [place]ia Phlegm in Treatment: 7 Education Assessment Education Provided To: Patient Education Topics Provided Wound/Skin Impairment: Methods: Explain/Verbal Responses: Return demonstration correctly Electronic Signature(s) Signed: 01/15/2022 5:35:53 PM By: [name] RN Entered By: [name] on 01/15/2022 16:59:19 -------------------------------------------------------------------------------- Wound Assessment Details Patient Name: Date of Service: Chad [name] [facility] 01/15/2022 11:00 [place] Record Number: [id number] Patient Account Number: [id number] Date of Birth/Sex: Treating RN: 1965-12-18 (56 y.o. Chad [name] Primary Care Arleen Bar: PCP, NO Other Clinician: Referring Starletta Houchin: Treating Bronnie Vasseur/Extender: [name] [name]: 7 Wound Status Wound Number: 15 Primary Etiology: Lymphedema Wound Location: Left, Circumferential Lower Leg Wound Status:  Open Wounding Event: Gradually Appeared Comorbid History: Hypertension, Peripheral Venous Disease, Osteoarthritis Date Acquired: 12/25/2021 Weeks Of Treatment: 1 Clustered Wound: No Photos Wound Measurements Length: (cm) 12 Width: (cm) 7 Depth: (cm) 0.1 Area: (cm) 65.973 Volume: (cm) 6.597 % Reduction in Area: 88.3% % Reduction in Volume: 88.3% Epithelialization: Large (67-100%) Tunneling: No  Undermining: No Wound Description Classification: Full Thickness Without Exposed Support Structures Wound Margin: Flat and Intact Exudate Amount: Large Exudate Type: Serosanguineous Exudate Color: red, brown Foul Odor After Cleansing: No Slough/Fibrino Yes Wound Bed Granulation Amount: Large (67-100%) Exposed Structure Granulation Quality: Red, Pink Fascia Exposed: No Necrotic Amount: None Present (0%) Fat Layer (Subcutaneous Tissue) Exposed: Yes Tendon Exposed: No Muscle Exposed: No Joint Exposed: No Bone Exposed: No Electronic Signature(s) Signed: 01/15/2022 5:35:53 PM By: [name] RN Entered By: [name] on 01/15/2022 16:54:45 -------------------------------------------------------------------------------- Wound Assessment Details Patient Name: Date of Service: Chad [name] [facility] 01/15/2022 11:00 A M Medical Record Number: [id number] Patient Account Number: [id number] Date of Birth/Sex: Treating RN: September 24, 1965 (56 y.o. Chad [name] Primary Care Nikolette Reindl: PCP, NO Other Clinician: Referring Ladasha Schnackenberg: Treating Yaden Seith/Extender: [name] [name]: 7 Wound Status Wound Number: 16 Primary Etiology: Lymphedema Wound Location: Right, Medial Lower Leg Wound Status: Open Wounding Event: Gradually Appeared Comorbid History: Hypertension, Peripheral Venous Disease, Osteoarthritis Date Acquired: 12/25/2021 Weeks Of Treatment: 1 Clustered Wound: No Photos Wound Measurements Length: (cm) 0.1 Width: (cm) 0.1 Depth: (cm)  0.1 Area: (cm) 0.008 Volume: (cm) 0.001 % Reduction in Area: 97.7% % Reduction in Volume: 97.1% Epithelialization: Large (67-100%) Tunneling: No Undermining: No Wound Description Classification: Full Thickness Without Exposed Support Structures Wound Margin: Flat and Intact Exudate Amount: Medium Exudate Type: Serosanguineous Exudate Color: red, brown Foul Odor After Cleansing: No Slough/Fibrino No Wound Bed Granulation Amount: Large (67-100%) Exposed Structure Granulation Quality: Red, Hyper-granulation, Friable Fascia Exposed: No Necrotic Amount: None Present (0%) Fat Layer (Subcutaneous Tissue) Exposed: Yes Tendon Exposed: No Muscle Exposed: No Joint Exposed: No Bone Exposed: No Electronic Signature(s) Signed: 01/15/2022 5:35:53 PM By: [name] RN Entered By: [name] on 01/15/2022 11:43:24 -------------------------------------------------------------------------------- Wound Assessment Details Patient Name: Date of Service: Chad [name] [facility] 01/15/2022 11:00 A M Medical Record Number: [id number] Patient Account Number: [id number] Date of Birth/Sex: Treating RN: [birth year] (56 y.o. Chad [name] Primary Care Tarvares Lant: PCP, NO Other Clinician: Referring Allye Hoyos: Treating Dianna Ewald/Extender: [name] [name]: 7 Wound Status Wound Number: 17 Primary Etiology: Lymphedema Wound Location: Right, Anterior Lower Leg Wound Status: Open Wounding Event: Gradually Appeared Comorbid History: Hypertension, Peripheral Venous Disease, Osteoarthritis Date Acquired: 12/25/2021 Weeks Of Treatment: 1 Clustered Wound: No Photos Wound Measurements Length: (cm) 1.7 Width: (cm) 1.2 Depth: (cm) 0.1 Area: (cm) 1.602 Volume: (cm) 0.16 % Reduction in Area: 54.7% % Reduction in Volume: 54.7% Epithelialization: Small (1-33%) Tunneling: No Undermining: No Wound Description Classification: Full Thickness Without Exposed Support  Structures Wound Margin: Distinct, outline attached Exudate Amount: Medium Exudate Type: Serosanguineous Exudate Color: red, brown Foul Odor After Cleansing: No Slough/Fibrino Yes Wound Bed Granulation Amount: Large (67-100%) Exposed Structure Granulation Quality: Red Fascia Exposed: No Necrotic Amount: Small (1-33%) Fat Layer (Subcutaneous Tissue) Exposed: Yes Necrotic Quality: Adherent Slough Tendon Exposed: No Muscle Exposed: No Joint Exposed: No Bone Exposed: No Electronic Signature(s) Signed: 01/15/2022 5:35:53 PM By: [name] RN Entered By: [name] on 01/15/2022 11:43:39 -------------------------------------------------------------------------------- Wound Assessment Details Patient Name: Date of Service: Chad [name] [facility] 01/15/2022 11:00 A M Medical Record Number: [id number] Patient Account Number: [id number] Date of Birth/Sex: Treating RN: [birth year] (56 y.o. Chad [name] Primary Care Mancil Pfenning: PCP, NO Other Clinician: Referring Andi Mahaffy: Treating Kaylor Maiers/Extender: [name] [name]: 7 Wound Status Wound Number: 18 Primary Etiology: Lymphedema Wound Location: Right, Lateral Lower Leg Wound Status: Open Wounding Event: Gradually Appeared  Notes: cluster of 3 Date Acquired: 12/25/2021 Comorbid History: Hypertension, Peripheral Venous Disease, Osteoarthritis Weeks Of Treatment: 1 Clustered Wound: Yes Photos Wound Measurements Length: (cm) 1.6 Width: (cm) 1.2 Depth: (cm) 0.1 Area: (cm) 1.508 Volume: (cm) 0.151 % Reduction in Area: 57.3% % Reduction in Volume: 57.2% Epithelialization: Small (1-33%) Tunneling: No Undermining: No Wound Description Classification: Full Thickness Without Exposed Support Structures Wound Margin: Flat and Intact Exudate Amount: Medium Exudate Type: Serosanguineous Exudate Color: red, brown Foul Odor After Cleansing: No Slough/Fibrino Yes Wound Bed Granulation Amount:  Large (67-100%) Exposed Structure Granulation Quality: Red, Hyper-granulation, Friable Fascia Exposed: No Necrotic Amount: None Present (0%) Fat Layer (Subcutaneous Tissue) Exposed: Yes Tendon Exposed: No Muscle Exposed: No Joint Exposed: No Bone Exposed: No Electronic Signature(s) Signed: 01/15/2022 5:35:53 PM By: [name] RN Entered By: [name] on 01/15/2022 11:43:51 -------------------------------------------------------------------------------- Vitals Details Patient Name: Date of Service: Chad [name] [name] 01/15/2022 11:00 A M Medical Record Number: [id number] Patient Account Number: [id number] Date of Birth/Sex: Treating RN: [birth year] (56 y.o. Chad [name] Primary Care Mckell Riecke: PCP, NO Other Clinician: Referring Kiesha Ensey: Treating Najib Colmenares/Extender: [name] [name]: 7 Vital Signs Time Taken: 11:13 Temperature (F): 98.6 Height (in): 76 Pulse (bpm): 98 Weight (lbs): 400 Respiratory Rate (breaths/min): 19 Body Mass Index (BMI): 48.7 Blood Pressure (mmHg): 182/118 Reference Range: 80 - 120 mg / dl Electronic Signature(s) Signed: 02/27/2022 8:46:54 AM By: Erenest Blank Entered By: Erenest Blank on 01/15/2022 11:35:44

## 2022-03-01 ENCOUNTER — Other Ambulatory Visit: Payer: Self-pay | Admitting: Podiatry

## 2022-03-01 DIAGNOSIS — Z79899 Other long term (current) drug therapy: Secondary | ICD-10-CM

## 2022-03-01 LAB — CBC WITH DIFFERENTIAL/PLATELET
Basophils Absolute: 0 10*3/uL (ref 0.0–0.2)
Basos: 1 %
EOS (ABSOLUTE): 0.2 10*3/uL (ref 0.0–0.4)
Eos: 4 %
Hematocrit: 44 % (ref 37.5–51.0)
Hemoglobin: 14.5 g/dL (ref 13.0–17.7)
Immature Grans (Abs): 0 10*3/uL (ref 0.0–0.1)
Immature Granulocytes: 0 %
Lymphocytes Absolute: 1.5 10*3/uL (ref 0.7–3.1)
Lymphs: 25 %
MCH: 27.7 pg (ref 26.6–33.0)
MCHC: 33 g/dL (ref 31.5–35.7)
MCV: 84 fL (ref 79–97)
Monocytes Absolute: 0.6 10*3/uL (ref 0.1–0.9)
Monocytes: 10 %
Neutrophils Absolute: 3.5 10*3/uL (ref 1.4–7.0)
Neutrophils: 60 %
Platelets: 290 10*3/uL (ref 150–450)
RBC: 5.24 x10E6/uL (ref 4.14–5.80)
RDW: 15.8 % — ABNORMAL HIGH (ref 11.6–15.4)
WBC: 5.8 10*3/uL (ref 3.4–10.8)

## 2022-03-01 LAB — HEPATIC FUNCTION PANEL
ALT: 16 IU/L (ref 0–44)
AST: 17 IU/L (ref 0–40)
Albumin: 4.3 g/dL (ref 3.8–4.9)
Alkaline Phosphatase: 77 IU/L (ref 44–121)
Bilirubin Total: 0.5 mg/dL (ref 0.0–1.2)
Bilirubin, Direct: 0.15 mg/dL (ref 0.00–0.40)
Total Protein: 7.4 g/dL (ref 6.0–8.5)

## 2022-03-01 MED ORDER — TERBINAFINE HCL 250 MG PO TABS: 250.0000 mg | ORAL_TABLET | Freq: Every day | ORAL | 0 refills | Status: AC

## 2022-03-06 NOTE — Progress Notes (Signed)
Subjective:   Patient ID: Chad [name], male   DOB: 57 y.o.   MRN: [id number]   HPI 56 year old male presents the office today for concerns of thick, elongated toenails that he is not able to himself and he has noticed yellow discoloration.  He does get some pain with wearing.  Tried over-the-counter nail gel without significant improvement and this was several years ago.  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  No open lesions.  No swelling redness or any drainage.   Review of Systems  All other systems reviewed and are negative.  Past Medical History:  Diagnosis Date   Arthritis    Hypertension     No past surgical history on file.   Current Outpatient Medications:    amLODipine-benazepril (LOTREL) 10-20 MG capsule, Take 1 capsule by mouth daily., Disp: , Rfl:    benzonatate (TESSALON) 100 MG capsule, Take 1 capsule (100 mg total) by mouth every 8 (eight) hours., Disp: 21 capsule, Rfl: 0   cloNIDine (CATAPRES) 0.3 MG tablet, Take by mouth., Disp: , Rfl:    doxycycline (VIBRAMYCIN) 100 MG capsule, Take 1 capsule (100 mg total) by mouth 2 (two) times daily., Disp: 20 capsule, Rfl: 0   fluticasone (FLONASE) 50 MCG/ACT nasal spray, Place 1 spray into both nostrils daily. (Patient not taking: Reported on 01/20/2021), Disp: 9.9 g, Rfl: 2   guaiFENesin (MUCINEX) 600 MG 12 hr tablet, Take 1 tablet (600 mg total) by mouth 2 (two) times daily. (Patient not taking: Reported on 01/20/2021), Disp: 30 tablet, Rfl: 0   hydrochlorothiazide (HYDRODIURIL) 25 MG tablet, Take by mouth., Disp: , Rfl:    omeprazole (PRILOSEC) 40 MG capsule, Take 40 mg by mouth daily., Disp: , Rfl:    terbinafine (LAMISIL) 250 MG tablet, Take 1 tablet (250 mg total) by mouth daily., Disp: 90 tablet, Rfl: 0  No Known Allergies        Objective:  Physical Exam  General: AAO x3, NAD  Dermatological: Nails are hypertrophic, dystrophic, brittle, discolored, elongated 10. No surrounding  redness or drainage. Tenderness nails 1-5 bilaterally. No open lesions or pre-ulcerative lesions are identified today.  Vascular: Dorsalis Pedis artery and Posterior Tibial artery pedal pulses are palpable bilateral with immedate capillary fill time. There is no pain with calf compression, swelling, warmth, erythema.   Neruologic: Grossly intact via light touch bilateral.  Musculoskeletal: No gross boney pedal deformities bilateral. No pain, crepitus, or limitation noted with foot and ankle range of motion bilateral. Muscular strength 5/5 in all groups tested bilateral.     Assessment:   Symptomatic onychomycosis     Plan:  -Treatment options discussed including all alternatives, risks, and complications -Etiology of symptoms were discussed -Nails debrided 10 without complications or bleeding. -Discussed different treatment options for nail fungus.  Include oral, topical as well as alternative treatments.  He was proceed with oral Lamisil.  We will check a CBC and LFT.  Monitor for any side effects and if any were to occur to stop taking the medication and let me know immediately. -Daily foot inspection -Follow-up in 3 months or sooner if any problems arise. In the meantime, encouraged to call the office with any questions, concerns, change in symptoms.   Ovid Curd, DPM

## 2022-03-09 ENCOUNTER — Encounter (HOSPITAL_BASED_OUTPATIENT_CLINIC_OR_DEPARTMENT_OTHER): Payer: Medicare HMO | Attending: General Surgery | Admitting: General Surgery

## 2022-03-09 DIAGNOSIS — M1612 Unilateral primary osteoarthritis, left hip: Secondary | ICD-10-CM | POA: Diagnosis not present

## 2022-03-09 DIAGNOSIS — L97222 Non-pressure chronic ulcer of left calf with fat layer exposed: Secondary | ICD-10-CM | POA: Insufficient documentation

## 2022-03-09 DIAGNOSIS — I872 Venous insufficiency (chronic) (peripheral): Secondary | ICD-10-CM | POA: Diagnosis not present

## 2022-03-09 DIAGNOSIS — I890 Lymphedema, not elsewhere classified: Secondary | ICD-10-CM | POA: Insufficient documentation

## 2022-03-09 DIAGNOSIS — L97822 Non-pressure chronic ulcer of other part of left lower leg with fat layer exposed: Secondary | ICD-10-CM | POA: Insufficient documentation

## 2022-03-09 DIAGNOSIS — L97211 Non-pressure chronic ulcer of right calf limited to breakdown of skin: Secondary | ICD-10-CM | POA: Diagnosis present

## 2022-03-09 DIAGNOSIS — L97812 Non-pressure chronic ulcer of other part of right lower leg with fat layer exposed: Secondary | ICD-10-CM | POA: Insufficient documentation

## 2022-03-09 DIAGNOSIS — L299 Pruritus, unspecified: Secondary | ICD-10-CM | POA: Insufficient documentation

## 2022-03-16 ENCOUNTER — Encounter (HOSPITAL_BASED_OUTPATIENT_CLINIC_OR_DEPARTMENT_OTHER): Payer: Medicare HMO | Admitting: General Surgery

## 2022-03-16 DIAGNOSIS — L97211 Non-pressure chronic ulcer of right calf limited to breakdown of skin: Secondary | ICD-10-CM | POA: Diagnosis not present

## 2022-03-16 NOTE — Progress Notes (Signed)
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 complicating the situation as it stands. Fortunately I do not see any signs of infection locally and even systemically I think he is okay at this point. I believe the biggest issue which is fluid overloaded in the legs and again there may be some recommendations I gave him to try to help out in this regard going forward. 08/09/2021 upon evaluation today patient appears to be doing well with regard to his left leg although the right leg he actually started itching and use the backside of a back scratcher to try to scratch the area this caused some other small wounds unfortunately. Fortunately we have there is no signs of active infection at this time. No fevers, chills, nausea, vomiting, or diarrhea. 08/16/2021 upon evaluation today patient  appears to be doing well with regard to his legs. He is not completely healed but he does seem to be doing much better there are couple small blisters that opened on the right leg in particular and 1 on the left but again these are minimal. I do not see any signs of active infection at this time. No fevers, chills, nausea, vomiting, or diarrhea. 08/23/2021 upon evaluation today patient appears to be doing well with regard to his wounds. He has been tolerating the dressing changes without complication. Fortunately I do not see much open other than a small area on the right lateral leg. In general I think he is very close to complete resolution across the board. Hopefully should be ready for discharge come next week. 08/30/2021 upon evaluation today patient appears to be doing excellent in regard to his leg ulcers. There is actually nothing that show signs of any opening at this point which is also great news. Overall I am extremely pleased that he does have his juxta lite compression wraps as well. READMISSION 11/23/2021: This patient is well-known to our clinic and was only discharged a couple of months ago after undergoing treatment for bilateral lower extremity ulcers secondary to lymphedema. He was prescribed juxta lite stockings which he has not been wearing. He was also prescribed lymphedema pumps, which she has not been using. About 3 weeks ago, new wounds opened up on his bilateral lower extremities. He has circumferential opening on his left lower extremity and opening on his right calf. These are weeping serous fluid. He denies any fevers or chills. 11/30/2021: Both wounds have decreased in size bilaterally. He has been in 4-layer compression over silver alginate. He did notice some itching at the top of both sides of his wraps and there is some excoriation on his skin secondary to scratching. 12/07/2021: Both wounds continue to decrease in size. The right lower extremity wound is down to just  about a millimeter. There was some overlying slough and eschar, but the wound appears healthy. On the left calf wound, there is some hypertrophic granulation tissue. No concern for infection. 12/14/2021: His wounds are closed. 01/08/2022: The patient went on a cruise during which time he was not able to elevate his  legs in his room secondary to the configuration. He could not use his lymphedema pumps in his room because the bed and the electrical outlet were too far apart. He says that he did wear his juxta lite stockings, but nonetheless he has developed circumferential ulcers on his left lower extremity and multiple small ulcers on his right lower extremity. He says that the left, in particular, are quite painful. 01/15/2022: Today, all of his wounds have improved. The pain that he was experiencing on the left is no longer present. He has been able to use his lymphedema pumps at home. We have been applying topical gentamicin to the left sided wounds and silver alginate and 4-layer compression to both limbs. 01/22/2022: His wounds continue to heal. The right medial is closed. The right and left lower leg wounds are smaller. The right anterior wound is also smaller and just has a small amount of slough. He is using his lymphedema pumps. 02/06/2022: His wounds are healed. 03/09/2022: Unfortunately, he has reopened wounds on both legs. There are new open ulcers on the anterior tibial surfaces bilaterally and the lateral calves bilaterally. He says that he has been using his lymphedema pumps, but because of his hip, he is unable to elevate his legs sufficiently. There is slough accumulation on all of the wound surfaces, but no concern for infection. He does have multiple blisters all over both lower legs, clearly at risk for opening and extending the ulceration. 03/16/2022: Many of the smaller wounds have epithelialized. There is also perimeter epithelialization of the larger sites. There is a little bit of  slough and eschar accumulation at each wound site. The blisters that were seen last week have fortunately not opened and are no longer present. Patient History Information obtained from Patient. Family History Cancer - Mother,Father. Social History Never smoker, Marital Status - Married, Alcohol Use - Rarely, Drug Use - No History, Caffeine Use - Rarely. Medical History Eyes Denies history of Cataracts, Glaucoma, Optic Neuritis Ear/Nose/Mouth/Throat Denies history of Chronic sinus problems/congestion, Middle ear problems Hematologic/Lymphatic Denies history of Anemia, Hemophilia, Human Immunodeficiency Virus, Lymphedema, Sickle Cell Disease Respiratory Denies history of Aspiration, Asthma, Chronic Obstructive Pulmonary Disease (COPD), Pneumothorax, Sleep Apnea, Tuberculosis Cardiovascular Patient has history of Hypertension, Peripheral Venous Disease Denies history of Angina, Arrhythmia, Congestive Heart Failure, Coronary Artery Disease, Deep Vein Thrombosis, Hypotension, Myocardial Infarction, Peripheral Arterial Disease, Phlebitis, Vasculitis Gastrointestinal Denies history of Cirrhosis , Colitis, Crohnoos, Hepatitis A, Hepatitis B, Hepatitis C Endocrine Denies history of Type I Diabetes, Type II Diabetes Genitourinary Denies history of End Stage Renal Disease Immunological Denies history of Lupus Erythematosus, Raynaudoos, Scleroderma Integumentary (Skin) Denies history of History of Burn Musculoskeletal Patient has history of Osteoarthritis - left hip Denies history of Gout, Rheumatoid Arthritis, Osteomyelitis Neurologic Denies history of Dementia, Neuropathy, Quadriplegia, Paraplegia, Seizure Disorder Oncologic Denies history of Received Chemotherapy, Received Radiation Psychiatric Denies history of Anorexia/bulimia, Confinement Anxiety Medical A Surgical History Notes nd Constitutional Symptoms (General Health) obestiy Objective Constitutional Hypertensive,  asymptomatic. No acute distress.. Vitals Time Taken: 10:21 AM, Height: 76 in, Weight: 400 lbs, BMI: 48.7, Temperature: 98.3 F, Pulse: 96 bpm, Respiratory Rate: 20 breaths/min, Blood Pressure: 167/118 mmHg. Respiratory Normal work of breathing on room air.. General Notes: 03/16/2022: Many of the smaller wounds have epithelialized. There is also perimeter epithelialization of the larger sites. There is a little bit of slough and eschar accumulation at each wound site. The blisters that were seen last week have fortunately not opened and are no longer  present. Integumentary (Hair, Skin) Wound #19 status is Open. Original cause of wound was Blister. The date acquired was: 02/23/2022. The wound has been in treatment 1 weeks. The wound is located on the Right,Lateral Lower Leg. The wound measures 1cm length x 2.1cm width x 0.1cm depth; 1.649cm^2 area and 0.165cm^3 volume. There is Fat Layer (Subcutaneous Tissue) exposed. There is no tunneling or undermining noted. There is a medium amount of serous drainage noted. The wound margin is distinct with the outline attached to the wound base. There is large (67-100%) red granulation within the wound bed. There is a small (1-33%) amount of necrotic tissue within the wound bed including Adherent Slough. Wound #20 status is Open. Original cause of wound was Blister. The date acquired was: 02/23/2022. The wound has been in treatment 1 weeks. The wound is located on the Right,Anterior Lower Leg. The wound measures 0.9cm length x 0.5cm width x 0.1cm depth; 0.353cm^2 area and 0.035cm^3 volume. There is Fat Layer (Subcutaneous Tissue) exposed. There is no tunneling or undermining noted. There is a medium amount of serous drainage noted. The wound margin is distinct with the outline attached to the wound base. There is large (67-100%) red granulation within the wound bed. There is a small (1-33%) amount of necrotic tissue within the wound bed including Adherent  Slough. Wound #21 status is Open. Original cause of wound was Blister. The date acquired was: 02/23/2022. The wound has been in treatment 1 weeks. The wound is located on the Left,Lateral Lower Leg. The wound measures 3.5cm length x 3cm width x 0.1cm depth; 8.247cm^2 area and 0.825cm^3 volume. There is Fat Layer (Subcutaneous Tissue) exposed. There is no tunneling or undermining noted. There is a medium amount of serous drainage noted. The wound margin is distinct with the outline attached to the wound base. There is medium (34-66%) red, pink granulation within the wound bed. There is a medium (34-66%) amount of necrotic tissue within the wound bed including Adherent Slough. Wound #22 status is Open. Original cause of wound was Blister. The date acquired was: 02/23/2022. The wound has been in treatment 1 weeks. The wound is located on the Left,Anterior Lower Leg. The wound measures 0cm length x 0cm width x 0cm depth; 0cm^2 area and 0cm^3 volume. There is no tunneling or undermining noted. There is a none present amount of drainage noted. The wound margin is distinct with the outline attached to the wound base. There is no granulation within the wound bed. There is no necrotic tissue within the wound bed. 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 There was a Selective/Open Wound Non-Viable Tissue Debridement with a total area of 2.1 sq cm performed by Duanne Guess, MD. With the following instrument(s): Curette to remove Non-Viable tissue/material. Material removed includes Eschar and Slough and after achieving pain control using Lidocaine 4% Topical Solution. No  specimens were taken. A time out was conducted at 10:44, prior to the start of the procedure. A Minimum amount of bleeding was controlled with Pressure. The procedure was tolerated well with a pain level of 0 throughout and a pain level of 0 following the procedure. Post Debridement Measurements: 1cm length x 2.1cm width x 0.1cm depth; 0.165cm^3 volume. Character  of Wound/Ulcer Post Debridement is improved. Severity of Tissue Post Debridement is: Fat layer exposed. Post procedure Diagnosis Wound #19: Same as Pre-Procedure Wound #20 Pre-procedure diagnosis of Wound #20 is a Venous Leg Ulcer located on the Right,Anterior Lower Leg .Severity of Tissue Pre Debridement is: Fat layer exposed. There was a Selective/Open Wound Non-Viable Tissue Debridement with a total area of 0.45 sq cm performed by Duanne Guess, MD. With the following instrument(s): Curette to remove Non-Viable tissue/material. Material removed includes [facility] after achieving pain control using Lidocaine 4% Topical Solution. No specimens were taken. A time out was conducted at 10:44, prior to the start of the procedure. A Minimum amount of bleeding was controlled with Pressure. The procedure was tolerated well with a pain level of 0 throughout and a pain level of 0 following the procedure. Post Debridement Measurements: 0.9cm length x 0.5cm width x 0.1cm depth; 0.035cm^3 volume. Character of Wound/Ulcer Post Debridement is improved. Severity of Tissue Post Debridement is: Fat layer exposed. Post procedure Diagnosis Wound #20: Same as Pre-Procedure Wound #21 Pre-procedure diagnosis of Wound #21 is a Venous Leg Ulcer located on the Left,Lateral Lower Leg .Severity of Tissue Pre Debridement is: Fat layer exposed. There was a Excisional Skin/Subcutaneous Tissue Debridement with a total area of 10.5 sq cm performed by Duanne Guess, MD. With the following instrument(s): Curette to remove Viable and Non-Viable tissue/material. Material  removed includes Subcutaneous Tissue and Slough and after achieving pain control using Lidocaine 4% T opical Solution. No specimens were taken. A time out was conducted at 10:44, prior to the start of the procedure. A Minimum amount of bleeding was controlled with Pressure. The procedure was tolerated well with a pain level of 0 throughout and a pain level of 0 following the procedure. Post Debridement Measurements: 3.5cm length x 3cm width x 0.1cm depth; 0.825cm^3 volume. Character of Wound/Ulcer Post Debridement is improved. Severity of Tissue Post Debridement is: Fat layer exposed. Post procedure Diagnosis Wound #21: Same as Pre-Procedure Plan Follow-up Appointments: Return Appointment in 1 week. - Dr. Lady Gary - Room 2 - 7/7 at 9:15 AM Bathing/ Shower/ Hygiene: May shower with protection but do not get wound dressing(s) wet. - may purchase cast protectors from [facility] or [facility] Edema Control - Lymphedema / SCD / Other: Lymphedema Pumps. Use Lymphedema pumps on leg(s) 2-3 times a day for 45-60 minutes. If wearing any wraps or hose, do not remove them. Continue exercising as instructed. Elevate legs to the level of the heart or above for 30 minutes daily and/or when sitting, a frequency of: Avoid standing for long periods of time. Patient to wear own compression stockings every day. WOUND #19: - Lower Leg Wound Laterality: Right, Lateral Cleanser: Soap and Water 1 x Per Week/30 Days Discharge Instructions: May shower and wash wound with dial antibacterial soap and water prior to dressing change. Cleanser: Wound Cleanser 1 x Per Week/30 Days Discharge Instructions: Cleanse the wound with wound cleanser prior to applying a clean dressing using gauze sponges, not tissue or cotton balls. Peri-Wound Care: Triamcinolone 15 (g) 1 x Per Week/30 Days Discharge Instructions: Use triamcinolone 15 (g) as directed Peri-Wound Care: Sween Lotion (Moisturizing lotion) 1 x Per Week/30 Days Discharge  Instructions: Apply moisturizing lotion as directed Prim Dressing: KerraCel Ag Gelling Fiber Dressing, 4x5 in (silver alginate) 1 x Per Week/30 Days ary Discharge Instructions: Apply silver alginate to wound bed as instructed Secondary Dressing: Woven Gauze Sponge, Non-Sterile 4x4 in 1 x Per Week/30 Days Discharge Instructions: Apply over primary  dressing as directed. Secured With: 70M Medipore H Soft Cloth Surgical T ape, 4 x 10 (in/yd) 1 x Per Week/30 Days Discharge Instructions: Secure with tape as directed. Com pression Wrap: FourPress (4 layer compression wrap) 1 x Per Week/30 Days Discharge Instructions: Apply four layer compression as directed. May also use Miliken CoFlex 2 layer compression system as alternative. WOUND #20: - Lower Leg Wound Laterality: Right, Anterior Cleanser: Soap and Water 1 x Per Week/30 Days Discharge Instructions: May shower and wash wound with dial antibacterial soap and water prior to dressing change. Cleanser: Wound Cleanser 1 x Per Week/30 Days Discharge Instructions: Cleanse the wound with wound cleanser prior to applying a clean dressing using gauze sponges, not tissue or cotton balls. Peri-Wound Care: Triamcinolone 15 (g) 1 x Per Week/30 Days Discharge Instructions: Use triamcinolone 15 (g) as directed Peri-Wound Care: Sween Lotion (Moisturizing lotion) 1 x Per Week/30 Days Discharge Instructions: Apply moisturizing lotion as directed Prim Dressing: KerraCel Ag Gelling Fiber Dressing, 4x5 in (silver alginate) 1 x Per Week/30 Days ary Discharge Instructions: Apply silver alginate to wound bed as instructed Secondary Dressing: Woven Gauze Sponge, Non-Sterile 4x4 in 1 x Per Week/30 Days Discharge Instructions: Apply over primary dressing as directed. Secured With: 70M Medipore H Soft Cloth Surgical T ape, 4 x 10 (in/yd) 1 x Per Week/30 Days Discharge Instructions: Secure with tape as directed. Com pression Wrap: FourPress (4 layer compression wrap) 1 x Per  Week/30 Days Discharge Instructions: Apply four layer compression as directed. May also use Miliken CoFlex 2 layer compression system as alternative. WOUND #21: - Lower Leg Wound Laterality: Left, Lateral Cleanser: Soap and Water 1 x Per Week/30 Days Discharge Instructions: May shower and wash wound with dial antibacterial soap and water prior to dressing change. Cleanser: Wound Cleanser 1 x Per Week/30 Days Discharge Instructions: Cleanse the wound with wound cleanser prior to applying a clean dressing using gauze sponges, not tissue or cotton balls. Peri-Wound Care: Triamcinolone 15 (g) 1 x Per Week/30 Days Discharge Instructions: Use triamcinolone 15 (g) as directed Peri-Wound Care: Sween Lotion (Moisturizing lotion) 1 x Per Week/30 Days Discharge Instructions: Apply moisturizing lotion as directed Prim Dressing: KerraCel Ag Gelling Fiber Dressing, 4x5 in (silver alginate) 1 x Per Week/30 Days ary Discharge Instructions: Apply silver alginate to wound bed as instructed Secondary Dressing: ABD Pad, 5x9 1 x Per Week/30 Days Discharge Instructions: Apply over primary dressing as directed. Secondary Dressing: Woven Gauze Sponge, Non-Sterile 4x4 in 1 x Per Week/30 Days Discharge Instructions: Apply over primary dressing as directed. Secured With: 70M Medipore H Soft Cloth Surgical T ape, 4 x 10 (in/yd) 1 x Per Week/30 Days Discharge Instructions: Secure with tape as directed. Compression Wrap: FourPress (4 layer compression wrap) 1 x Per Week/30 Days Discharge Instructions: Apply four layer compression as directed. May also use Miliken CoFlex 2 layer compression system as alternative. 03/16/2022: Many of the smaller wounds have epithelialized. There is also perimeter epithelialization of the larger sites. There is a little bit of slough and eschar accumulation at each wound site. The blisters that were seen last week have fortunately not opened and are no longer present. I used a curette to  debride eschar and slough from the wounds, as well as slough and subcutaneous tissue from the largest on his left lateral calf. We will continue using silver alginate with 4-layer compression. Follow-up in 1 week. Electronic Signature(s) Signed: 03/16/2022 10:58:17 AM By: Duanne Guessannon, Amry Cathy MD FACS Entered By: Duanne Guessannon, Marshall Kampf on 03/16/2022 10:58:17 --------------------------------------------------------------------------------  HxROS Details Patient Name: Date of Service: [name] [facility] 03/16/2022 10:00 A M Medical Record Number: [id number] Patient Account Number: [id number] Date of Birth/Sex: Treating RN: [birth year] (56 y.o. Marlan Palau Primary Care Provider: PCP, NO Other Clinician: Referring Provider: Treating Provider/Extender: Mare Ferrari [name]: 16 Information Obtained From Patient Constitutional Symptoms (General Health) Medical History: Past Medical History Notes: obestiy Eyes Medical History: Negative for: Cataracts; Glaucoma; Optic Neuritis Ear/Nose/Mouth/Throat Medical History: Negative for: Chronic sinus problems/congestion; Middle ear problems Hematologic/Lymphatic Medical History: Negative for: Anemia; Hemophilia; Human Immunodeficiency Virus; Lymphedema; Sickle Cell Disease Respiratory Medical History: Negative for: Aspiration; Asthma; Chronic Obstructive Pulmonary Disease (COPD); Pneumothorax; Sleep Apnea; Tuberculosis Cardiovascular Medical History: Positive for: Hypertension; Peripheral Venous Disease Negative for: Angina; Arrhythmia; Congestive Heart Failure; Coronary Artery Disease; Deep Vein Thrombosis; Hypotension; Myocardial Infarction; Peripheral Arterial Disease; Phlebitis; Vasculitis Gastrointestinal Medical History: Negative for: Cirrhosis ; Colitis; Crohns; Hepatitis A; Hepatitis B; Hepatitis C Endocrine Medical History: Negative for: Type I Diabetes; Type II Diabetes Genitourinary Medical  History: Negative for: End Stage Renal Disease Immunological Medical History: Negative for: Lupus Erythematosus; Raynauds; Scleroderma Integumentary (Skin) Medical History: Negative for: History of Burn Musculoskeletal Medical History: Positive for: Osteoarthritis - left hip Negative for: Gout; Rheumatoid Arthritis; Osteomyelitis Neurologic Medical History: Negative for: Dementia; Neuropathy; Quadriplegia; Paraplegia; Seizure Disorder Oncologic Medical History: Negative for: Received Chemotherapy; Received Radiation Psychiatric Medical History: Negative for: Anorexia/bulimia; Confinement Anxiety Immunizations Pneumococcal Vaccine: Received Pneumococcal Vaccination: No Implantable Devices None Family and Social History Cancer: Yes - Mother,Father; Never smoker; Marital Status - Married; Alcohol Use: Rarely; Drug Use: No History; Caffeine Use: Rarely; Financial Concerns: No; Food, Clothing or Shelter Needs: No; Support System Lacking: No; Transportation Concerns: No Electronic Signature(s) Signed: 03/16/2022 11:01:22 AM By: Duanne Guess MD FACS Signed: 03/16/2022 5:03:19 PM By: Gelene Mink By: Duanne Guess on 03/16/2022 10:57:06 -------------------------------------------------------------------------------- SuperBill Details Patient Name: Date of Service: [name] 03/16/2022 Medical Record Number: [id number] Patient Account Number: [id number] Date of Birth/Sex: Treating RN: 05-02-1966 (56 y.o. Marlan Palau Primary Care Provider: PCP, NO Other Clinician: Referring Provider: Treating Provider/Extender: Mare Ferrari [name]: 16 Diagnosis Coding ICD-10 Codes Code Description I89.0 Lymphedema, not elsewhere classified L97.222 Non-pressure chronic ulcer of left calf with fat layer exposed L97.211 Non-pressure chronic ulcer of right calf limited to breakdown of skin Facility Procedures CPT4 Code:  [id number] Description: 11042 - DEB SUBQ TISSUE 20 SQ CM/< ICD-10 Diagnosis Description L97.222 Non-pressure chronic ulcer of left calf with fat layer exposed Modifier: Quantity: 1 CPT4 Code: [id number] Description: 97597 - DEBRIDE WOUND 1ST 20 SQ CM OR < ICD-10 Diagnosis Description L97.222 Non-pressure chronic ulcer of left calf with fat layer exposed L97.211 Non-pressure chronic ulcer of right calf limited to breakdown of skin Modifier: Quantity: 1 Physician Procedures : CPT4 Code Description Modifier [id number] 99213 - WC PHYS LEVEL 3 - EST PT 25 ICD-10 Diagnosis Description L97.222 Non-pressure chronic ulcer of left calf with fat layer exposed L97.211 Non-pressure chronic ulcer of right calf limited to breakdown of skin  I89.0 Lymphedema, not elsewhere classified Quantity: 1 : [id number] 11042 - WC PHYS SUBQ TISS 20 SQ CM ICD-10 Diagnosis Description L97.222 Non-pressure chronic ulcer of left calf with fat layer exposed Quantity: 1 : [id number] 97597 - WC PHYS DEBR WO ANESTH 20 SQ CM ICD-10 Diagnosis Description L97.222 Non-pressure chronic ulcer of left calf with fat layer exposed L97.211 Non-pressure chronic ulcer of right calf limited to breakdown of skin Quantity:  1 Electronic Signature(s) Signed: 03/16/2022 10:58:42 AM By: Duanne Guess MD FACS Entered By: Duanne Guess on 03/16/2022 10:58:41

## 2022-03-16 NOTE — Progress Notes (Signed)
Chad [name], Chad [name] ([id number]) Visit Report for 03/16/2022 Arrival Information Details Patient Name: Date of Service: Chad [name] [facility] 03/16/2022 10:00 A M Medical Record Number: [id number] Patient Account Number: [id number] Date of Birth/Sex: Treating RN: Aug 30, 1966 (56 y.o. Marlan Palau Primary Care Salah Nakamura: PCP, NO Other Clinician: Referring Sandara Tyree: Treating Shelby Peltz/Extender: Mare Ferrari [name]: 16 Visit Information History Since Last Visit Added or deleted any medications: No Patient Arrived: Ambulatory Any new allergies or adverse reactions: No Arrival Time: 10:15 Had a fall or experienced change in No Accompanied By: self activities of daily living that may affect Transfer Assistance: None risk of falls: Patient Identification Verified: Yes Signs or symptoms of abuse/neglect since last visito No Secondary Verification Process Completed: Yes Hospitalized since last visit: No Patient Requires Transmission-Based Precautions: No Implantable device outside of the clinic excluding No Patient Has Alerts: Yes cellular tissue based products placed in the center Patient Alerts: ABI non compressible bil since last visit: Has Dressing in Place as Prescribed: Yes Has Compression in Place as Prescribed: Yes Pain Present Now: No Electronic Signature(s) Signed: 03/16/2022 5:03:19 PM By: Samuella Bruin Entered By: Samuella Bruin on 03/16/2022 10:21:03 -------------------------------------------------------------------------------- Compression Therapy Details Patient Name: Date of Service: Chad [name] [facility] 03/16/2022 10:00 A M Medical Record Number: [id number] Patient Account Number: [id number] Date of Birth/Sex: Treating RN: [birth year] (56 y.o. Marlan Palau Primary Care Kennia Vanvorst: PCP, NO Other Clinician: Referring Amberlee Garvey: Treating Schyler Counsell/Extender: Mare Ferrari [name]: 16 Compression Therapy  Performed for Wound Assessment: Wound #19 Right,Lateral Lower Leg Performed By: Clinician Samuella Bruin, RN Compression Type: Four Layer Post Procedure Diagnosis Same as Pre-procedure Electronic Signature(s) Signed: 03/16/2022 5:03:19 PM By: Gelene Mink By: Samuella Bruin on 03/16/2022 16:43:36 -------------------------------------------------------------------------------- Compression Therapy Details Patient Name: Date of Service: Chad [name] [facility] 03/16/2022 10:00 A M Medical Record Number: [id number] Patient Account Number: [id number] Date of Birth/Sex: Treating RN: Feb 06, 1966 (56 y.o. Marlan Palau Primary Care Elowyn Raupp: PCP, NO Other Clinician: Referring Markis Langland: Treating Vincentina Sollers/Extender: Mare Ferrari [name]: 16 Compression Therapy Performed for Wound Assessment: Wound #21 Left,Lateral Lower Leg Performed By: Clinician Samuella Bruin, RN Compression Type: Four Layer Post Procedure Diagnosis Same as Pre-procedure Electronic Signature(s) Signed: 03/16/2022 5:03:19 PM By: Gelene Mink By: Samuella Bruin on 03/16/2022 16:43:36 -------------------------------------------------------------------------------- Encounter Discharge Information Details Patient Name: Date of Service: Chad [name] [facility] 03/16/2022 10:00 A M Medical Record Number: [id number] Patient Account Number: [id number] Date of Birth/Sex: Treating RN: April 01, 1966 (56 y.o. Marlan Palau Primary Care Adelis Docter: PCP, NO Other Clinician: Referring Mande Auvil: Treating Awesome Jared/Extender: Mare Ferrari [name]: 16 Encounter Discharge Information Items Post Procedure Vitals Discharge Condition: Stable Temperature (F): 98.3 Ambulatory Status: Ambulatory Pulse (bpm): 96 Discharge Destination: Home Respiratory Rate (breaths/min): 20 Transportation: Private Auto Blood Pressure (mmHg):  167/118 Accompanied By: self Schedule Follow-up Appointment: Yes Clinical Summary of Care: Patient Declined Electronic Signature(s) Signed: 03/16/2022 5:03:19 PM By: Samuella Bruin Entered By: Samuella Bruin on 03/16/2022 11:19:24 -------------------------------------------------------------------------------- Lower Extremity Assessment Details Patient Name: Date of Service: Chad [name] [facility] 03/16/2022 10:00 A M Medical Record Number: [id number] Patient Account Number: [id number] Date of Birth/Sex: Treating RN: 02-02-1966 (56 y.o. Marlan Palau Primary Care Kayse Puccini: PCP, NO Other Clinician: Referring Braelynn Lupton: Treating Wael Maestas/Extender: Mare Ferrari [name]: 16 Edema Assessment Assessed: [Left: No] [Right: No] Edema: [Left: Yes] [Right: Yes] Calf Left: Right: Point of Measurement: 40 cm From Medial  Instep 51.5 cm 51 cm Ankle Left: Right: Point of Measurement: 10 cm From Medial Instep 25.6 cm 26.3 cm Vascular Assessment Pulses: Dorsalis Pedis Palpable: [Left:Yes] [Right:Yes] Electronic Signature(s) Signed: 03/16/2022 5:03:19 PM By: Samuella Bruin Entered By: Samuella Bruin on 03/16/2022 10:32:22 -------------------------------------------------------------------------------- Multi Wound Chart Details Patient Name: Date of Service: Chad [name] [name] 03/16/2022 10:00 A M Medical Record Number: [id number] Patient Account Number: [id number] Date of Birth/Sex: Treating RN: [birth year] (56 y.o. Marlan Palau Primary Care Aubriel Khanna: PCP, NO Other Clinician: Referring Eusebia Grulke: Treating Jahzir Strohmeier/Extender: Mare Ferrari [name]: 16 Vital Signs Height(in): 76 Pulse(bpm): 96 Weight(lbs): 400 Blood Pressure(mmHg): 167/118 Body Mass Index(BMI): 48.7 Temperature(F): 98.3 Respiratory Rate(breaths/min): 20 Photos: Right, Lateral Lower Leg Right, Anterior Lower Leg Left, Lateral Lower  Leg Wound Location: Blister Blister Blister Wounding Event: Venous Leg Ulcer Venous Leg Ulcer Venous Leg Ulcer Primary Etiology: Hypertension, Peripheral Venous Hypertension, Peripheral Venous Hypertension, Peripheral Venous Comorbid History: Disease, Osteoarthritis Disease, Osteoarthritis Disease, Osteoarthritis 02/23/2022 02/23/2022 02/23/2022 Date Acquired: 1 1 1  Weeks of Treatment: Open Open Open Wound Status: No No No Wound Recurrence: 1x2.1x0.1 0.9x0.5x0.1 3.5x3x0.1 Measurements L x W x D (cm) 1.649 0.353 8.247 A (cm) : rea 0.165 0.035 0.825 Volume (cm) : 95.00% 75.30% 89.50% % Reduction in Area: 95.00% 75.50% 89.50% % Reduction in Volume: Full Thickness Without Exposed Full Thickness Without Exposed Full Thickness Without Exposed Classification: Support Structures Support Structures Support Structures Medium Medium Medium Exudate Amount: Serous Serous Serous Exudate Type: Exudate Color: Distinct, outline attached Distinct, outline attached Distinct, outline attached Wound Margin: Large (67-100%) Large (67-100%) Medium (34-66%) Granulation Amount: Red Red Red, Pink Granulation Quality: Small (1-33%) Small (1-33%) Medium (34-66%) Necrotic Amount: Fat Layer (Subcutaneous Tissue): Yes Fat Layer (Subcutaneous Tissue): Yes Fat Layer (Subcutaneous Tissue): Yes Exposed Structures: Fascia: No Fascia: No Fascia: No Tendon: No Tendon: No Tendon: No Muscle: No Muscle: No Muscle: No Joint: No Joint: No Joint: No Bone: No Bone: No Bone: No Medium (34-66%) None Medium (34-66%) Epithelialization: Debridement - Selective/Open Wound Debridement - Selective/Open Wound Debridement - Excisional Debridement: Pre-procedure Verification/Time Out 10:44 10:44 10:44 Taken: Lidocaine 4% Topical Solution Lidocaine 4% Topical Solution Lidocaine 4% Topical Solution Pain Control: Necrotic/Eschar, Psychologist, forensic, [employer] Tissue  Debrided: Non-Viable Tissue Non-Viable Tissue Skin/Subcutaneous Tissue Level: 2.1 0.45 10.5 Debridement A (sq cm): rea Curette Curette Curette Instrument: Minimum Minimum Minimum Bleeding: Pressure Pressure Pressure Hemostasis A chieved: 0 0 0 Procedural Pain: 0 0 0 Post Procedural Pain: Procedure was tolerated well Procedure was tolerated well Procedure was tolerated well Debridement Treatment Response: 1x2.1x0.1 0.9x0.5x0.1 3.5x3x0.1 Post Debridement Measurements L x W x D (cm) 0.165 0.035 0.825 Post Debridement Volume: (cm) Debridement Debridement Debridement Procedures Performed: Wound Number: 22 N/A N/A Photos: N/A N/A Left, Anterior Lower Leg N/A N/A Wound Location: Blister N/A N/A Wounding Event: Venous Leg Ulcer N/A N/A Primary Etiology: Hypertension, Peripheral Venous N/A N/A Comorbid History: Disease, Osteoarthritis 02/23/2022 N/A N/A Date A cquired: 1 N/A N/A Weeks of Treatment: Open N/A N/A Wound Status: No N/A N/A Wound Recurrence: 0x0x0 N/A N/A Measurements L x W x D (cm) 0 N/A N/A A (cm) : rea 0 N/A N/A Volume (cm) : 100.00% N/A N/A % Reduction in A rea: 100.00% N/A N/A % Reduction in Volume: Full Thickness Without Exposed N/A N/A Classification: Support Structures None Present N/A N/A Exudate A mount: N/A N/A N/A Exudate Type: N/A N/A N/A Exudate Color: Distinct, outline attached N/A N/A Wound Margin: None Present (  0%) N/A N/A Granulation A mount: N/A N/A N/A Granulation Quality: None Present (0%) N/A N/A Necrotic A mount: Fascia: No N/A N/A Exposed Structures: Fat Layer (Subcutaneous Tissue): No Tendon: No Muscle: No Joint: No Bone: No Large (67-100%) N/A N/A Epithelialization: N/A N/A N/A Debridement: N/A N/A N/A Pain Control: N/A N/A N/A Tissue Debrided: N/A N/A N/A Level: N/A N/A N/A Debridement A (sq cm): rea N/A N/A N/A Instrument: N/A N/A N/A Bleeding: N/A N/A N/A Hemostasis A chieved: N/A N/A  N/A Procedural Pain: N/A N/A N/A Post Procedural Pain: Debridement Treatment Response: N/A N/A N/A Post Debridement Measurements L x N/A N/A N/A W x D (cm) N/A N/A N/A Post Debridement Volume: (cm) N/A N/A N/A Procedures Performed: Treatment Notes Electronic Signature(s) Signed: 03/16/2022 10:53:56 AM By: Duanne Guessannon, Jennifer MD FACS Signed: 03/16/2022 5:03:19 PM By: Samuella BruinHerrington, Taylor Entered By: Duanne Guessannon, Jennifer on 03/16/2022 10:53:56 -------------------------------------------------------------------------------- Multi-Disciplinary Care Plan Details Patient Name: Date of Service: [facility]16/[facility] 10:00 A M Medical Record Number: [id number] Patient Account Number: [id number] Date of Birth/Sex: Treating RN: 12/05/1965 (56 y.o. Marlan PalauM) Herrington, Taylor Primary Care Mairely Foxworth: PCP, NO Other Clinician: Referring Azia Toutant: Treating Lashawnda Hancox/Extender: [name], [name], [name] [name]: 16 Multidisciplinary Care Plan reviewed with physician Active Inactive Abuse / Safety / Falls / Self Care Management Nursing Diagnoses: Potential for falls Goals: Patient will not experience any injury related to falls Date Initiated: 03/09/2022 Target Resolution Date: 04/13/2022 Goal Status: Active Interventions: Assess impairment of mobility on admission and as needed per policy Notes: Electronic Signature(s) Signed: 03/16/2022 5:03:19 PM By: Samuella BruinHerrington, Taylor Entered By: Samuella BruinHerrington, Taylor on 03/16/2022 10:35:04 -------------------------------------------------------------------------------- Pain Assessment Details Patient Name: Date of Service: [facility]16/[facility] 10:00 A M Medical Record Number: [id number] Patient Account Number: [id number] Date of Birth/Sex: Treating RN: 08/29/1966 (56 y.o. Marlan PalauM) Herrington, Taylor Primary Care Tyrann Donaho: PCP, NO Other Clinician: Referring Cederick Broadnax: Treating Mcguire Gasparyan/Extender: [name], [name], [name] Weeks in  Treatment: 16 Active Problems Location of Pain Severity and Description of Pain Patient Has Paino No Site Locations Rate the pain. Rate the pain. Current Pain Level: 0 Pain Management and Medication Current Pain Management: Electronic Signature(s) Signed: 03/16/2022 5:03:19 PM By: Samuella BruinHerrington, Taylor Entered By: Samuella BruinHerrington, Taylor on 03/16/2022 10:31:28 -------------------------------------------------------------------------------- Patient/Caregiver Education Details Patient Name: Date of Service: Chad CitizenHO MA S, [place] [name] 6/30/2023andnbsp10:00 A M Medical Record Number: [id number] Patient Account Number: [id number] Date of Birth/Gender: Treating RN: 12/02/1965 (56 y.o. Marlan PalauM) Herrington, Taylor Primary Care Physician: PCP, NO Other Clinician: Referring Physician: Treating Physician/Extender: [name], [name], [name] [name]: 16 Education Assessment Education Provided To: Patient Education Topics Provided Wound/Skin Impairment: Methods: Explain/Verbal Responses: Reinforcements needed, State content correctly Electronic Signature(s) Signed: 03/16/2022 5:03:19 PM By: Samuella BruinHerrington, Taylor Entered By: Samuella BruinHerrington, Taylor on 03/16/2022 10:35:22 -------------------------------------------------------------------------------- Wound Assessment Details Patient Name: Date of Service: [facility]16/[facility] 10:00 A M Medical Record Number: [id number] Patient Account Number: [id number] Date of Birth/Sex: Treating RN: 06/20/1966 (56 y.o. Marlan PalauM) Herrington, Taylor Primary Care Kinta Martis: PCP, NO Other Clinician: Referring Lyriq Jarchow: Treating Malaysha Arlen/Extender: [name], [name], [name] [name]: 16 Wound Status Wound Number: 19 Primary Etiology: Venous Leg Ulcer Wound Location: Right, Lateral Lower Leg Wound Status: Open Wounding Event: Blister Comorbid History: Hypertension, Peripheral Venous Disease, Osteoarthritis Date Acquired: 02/23/2022 Weeks Of  Treatment: 1 Clustered Wound: No Photos Wound Measurements Length: (cm) 1 Width: (cm) 2.1 Depth: (cm) 0.1 Area: (cm) 1.649 Volume: (cm) 0.165 % Reduction in Area: 95% % Reduction in Volume: 95%  Epithelialization: Medium (34-66%) Tunneling: No Undermining: No Wound Description Classification: Full Thickness Without Exposed Support Structures Wound Margin: Distinct, outline attached Exudate Amount: Medium Exudate Type: Serous Exudate Color: amber Foul Odor After Cleansing: No Slough/Fibrino Yes Wound Bed Granulation Amount: Large (67-100%) Exposed Structure Granulation Quality: Red Fascia Exposed: No Necrotic Amount: Small (1-33%) Fat Layer (Subcutaneous Tissue) Exposed: Yes Necrotic Quality: Adherent Slough Tendon Exposed: No Muscle Exposed: No Joint Exposed: No Bone Exposed: No Treatment Notes Wound #19 (Lower Leg) Wound Laterality: Right, Lateral Cleanser Soap and Water Discharge Instruction: May shower and wash wound with dial antibacterial soap and water prior to dressing change. Wound Cleanser Discharge Instruction: Cleanse the wound with wound cleanser prior to applying a clean dressing using gauze sponges, not tissue or cotton balls. Peri-Wound Care Triamcinolone 15 (g) Discharge Instruction: Use triamcinolone 15 (g) as directed Sween Lotion (Moisturizing lotion) Discharge Instruction: Apply moisturizing lotion as directed Topical Primary Dressing KerraCel Ag Gelling Fiber Dressing, 4x5 in (silver alginate) Discharge Instruction: Apply silver alginate to wound bed as instructed Secondary Dressing Woven Gauze Sponge, Non-Sterile 4x4 in Discharge Instruction: Apply over primary dressing as directed. Secured With 67M Medipore H Soft Cloth Surgical T ape, 4 x 10 (in/yd) Discharge Instruction: Secure with tape as directed. Compression Wrap FourPress (4 layer compression wrap) Discharge Instruction: Apply four layer compression as directed. May also use  Miliken CoFlex 2 layer compression system as alternative. Compression Stockings Add-Ons Electronic Signature(s) Signed: 03/16/2022 5:03:19 PM By: Samuella Bruin Entered By: Samuella Bruin on 03/16/2022 10:36:47 -------------------------------------------------------------------------------- Wound Assessment Details Patient Name: Date of Service: Chad [name] [facility] 03/16/2022 10:00 A M Medical Record Number: [id number] Patient Account Number: [id number] Date of Birth/Sex: Treating RN: [birth year] (56 y.o. Marlan Palau Primary Care Amity Roes: PCP, NO Other Clinician: Referring Briona Korpela: Treating Laurine Kuyper/Extender: Mare Ferrari [name]: 16 Wound Status Wound Number: 20 Primary Etiology: Venous Leg Ulcer Wound Location: Right, Anterior Lower Leg Wound Status: Open Wounding Event: Blister Comorbid History: Hypertension, Peripheral Venous Disease, Osteoarthritis Date Acquired: 02/23/2022 Weeks Of Treatment: 1 Clustered Wound: No Photos Wound Measurements Length: (cm) 0.9 Width: (cm) 0.5 Depth: (cm) 0.1 Area: (cm) 0.353 Volume: (cm) 0.035 % Reduction in Area: 75.3% % Reduction in Volume: 75.5% Epithelialization: None Tunneling: No Undermining: No Wound Description Classification: Full Thickness Without Exposed Support Structures Wound Margin: Distinct, outline attached Exudate Amount: Medium Exudate Type: Serous Exudate Color: amber Foul Odor After Cleansing: No Slough/Fibrino Yes Wound Bed Granulation Amount: Large (67-100%) Exposed Structure Granulation Quality: Red Fascia Exposed: No Necrotic Amount: Small (1-33%) Fat Layer (Subcutaneous Tissue) Exposed: Yes Necrotic Quality: Adherent Slough Tendon Exposed: No Muscle Exposed: No Joint Exposed: No Bone Exposed: No Treatment Notes Wound #20 (Lower Leg) Wound Laterality: Right, Anterior Cleanser Soap and Water Discharge Instruction: May shower and wash wound with dial  antibacterial soap and water prior to dressing change. Wound Cleanser Discharge Instruction: Cleanse the wound with wound cleanser prior to applying a clean dressing using gauze sponges, not tissue or cotton balls. Peri-Wound Care Triamcinolone 15 (g) Discharge Instruction: Use triamcinolone 15 (g) as directed Sween Lotion (Moisturizing lotion) Discharge Instruction: Apply moisturizing lotion as directed Topical Primary Dressing KerraCel Ag Gelling Fiber Dressing, 4x5 in (silver alginate) Discharge Instruction: Apply silver alginate to wound bed as instructed Secondary Dressing Woven Gauze Sponge, Non-Sterile 4x4 in Discharge Instruction: Apply over primary dressing as directed. Secured With 67M Medipore H Soft Cloth Surgical T ape, 4 x 10 (in/yd) Discharge Instruction: Secure with tape as directed. Compression  Wrap FourPress (4 layer compression wrap) Discharge Instruction: Apply four layer compression as directed. May also use Miliken CoFlex 2 layer compression system as alternative. Compression Stockings Add-Ons Electronic Signature(s) Signed: 03/16/2022 5:03:19 PM By: Samuella Bruin Entered By: Samuella Bruin on 03/16/2022 10:37:09 -------------------------------------------------------------------------------- Wound Assessment Details Patient Name: Date of Service: Chad [name] [facility] 03/16/2022 10:00 A M Medical Record Number: [id number] Patient Account Number: [id number] Date of Birth/Sex: Treating RN: [birth year] (56 y.o. Marlan Palau Primary Care Aurea Aronov: PCP, NO Other Clinician: Referring Modena Bellemare: Treating Jasmeet Gehl/Extender: Mare Ferrari [name]: 16 Wound Status Wound Number: 21 Primary Etiology: Venous Leg Ulcer Wound Location: Left, Lateral Lower Leg Wound Status: Open Wounding Event: Blister Comorbid History: Hypertension, Peripheral Venous Disease, Osteoarthritis Date Acquired: 02/23/2022 Weeks Of Treatment:  1 Clustered Wound: No Photos Wound Measurements Length: (cm) 3.5 Width: (cm) 3 Depth: (cm) 0.1 Area: (cm) 8.247 Volume: (cm) 0.825 % Reduction in Area: 89.5% % Reduction in Volume: 89.5% Epithelialization: Medium (34-66%) Tunneling: No Undermining: No Wound Description Classification: Full Thickness Without Exposed Support Structures Wound Margin: Distinct, outline attached Exudate Amount: Medium Exudate Type: Serous Exudate Color: amber Foul Odor After Cleansing: No Slough/Fibrino Yes Wound Bed Granulation Amount: Medium (34-66%) Exposed Structure Granulation Quality: Red, Pink Fascia Exposed: No Necrotic Amount: Medium (34-66%) Fat Layer (Subcutaneous Tissue) Exposed: Yes Necrotic Quality: Adherent Slough Tendon Exposed: No Muscle Exposed: No Joint Exposed: No Bone Exposed: No Treatment Notes Wound #21 (Lower Leg) Wound Laterality: Left, Lateral Cleanser Soap and Water Discharge Instruction: May shower and wash wound with dial antibacterial soap and water prior to dressing change. Wound Cleanser Discharge Instruction: Cleanse the wound with wound cleanser prior to applying a clean dressing using gauze sponges, not tissue or cotton balls. Peri-Wound Care Triamcinolone 15 (g) Discharge Instruction: Use triamcinolone 15 (g) as directed Sween Lotion (Moisturizing lotion) Discharge Instruction: Apply moisturizing lotion as directed Topical Primary Dressing KerraCel Ag Gelling Fiber Dressing, 4x5 in (silver alginate) Discharge Instruction: Apply silver alginate to wound bed as instructed Secondary Dressing ABD Pad, 5x9 Discharge Instruction: Apply over primary dressing as directed. Woven Gauze Sponge, Non-Sterile 4x4 in Discharge Instruction: Apply over primary dressing as directed. Secured With 54M Medipore H Soft Cloth Surgical T ape, 4 x 10 (in/yd) Discharge Instruction: Secure with tape as directed. Compression Wrap FourPress (4 layer compression  wrap) Discharge Instruction: Apply four layer compression as directed. May also use Miliken CoFlex 2 layer compression system as alternative. Compression Stockings Add-Ons Electronic Signature(s) Signed: 03/16/2022 5:03:19 PM By: Samuella Bruin Entered By: Samuella Bruin on 03/16/2022 10:37:53 -------------------------------------------------------------------------------- Wound Assessment Details Patient Name: Date of Service: Chad [name] [facility] 03/16/2022 10:00 A M Medical Record Number: [id number] Patient Account Number: [id number] Date of Birth/Sex: Treating RN: 28-Aug-1966 (56 y.o. Marlan Palau Primary Care Ziggy Reveles: PCP, NO Other Clinician: Referring Lannie Heaps: Treating Kayler Rise/Extender: Mare Ferrari [name]: 16 Wound Status Wound Number: 22 Primary Etiology: Venous Leg Ulcer Wound Location: Left, Anterior Lower Leg Wound Status: Open Wounding Event: Blister Comorbid History: Hypertension, Peripheral Venous Disease, Osteoarthritis Date Acquired: 02/23/2022 Weeks Of Treatment: 1 Clustered Wound: No Photos Wound Measurements Length: (cm) Width: (cm) Depth: (cm) Area: (cm) Volume: (cm) 0 % Reduction in Area: 100% 0 % Reduction in Volume: 100% 0 Epithelialization: Large (67-100%) 0 Tunneling: No 0 Undermining: No Wound Description Classification: Full Thickness Without Exposed Support Structures Wound Margin: Distinct, outline attached Exudate Amount: None Present Foul Odor After Cleansing: No Slough/Fibrino Yes Wound Bed Granulation Amount:  None Present (0%) Exposed Structure Necrotic Amount: None Present (0%) Fascia Exposed: No Fat Layer (Subcutaneous Tissue) Exposed: No Tendon Exposed: No Muscle Exposed: No Joint Exposed: No Bone Exposed: No Electronic Signature(s) Signed: 03/16/2022 5:03:19 PM By: Samuella Bruin Entered By: Samuella Bruin on 03/16/2022  10:38:18 -------------------------------------------------------------------------------- Vitals Details Patient Name: Date of Service: Chad [name] [name] 03/16/2022 10:00 A M Medical Record Number: [id number] Patient Account Number: [id number] Date of Birth/Sex: Treating RN: 03-25-1966 (56 y.o. Marlan Palau Primary Care Eladio Dentremont: PCP, NO Other Clinician: Referring Damyiah Moxley: Treating Jossie Smoot/Extender: Mare Ferrari [name]: 16 Vital Signs Time Taken: 10:21 Temperature (F): 98.3 Height (in): 76 Pulse (bpm): 96 Weight (lbs): 400 Respiratory Rate (breaths/min): 20 Body Mass Index (BMI): 48.7 Blood Pressure (mmHg): 167/118 Reference Range: 80 - 120 mg / dl Electronic Signature(s) Signed: 03/16/2022 5:03:19 PM By: Samuella Bruin Entered By: Samuella Bruin on 03/16/2022 10:21:34

## 2022-03-20 ENCOUNTER — Emergency Department (HOSPITAL_BASED_OUTPATIENT_CLINIC_OR_DEPARTMENT_OTHER): Payer: Medicare HMO

## 2022-03-20 ENCOUNTER — Encounter (HOSPITAL_BASED_OUTPATIENT_CLINIC_OR_DEPARTMENT_OTHER): Payer: Self-pay | Admitting: Emergency Medicine

## 2022-03-20 ENCOUNTER — Other Ambulatory Visit: Payer: Self-pay

## 2022-03-20 ENCOUNTER — Emergency Department (HOSPITAL_BASED_OUTPATIENT_CLINIC_OR_DEPARTMENT_OTHER)
Admission: EM | Admit: 2022-03-20 | Discharge: 2022-03-20 | Disposition: A | Discharge status: Home / Self Care | Payer: Medicare HMO | Attending: Emergency Medicine | Admitting: Emergency Medicine

## 2022-03-20 DIAGNOSIS — J189 Pneumonia, unspecified organism: Secondary | ICD-10-CM | POA: Insufficient documentation

## 2022-03-20 DIAGNOSIS — Z79899 Other long term (current) drug therapy: Secondary | ICD-10-CM | POA: Diagnosis not present

## 2022-03-20 DIAGNOSIS — R000 Tachycardia, unspecified: Secondary | ICD-10-CM | POA: Insufficient documentation

## 2022-03-20 DIAGNOSIS — I10 Essential (primary) hypertension: Secondary | ICD-10-CM | POA: Diagnosis not present

## 2022-03-20 DIAGNOSIS — R109 Unspecified abdominal pain: Secondary | ICD-10-CM | POA: Insufficient documentation

## 2022-03-20 DIAGNOSIS — R079 Chest pain, unspecified: Secondary | ICD-10-CM

## 2022-03-20 LAB — URINALYSIS, ROUTINE W REFLEX MICROSCOPIC
Bilirubin Urine: NEGATIVE
Glucose, UA: NEGATIVE mg/dL
Hgb urine dipstick: NEGATIVE
Ketones, ur: NEGATIVE mg/dL
Nitrite: NEGATIVE
Specific Gravity, Urine: 1.018 (ref 1.005–1.030)
pH: 6.5 (ref 5.0–8.0)

## 2022-03-20 LAB — TROPONIN I (HIGH SENSITIVITY)
Troponin I (High Sensitivity): 8 ng/L (ref <18)
Troponin I (High Sensitivity): 8 ng/L (ref <18)

## 2022-03-20 LAB — CBC
HCT: 46.3 % (ref 39.0–52.0)
Hemoglobin: 14.8 g/dL (ref 13.0–17.0)
MCH: 27.2 pg (ref 26.0–34.0)
MCHC: 32 g/dL (ref 30.0–36.0)
MCV: 85 fL (ref 80.0–100.0)
Platelets: 283 10*3/uL (ref 150–400)
RBC: 5.45 MIL/uL (ref 4.22–5.81)
RDW: 17.2 % — ABNORMAL HIGH (ref 11.5–15.5)
WBC: 9.4 10*3/uL (ref 4.0–10.5)
nRBC: 0 % (ref 0.0–0.2)

## 2022-03-20 LAB — COMPREHENSIVE METABOLIC PANEL
ALT: 10 U/L (ref 0–44)
AST: 14 U/L — ABNORMAL LOW (ref 15–41)
Albumin: 4.4 g/dL (ref 3.5–5.0)
Alkaline Phosphatase: 53 U/L (ref 38–126)
Anion gap: 14 (ref 5–15)
BUN: 15 mg/dL (ref 6–20)
CO2: 21 mmol/L — ABNORMAL LOW (ref 22–32)
Calcium: 10 mg/dL (ref 8.9–10.3)
Chloride: 104 mmol/L (ref 98–111)
Creatinine, Ser: 0.96 mg/dL (ref 0.61–1.24)
GFR, Estimated: >60 mL/min (ref >60)
Glucose, Bld: 106 mg/dL — ABNORMAL HIGH (ref 70–99)
Potassium: 4 mmol/L (ref 3.5–5.1)
Sodium: 139 mmol/L (ref 135–145)
Total Bilirubin: 0.6 mg/dL (ref 0.3–1.2)
Total Protein: 8.3 g/dL — ABNORMAL HIGH (ref 6.5–8.1)

## 2022-03-20 LAB — LIPASE, BLOOD: Lipase: <10 U/L — ABNORMAL LOW (ref 11–51)

## 2022-03-20 MED ORDER — DOXYCYCLINE HYCLATE 100 MG PO CAPS: 100.0000 mg | ORAL_CAPSULE | Freq: Two times a day (BID) | ORAL | 0 refills | Status: AC

## 2022-03-20 MED ORDER — DICYCLOMINE HCL 20 MG PO TABS: 20.0000 mg | ORAL_TABLET | Freq: Two times a day (BID) | ORAL | 0 refills | Status: AC

## 2022-03-20 MED ORDER — SODIUM CHLORIDE 0.9 % IV SOLN
1.0000 g | Freq: Once | INTRAVENOUS | Status: AC
  Administered 2022-03-20: 1 g via INTRAVENOUS
  Filled 2022-03-20: qty 10

## 2022-03-20 MED ORDER — DICYCLOMINE HCL 10 MG/ML IM SOLN
20.0000 mg | Freq: Once | INTRAMUSCULAR | Status: AC
  Administered 2022-03-20: 20 mg via INTRAMUSCULAR
  Filled 2022-03-20: qty 2

## 2022-03-20 MED ORDER — SODIUM CHLORIDE 0.9 % IV BOLUS
1000.0000 mL | Freq: Once | INTRAVENOUS | Status: AC
  Administered 2022-03-20: 1000 mL via INTRAVENOUS

## 2022-03-20 MED ORDER — SODIUM CHLORIDE 0.9 % IV SOLN
100.0000 mg | Freq: Once | INTRAVENOUS | Status: AC
  Administered 2022-03-20: 100 mg via INTRAVENOUS
  Filled 2022-03-20: qty 100

## 2022-03-20 NOTE — Discharge Instructions (Addendum)
Continue at home blood pressure medication as your blood pressure was elevated today in the emergency department.  I have put in a referral for cardiology if you do have close follow-up regarding your symptoms today.  They should call you in 1 to 3 days to set up an appointment.  This is important because you have multiple risk factors for heart issues.  Your x-ray also showed signs for pneumonia.  We will treat you in the hospital with IV antibiotics.  You are to take the doxycycline twice a day for the next 5 days.  Absent this into the pharmacy.  I have also prescribed Bentyl given your significant improvement with the medicine while in the emergency department.  As we discussed at length, please return to the emergency department the worrisome signs and symptoms we discussed become apparent.

## 2022-03-20 NOTE — ED Notes (Signed)
ED Provider at bedside. 

## 2022-03-20 NOTE — ED Triage Notes (Signed)
Patient arrives with complaints of lower abdominal pain x3 days. Patient reports that he started a new cholesterol medicine a few days and it has caused some stomach pain going up to his left chest.

## 2022-03-20 NOTE — ED Provider Notes (Signed)
[facility] EMERGENCY DEPT Provider Note   CSN: [id number] Arrival date & time: 03/20/22  1140     History  Chief Complaint  Patient presents with   Abdominal Pain    Chad [name] is a 56 y.o. male.   Abdominal Pain Associated symptoms: nausea   Associated symptoms: no chest pain, no chills, no cough, no dysuria, no fever, no hematuria, no shortness of breath, no sore throat and no vomiting    56 year old male presents emergency department with complaints of abdominal pain.  Patient states that symptoms began approximately 3 to 4 days ago.  He states that he was put on atorvastatin last Thursday and noted symptoms for 5 days after beginning the medicine.  He stopped the medicine today the symptoms began because of that he was thought it was related to the medicine, but symptoms have persisted since.  Abdominal pain is described as crampy and intermittent.  He has associated nausea that increases with oral intake.  He also notes chest heaviness for the past 2 days with associated shortness of breath.  Chest heaviness is not necessarily related with physical activity.  Denies fever, chills, night sweats, vomiting, urinary symptoms, change in bowel habits, hematochezia, melena.  Denies prior abdominal surgery.  Home Medications Prior to Admission medications   Medication Sig Start Date End Date Taking? Authorizing Provider  dicyclomine (BENTYL) 20 MG tablet Take 1 tablet (20 mg total) by mouth 2 (two) times daily. 03/20/22  Yes Sherian Maroon A, PA  doxycycline (VIBRAMYCIN) 100 MG capsule Take 1 capsule (100 mg total) by mouth 2 (two) times daily. 03/20/22  Yes Sherian Maroon A, PA  amLODipine-benazepril (LOTREL) 10-20 MG capsule Take 1 capsule by mouth daily.    [provider]  benzonatate (TESSALON) 100 MG capsule Take 1 capsule (100 mg total) by mouth every 8 (eight) hours. 06/30/21   Tomi Bamberger, PA-C  cloNIDine (CATAPRES) 0.3 MG tablet Take by mouth.     [provider]  fluticasone (FLONASE) 50 MCG/ACT nasal spray Place 1 spray into both nostrils daily. Patient not taking: Reported on 01/20/2021 11/03/18   Hedges, Tinnie Gens, PA-C  guaiFENesin (MUCINEX) 600 MG 12 hr tablet Take 1 tablet (600 mg total) by mouth 2 (two) times daily. Patient not taking: Reported on 01/20/2021 11/03/18   Hedges, Tinnie Gens, PA-C  hydrochlorothiazide (HYDRODIURIL) 25 MG tablet Take by mouth.    [provider]  omeprazole (PRILOSEC) 40 MG capsule Take 40 mg by mouth daily. 05/17/21   [provider]  terbinafine (LAMISIL) 250 MG tablet Take 1 tablet (250 mg total) by mouth daily. 03/01/22   Vivi Barrack, DPM      Allergies    Patient has no known allergies.    Review of Systems   Review of Systems  Constitutional:  Negative for chills and fever.  HENT:  Negative for ear pain and sore throat.   Eyes:  Negative for pain and visual disturbance.  Respiratory:  Negative for cough and shortness of breath.   Cardiovascular:  Negative for chest pain and palpitations.  Gastrointestinal:  Positive for abdominal pain and nausea. Negative for blood in stool and vomiting.       Abdominal pain is described as periumbilical in nature.  Genitourinary:  Negative for dysuria and hematuria.  Musculoskeletal:  Negative for arthralgias and back pain.  Skin:  Negative for color change and rash.  Neurological:  Negative for seizures and syncope.  All other systems reviewed and are negative.  Physical Exam Updated Vital Signs BP (!) 155/88   Pulse 84   Temp 97.9 F (36.6 C)   Resp 20   Ht 6\' 4"  (1.93 m)   Wt (!) 240.8 kg   SpO2 96%   BMI 64.61 kg/m  Physical Exam Vitals and nursing note reviewed.  Constitutional:      General: He is not in acute distress.    Appearance: He is well-developed.  HENT:     Head: Normocephalic and atraumatic.  Eyes:     Conjunctiva/sclera: Conjunctivae normal.  Cardiovascular:     Rate and Rhythm: Normal rate  and regular rhythm.     Heart sounds: No murmur heard. Pulmonary:     Effort: Pulmonary effort is normal. No respiratory distress.     Breath sounds: Normal breath sounds.  Abdominal:     Palpations: Abdomen is soft.     Tenderness: There is no abdominal tenderness.     Comments: Patient has no abdominal tenderness on exam.  Difficult to perform proper exam given patient's body habitus.  Musculoskeletal:        General: No swelling.     Cervical back: Neck supple.  Skin:    General: Skin is warm and dry.     Capillary Refill: Capillary refill takes less than 2 seconds.  Neurological:     Mental Status: He is alert.  Psychiatric:        Mood and Affect: Mood normal.     ED Results / Procedures / Treatments   Labs (all labs ordered are listed, but only abnormal results are displayed) Labs Reviewed  LIPASE, BLOOD - Abnormal; Notable for the following components:      Result Value   Lipase <10 (*)    All other components within normal limits  COMPREHENSIVE METABOLIC PANEL - Abnormal; Notable for the following components:   CO2 21 (*)    Glucose, Bld 106 (*)    Total Protein 8.3 (*)    AST 14 (*)    All other components within normal limits  CBC - Abnormal; Notable for the following components:   RDW 17.2 (*)    All other components within normal limits  URINALYSIS, ROUTINE W REFLEX MICROSCOPIC - Abnormal; Notable for the following components:   Protein, ur TRACE (*)    Leukocytes,Ua SMALL (*)    All other components within normal limits  TROPONIN I (HIGH SENSITIVITY)  TROPONIN I (HIGH SENSITIVITY)    EKG EKG Interpretation  Date/Time:  Tuesday March 20 2022 12:27:06 EDT Ventricular Rate:  103 PR Interval:  176 QRS Duration: 96 QT Interval:  324 QTC Calculation: 424 R Axis:   53 Text Interpretation: Sinus tachycardia Possible Inferior infarct (cited on or before 20-Mar-2022) When compared with ECG of 03-Nov-2018 11:58, T wave inversion now evident in Lateral leads  Nonspecific Confirmed by 05-Nov-2018 [phone]) on 03/20/2022 4:20:27 PM  Radiology DG Chest Port 1 View  Result Date: 03/20/2022 CLINICAL DATA:  3 day history of pain. EXAM: PORTABLE CHEST 1 VIEW COMPARISON:  11/03/2018 FINDINGS: 1425 hours. Low volumes. Cardiopericardial silhouette is at upper limits of normal for size. There is pulmonary vascular congestion without overt pulmonary edema. Patchy opacity at the right base is likely atelectasis although infection is not excluded. No substantial pleural effusion. IMPRESSION: Low volume film with patchy airspace disease at the right base compatible with atelectasis or pneumonia. Electronically Signed   By: 11/05/2018 M.D.   On: 03/20/2022 14:39    Procedures Procedures  Medications Ordered in ED Medications  dicyclomine (BENTYL) injection 20 mg (20 mg Intramuscular Given 03/20/22 1418)  sodium chloride 0.9 % bolus 1,000 mL (0 mLs Intravenous Stopped 03/20/22 1557)  cefTRIAXone (ROCEPHIN) 1 g in sodium chloride 0.9 % 100 mL IVPB (0 g Intravenous Stopped 03/20/22 1913)  doxycycline (VIBRAMYCIN) 100 mg in sodium chloride 0.9 % 250 mL IVPB (0 mg Intravenous Stopped 03/20/22 1913)    ED Course/ Medical Decision Making/ A&P                           Medical Decision Making Amount and/or Complexity of Data Reviewed Labs: ordered. Radiology: ordered.  Risk Prescription drug management.   This patient presents to the ED for concern of abdominal pain, this involves an extensive number of treatment options, and is a complaint that carries with it a high risk of complications and morbidity.  The differential diagnosis includes The causes of generalized abdominal pain include but are not limited to AAA, mesenteric ischemia, appendicitis, diverticulitis, DKA, gastritis, gastroenteritis, AMI, nephrolithiasis, pancreatitis, peritonitis, adrenal insufficiency,lead poisoning, iron toxicity, intestinal ischemia, constipation, UTI,SBO/LBO, splenic rupture,  biliary disease, IBD, IBS, PUD, or hepatitis.  Co morbidities that complicate the patient evaluation  Obesity, arthritis, hypertension   Additional history obtained:  Additional history obtained from wife who is at bedside External records from outside source obtained and reviewed including prior RDW from 03/01/2019 today indicating 15.8.   Lab Tests:  I Ordered, and personally interpreted labs.  The pertinent results include: Bicarb 21.   Imaging Studies ordered:  CT abdomen pelvis with contrast ordered but patient exceeds weight capacity of CT scanner.  Cardiac Monitoring: / EKG:  The patient was maintained on a cardiac monitor.  I personally viewed and interpreted the cardiac monitored which showed an underlying rhythm of: Sinus tachycardia with nonspecific T wave changes in lateral and inferior leads.   Consultations Obtained:  N/a   Problem List / ED Course / Critical interventions / Medication management  Abdominal pain/community-acquired pneumonia I ordered medication including Rocephin and doxycycline for community-acquired pneumonia coverage, Bentyl for abdominal spasms.   Reevaluation of the patient after these medicines showed that the patient improved I have reviewed the patients home medicines and have made adjustments as needed   Social Determinants of Health:  Obesity, cigarette use, denies illicit drug use.   Test / Admission - Considered:  Community-acquired pneumonia, abdominal pain Vitals signs significant for hypertension with blood pressure 186/89.  Patient did not take at home antihypertensive before arriving to emergency department.  Recommend rechecking once at home medicines are continued. Otherwise within normal range and stable throughout visit. Laboratory/imaging studies significant for: Bicarb 21, UA with trace protein and small leukocyte. Given patient's significant improvement with administration of Bentyl regarding abdominal pains,  patient was agreeable to manage symptoms expectantly outpatient and return to the emergency department for reevaluation in 1 to 2 days or sooner if symptoms get worse.  Patient is advised to return to [facility] as they have a CT machine that will adjust for patient's body habitus. Patient's x-ray significant for infiltrate.  Community-acquired pneumonia coverage given to patient with Rocephin and doxycycline while in the emergency department.  Outpatient antibiotic therapy to be continued for the next 5 days with doxycycline. Regarding patient's chest pain, overall work-up today negative for ACS pathology.  Delta negative troponin Worrisome signs and symptoms were discussed with the patient, and the patient acknowledged understanding to return to the ED  if noticed. Patient was stable upon discharge.          Final Clinical Impression(s) / ED Diagnoses Final diagnoses:  Abdominal pain, unspecified abdominal location  Chest pain, unspecified type  Community acquired pneumonia, unspecified laterality    Rx / DC Orders ED Discharge Orders          Ordered    Ambulatory referral to Cardiology       Comments: If you have not heard from the Cardiology office within the next 72 hours please call [phone].   03/20/22 1651    dicyclomine (BENTYL) 20 MG tablet  2 times daily        03/20/22 1651    doxycycline (VIBRAMYCIN) 100 MG capsule  2 times daily        03/20/22 1716              Peter Garter, [place] 03/20/22 2226    Margarita Grizzle, MD 03/21/22 [phone]

## 2022-03-20 NOTE — ED Notes (Signed)
XR at bedside

## 2022-03-23 ENCOUNTER — Encounter (HOSPITAL_BASED_OUTPATIENT_CLINIC_OR_DEPARTMENT_OTHER): Payer: Medicare HMO | Attending: General Surgery | Admitting: General Surgery

## 2022-03-23 DIAGNOSIS — L97222 Non-pressure chronic ulcer of left calf with fat layer exposed: Secondary | ICD-10-CM | POA: Diagnosis not present

## 2022-03-23 DIAGNOSIS — I872 Venous insufficiency (chronic) (peripheral): Secondary | ICD-10-CM | POA: Insufficient documentation

## 2022-03-23 DIAGNOSIS — I890 Lymphedema, not elsewhere classified: Secondary | ICD-10-CM | POA: Diagnosis present

## 2022-03-23 DIAGNOSIS — L97211 Non-pressure chronic ulcer of right calf limited to breakdown of skin: Secondary | ICD-10-CM | POA: Diagnosis not present

## 2022-03-23 NOTE — Progress Notes (Signed)
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 as he gets home. He should continue using his lymphedema pumps. We will discharge him from the wound care center today. Follow-up as needed. Electronic Signature(s) Signed: 03/23/2022 9:49:09 AM By: Duanne Guess MD FACS Entered By: Duanne Guess on 03/23/2022 09:49:08 -------------------------------------------------------------------------------- HxROS Details Patient Name: Date of Service: [name] [name] 03/23/2022 9:15 A M Medical Record Number: [id number] Patient Account Number: [id number] Date of Birth/Sex: Treating RN: [birth year] (56 y.o. Marlan Palau Primary Care Provider: Other Clinician: Hillery Aldo Referring Provider: Treating Provider/Extender: Mare Ferrari [name]: 17 Information Obtained From Patient Constitutional Symptoms (General Health) Medical History: Past Medical History Notes: obestiy Eyes Medical History: Negative for: Cataracts; Glaucoma; Optic Neuritis Ear/Nose/Mouth/Throat Medical History: Negative for: Chronic sinus problems/congestion; Middle ear  problems Hematologic/Lymphatic Medical History: Negative for: Anemia; Hemophilia; Human Immunodeficiency Virus; Lymphedema; Sickle Cell Disease Respiratory Medical History: Negative for: Aspiration; Asthma; Chronic Obstructive Pulmonary Disease (COPD); Pneumothorax; Sleep Apnea; Tuberculosis Cardiovascular Medical History: Positive for: Hypertension; Peripheral Venous Disease Negative for: Angina; Arrhythmia; Congestive Heart Failure; Coronary Artery Disease; Deep Vein Thrombosis; Hypotension; Myocardial Infarction; Peripheral Arterial Disease; Phlebitis; Vasculitis Gastrointestinal Medical History: Negative for: Cirrhosis ; Colitis; Crohns; Hepatitis A; Hepatitis B; Hepatitis C Endocrine Medical History: Negative for: Type I Diabetes; Type II Diabetes Genitourinary Medical History: Negative for: End Stage Renal Disease Immunological Medical History: Negative for: Lupus Erythematosus; Raynauds; Scleroderma Integumentary (Skin) Medical History: Negative for: History of Burn Musculoskeletal Medical History: Positive for: Osteoarthritis - left hip Negative for: Gout; Rheumatoid Arthritis; Osteomyelitis Neurologic Medical History: Negative for: Dementia; Neuropathy; Quadriplegia; Paraplegia; Seizure Disorder Oncologic Medical History: Negative for: Received Chemotherapy; Received Radiation Psychiatric Medical History: Negative for: Anorexia/bulimia; Confinement Anxiety Immunizations Pneumococcal Vaccine: Received Pneumococcal Vaccination: No Implantable Devices None Family and Social History Cancer: Yes - Mother,Father; Never smoker; Marital Status - Married; Alcohol Use: Rarely; Drug Use: No History; Caffeine Use: Rarely; Financial Concerns: No; Food, Clothing or Shelter Needs: No; Support System Lacking: No; Transportation Concerns: No Electronic Signature(s) Signed: 03/23/2022 9:52:22 AM By: Duanne Guess MD FACS Signed: 03/23/2022 5:20:13 PM By: Gelene Mink By: Duanne Guess on 03/23/2022 09:46:58 -------------------------------------------------------------------------------- SuperBill Details Patient Name: Date of Service: [name] [facility] 03/23/2022 Medical Record Number: [id number] Patient Account Number: [id number] Date of  Birth/Sex: Treating RN: [birth year] (56 y.o. 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 Facility Procedures CPT4 Code: 02774128 Description: 99213 - WOUND CARE VISIT-LEV 3 EST PT Modifier: Quantity: 1 Physician Procedures : CPT4 Code Description Modifier [id number] 99213 - WC PHYS LEVEL 3 - EST PT ICD-10 Diagnosis Description I89.0 Lymphedema, not elsewhere classified L97.222 Non-pressure chronic ulcer of left calf with fat layer exposed L97.211 Non-pressure chronic ulcer of  right calf limited to breakdown of skin Quantity: 1 Electronic Signature(s) Signed: 03/23/2022 9:52:22 AM By: Duanne Guess MD FACS Signed: 03/23/2022 5:20:13 PM By: Samuella Bruin Previous Signature: 03/23/2022 9:49:24 AM Version By: Duanne Guess MD FACS Entered By: Samuella Bruin on 03/23/2022 09:49:33

## 2022-03-23 NOTE — Progress Notes (Signed)
WALI, REINHEIMER ([id number]) Visit Report for 03/23/2022 Arrival Information Details Patient Name: Date of Service: [name] [facility] 03/23/2022 9:15 A M Medical Record Number: [id number] Patient Account Number: [id number] Date of Birth/Sex: Treating RN: April 29, 1966 (56 y.o. Marlan Palau Primary Care Ayesha Markwell: Hillery Aldo Other Clinician: Referring Zhuri Krass: Treating [place] Grayer/Extender: Mare Ferrari [name]: 17 Visit Information History Since Last Visit Added or deleted any medications: No Patient Arrived: Ambulatory Any new allergies or adverse reactions: No Arrival Time: 09:19 Had a fall or experienced change in No Accompanied By: self activities of daily living that may affect Transfer Assistance: None risk of falls: Patient Identification Verified: Yes Signs or symptoms of abuse/neglect since last visito No Secondary Verification Process Completed: Yes Hospitalized since last visit: No Patient Requires Transmission-Based Precautions: No Implantable device outside of the clinic excluding No Patient Has Alerts: Yes cellular tissue based products placed in the center Patient Alerts: ABI non compressible bil since last visit: Has Dressing in Place as Prescribed: Yes Has Compression in Place as Prescribed: Yes Pain Present Now: No Electronic Signature(s) Signed: 03/23/2022 5:20:13 PM By: Samuella Bruin Entered By: Samuella Bruin on 03/23/2022 09:22:10 -------------------------------------------------------------------------------- Clinic Level of Care Assessment Details Patient Name: Date of Service: [name] [facility] 03/23/2022 9:15 A M Medical Record Number: [id number] Patient Account Number: [id number] Date of Birth/Sex: Treating RN: [birth year] (56 y.o. Marlan Palau Primary Care Wai Litt: Hillery Aldo Other Clinician: Referring Add Dinapoli: Treating Fred Franzen/Extender: Mare Ferrari [name]:  17 Clinic Level of Care Assessment Items TOOL 4 Quantity Score X- 1 0 Use when only an EandM is performed on FOLLOW-UP visit ASSESSMENTS - Nursing Assessment / Reassessment X- 1 10 Reassessment of Co-morbidities (includes updates in patient status) X- 1 5 Reassessment of Adherence to Treatment Plan ASSESSMENTS - Wound and Skin A ssessment / Reassessment X - Simple Wound Assessment / Reassessment - one wound 1 5 []  - 0 Complex Wound Assessment / Reassessment - multiple wounds []  - 0 Dermatologic / Skin Assessment (not related to wound area) ASSESSMENTS - Focused Assessment X- 1 5 Circumferential Edema Measurements - multi extremities []  - 0 Nutritional Assessment / Counseling / Intervention X- 1 5 Lower Extremity Assessment (monofilament, tuning fork, pulses) []  - 0 Peripheral Arterial Disease Assessment (using hand held doppler) ASSESSMENTS - Ostomy and/or Continence Assessment and Care []  - 0 Incontinence Assessment and Management []  - 0 Ostomy Care Assessment and Management (repouching, etc.) PROCESS - Coordination of Care X - Simple Patient / Family Education for ongoing care 1 15 []  - 0 Complex (extensive) Patient / Family Education for ongoing care X- 1 10 Staff obtains , Records, T Results / Process Orders est []  - 0 Staff telephones HHA, Nursing Homes / Clarify orders / etc []  - 0 Routine Transfer to another Facility (non-emergent condition) []  - 0 Routine Hospital Admission (non-emergent condition) []  - 0 New Admissions / / Ordering NPWT Apligraf, etc. , []  - 0 Emergency Hospital Admission (emergent condition) X- 1 10 Simple Discharge Coordination []  - 0 Complex (extensive) Discharge Coordination PROCESS - Special Needs []  - 0 Pediatric / Minor Patient Management []  - 0 Isolation Patient Management []  - 0 Hearing / Language / Visual special needs []  - 0 Assessment of Community assistance (transportation, D/C  planning, etc.) []  - 0 Additional assistance / Altered mentation []  - 0 Support Surface(s) Assessment (bed, cushion, seat, etc.) INTERVENTIONS - Wound Cleansing / Measurement X - Simple Wound Cleansing -  one wound 1 5 []  - 0 Complex Wound Cleansing - multiple wounds []  - 0 Wound Imaging (photographs - any number of wounds) []  - 0 Wound Tracing (instead of photographs) X- 1 5 Simple Wound Measurement - one wound []  - 0 Complex Wound Measurement - multiple wounds INTERVENTIONS - Wound Dressings []  - 0 Small Wound Dressing one or multiple wounds []  - 0 Medium Wound Dressing one or multiple wounds []  - 0 Large Wound Dressing one or multiple wounds []  - 0 Application of Medications - topical []  - 0 Application of Medications - injection INTERVENTIONS - Miscellaneous []  - 0 External ear exam []  - 0 Specimen Collection (cultures, biopsies, blood, body fluids, etc.) []  - 0 Specimen(s) / Culture(s) sent or taken to Lab for analysis []  - 0 Patient Transfer (multiple staff / / Similar devices) []  - 0 Simple Staple / Suture removal (25 or less) []  - 0 Complex Staple / Suture removal (26 or more) []  - 0 Hypo / Hyperglycemic Management (close monitor of Blood Glucose) []  - 0 Ankle / Brachial Index (ABI) - do not check if billed separately X- 1 5 Vital Signs Has the patient been seen at the hospital within the last three years: Yes Total Score: 80 Level Of Care: New/Established - Level 3 Electronic Signature(s) Signed: 03/23/2022 5:20:13 PM By: Entered By: on 03/23/2022 09:49:27 -------------------------------------------------------------------------------- Encounter Discharge Information Details Patient Name: Date of Service: [facility] 03/23/2022 9:15 A M Medical Record Number: Patient Account Number: Date of Birth/Sex: Treating RN: [birth year] (56 y.o. Primary Care Melicia Esqueda:  Nurse, adult Other Clinician: Referring Gianlucca Szymborski: Treating Valente Fosberg/Extender: [name]: 17 Encounter Discharge Information Items Discharge Condition: Stable Ambulatory Status: Ambulatory Discharge Destination: Home Transportation: Private Auto Accompanied By: self Schedule Follow-up Appointment: Yes Clinical Summary of Care: Patient Declined Electronic Signature(s) Signed: 03/23/2022 5:20:13 PM By: By: 05/24/2022 on 03/23/2022 09:49:57 -------------------------------------------------------------------------------- Lower Extremity Assessment Details Patient Name: Date of Service: Samuella Bruin [facility] 03/23/2022 9:15 A M Medical Record Number: [facility] Patient Account Number: 05/24/2022 Date of Birth/Sex: Treating RN: Jun 12, 1966 (56 y.o. 01/21/1966 Primary Care Shada Nienaber: 59 Other Clinician: Referring Greycen Felter: Treating Rahmah Mccamy/Extender: Marlan Palau [name]: 17 Edema Assessment Assessed: [Left: No] [Right: No] Edema: [Left: Yes] [Right: Yes] Calf Left: Right: Point of Measurement: 40 cm From Medial Instep 49.2 cm 48.2 cm Ankle Left: Right: Point of Measurement: 10 cm From Medial Instep 26.2 cm 26 cm Vascular Assessment Pulses: Dorsalis Pedis Palpable: [Left:Yes] [Right:Yes] Electronic Signature(s) Signed: 03/23/2022 5:20:13 PM By: Mare Ferrari Entered By: 18 on 03/23/2022 09:30:25 -------------------------------------------------------------------------------- Multi Wound Chart Details Patient Name: Date of Service: Gelene Mink [facility] 03/23/2022 9:15 A M Medical Record Number: [name] Patient Account Number: [facility] Date of Birth/Sex: Treating RN: [birth year] (56 y.o. [id number] Primary Care Tiera Mensinger: 01/21/1966 Other Clinician: Referring Yalanda Soderman: Treating Ryenne Lynam/Extender: 59 [name]: 17 Vital Signs Height(in): 76 Pulse(bpm): 106 Weight(lbs): 400 Blood Pressure(mmHg): 191/95 Body Mass Index(BMI): 48.7 Temperature(F): 98.4 Respiratory Rate(breaths/min): 20 Photos: Right, Lateral Lower Leg Right, Anterior Lower Leg Left, Lateral Lower Leg Wound Location: Blister Blister Blister Wounding Event: Venous Leg Ulcer Venous Leg Ulcer Venous Leg Ulcer Primary Etiology: Hypertension, Peripheral Venous Hypertension, Peripheral Venous Hypertension, Peripheral Venous Comorbid History: Disease, Osteoarthritis Disease, Osteoarthritis Disease, Osteoarthritis 02/23/2022 02/23/2022 02/23/2022 Date Acquired: 2 2 2  Weeks of Treatment: Open Open Open Wound Status: No No No Wound Recurrence: 0x0x0 0x0x0 0x0x0 Measurements L x W x D (cm) 0 0 0 A (cm) : rea 0 0 0 Volume (cm) : 100.00% 100.00% 100.00% % Reduction in Area: 100.00% 100.00% 100.00% % Reduction in Volume: Full Thickness Without Exposed Full Thickness Without Exposed Full Thickness Without Exposed Classification: Support Structures Support Structures Support Structures None Present None Present None Present Exudate Amount: Distinct, outline attached Distinct, outline attached Distinct, outline attached Wound Margin: None Present (0%) None Present (0%) None Present (0%) Granulation Amount: None Present (0%) None Present (0%) None Present (0%) Necrotic Amount: Fascia: No Fascia: No Fascia: No Exposed Structures: Fat Layer (Subcutaneous Tissue): No Fat Layer (Subcutaneous Tissue): No Fat Layer (Subcutaneous Tissue): No Tendon: No Tendon: No Tendon: No Muscle: No Muscle: No Muscle: No Joint: No Joint: No Joint: No Bone: No Bone: No Bone: No Large (67-100%) Large (67-100%) Large (67-100%) Epithelialization: Treatment Notes Electronic Signature(s) Signed: 03/23/2022 9:46:25 AM By: Duanne Guess MD FACS Signed: 03/23/2022 5:20:13 PM By: Samuella Bruin Entered By:  Duanne Guess on 03/23/2022 09:46:25 -------------------------------------------------------------------------------- Multi-Disciplinary Care Plan Details Patient Name: Date of Service: [name] [facility] 03/23/2022 9:15 A M Medical Record Number: [id number] Patient Account Number: [id number] Date of Birth/Sex: Treating RN: 1966/07/19 (56 y.o. Marlan Palau Primary Care Tamella Tuccillo: Hillery Aldo Other Clinician: Referring Kellan Raffield: Treating Marqual Mi/Extender: Mare Ferrari [name]: 17 Multidisciplinary Care Plan reviewed with physician Active Inactive Electronic Signature(s) Signed: 03/23/2022 5:20:13 PM By: Gelene Mink By: Samuella Bruin on 03/23/2022 09:51:46 -------------------------------------------------------------------------------- Pain Assessment Details Patient Name: Date of Service: [name] [facility] 03/23/2022 9:15 A M Medical Record Number: [id number] Patient Account Number: [id number] Date of Birth/Sex: Treating RN: Jul 04, 1966 (56 y.o. Marlan Palau Primary Care Cigi Bega: Hillery Aldo Other Clinician: Referring Edgerrin Correia: Treating Tiya Schrupp/Extender: Mare Ferrari [name]: 17 Active Problems Location of Pain Severity and Description of Pain Patient Has Paino No Site Locations Rate the pain. Current Pain Level: 0 Pain Management and Medication Current Pain Management: Electronic Signature(s) Signed: 03/23/2022 5:20:13 PM By: Samuella Bruin Entered By: Samuella Bruin on 03/23/2022 09:23:45 -------------------------------------------------------------------------------- Patient/Caregiver Education Details Patient Name: Date of Service: [name] [name] 7/7/2023andnbsp9:15 A M Medical Record Number: [id number] Patient Account Number: [id number] Date of Birth/Gender: Treating RN: [birth year] (56 y.o. Marlan Palau Primary Care Physician: Hillery Aldo Other  Clinician: Referring Physician: Treating Physician/Extender: Mare Ferrari [name]: 17 Education Assessment Education Provided To: Patient Education Topics Provided Wound/Skin Impairment: Methods: Explain/Verbal Responses: Reinforcements needed, State content correctly Electronic Signature(s) Signed: 03/23/2022 5:20:13 PM By: Samuella Bruin Entered By: Samuella Bruin on 03/23/2022 09:31:56 -------------------------------------------------------------------------------- Wound Assessment Details Patient Name: Date of Service: [name] [facility] 03/23/2022 9:15 A M Medical Record Number: [id number] Patient Account Number: [id number] Date of Birth/Sex: Treating RN: [birth year] (56 y.o. Marlan Palau Primary Care Tatsuo Musial: Hillery Aldo Other Clinician: Referring Warda Mcqueary: Treating Kristopher Delk/Extender: Mare Ferrari [name]: 17 Wound Status Wound Number: [id number] Primary Etiology: Venous Leg Ulcer Wound Location: Right, Lateral Lower Leg Wound Status: Open Wounding Event: Blister Comorbid History: Hypertension, Peripheral Venous Disease, Osteoarthritis Date Acquired: 02/23/2022 Weeks Of Treatment: 2 Clustered Wound: No Photos Wound Measurements Length: (cm) Width: (cm) Depth: (cm) Area: (cm) Volume: (cm) 0 % Reduction in Area: 100% 0 % Reduction in Volume: 100% 0 Epithelialization: Large (67-100%) 0 Tunneling: No 0 Undermining: No Wound Description Classification: Full  Thickness Without Exposed Support Structures Wound Margin: Distinct, outline attached Exudate Amount: None Present Foul Odor After Cleansing: No Slough/Fibrino No Wound Bed Granulation Amount: None Present (0%) Exposed Structure Necrotic Amount: None Present (0%) Fascia Exposed: No Fat Layer (Subcutaneous Tissue) Exposed: No Tendon Exposed: No Muscle Exposed: No Joint Exposed: No Bone Exposed: No Electronic Signature(s) Signed:  03/23/2022 5:20:13 PM By: Samuella Bruin Entered By: Samuella Bruin on 03/23/2022 09:33:36 -------------------------------------------------------------------------------- Wound Assessment Details Patient Name: Date of Service: [name] [facility] 03/23/2022 9:15 A M Medical Record Number: [id number] Patient Account Number: [id number] Date of Birth/Sex: Treating RN: 1966/08/22 (56 y.o. Marlan Palau Primary Care Ilayda Toda: Hillery Aldo Other Clinician: Referring Terianne Thaker: Treating Karl Erway/Extender: Mare Ferrari [name]: 17 Wound Status Wound Number: 20 Primary Etiology: Venous Leg Ulcer Wound Location: Right, Anterior Lower Leg Wound Status: Open Wounding Event: Blister Comorbid History: Hypertension, Peripheral Venous Disease, Osteoarthritis Date Acquired: 02/23/2022 Weeks Of Treatment: 2 Clustered Wound: No Photos Wound Measurements Length: (cm) Width: (cm) Depth: (cm) Area: (cm) Volume: (cm) 0 % Reduction in Area: 100% 0 % Reduction in Volume: 100% 0 Epithelialization: Large (67-100%) 0 Tunneling: No 0 Undermining: No Wound Description Classification: Full Thickness Without Exposed Support Structures Wound Margin: Distinct, outline attached Exudate Amount: None Present Foul Odor After Cleansing: No Slough/Fibrino No Wound Bed Granulation Amount: None Present (0%) Exposed Structure Necrotic Amount: None Present (0%) Fascia Exposed: No Fat Layer (Subcutaneous Tissue) Exposed: No Tendon Exposed: No Muscle Exposed: No Joint Exposed: No Bone Exposed: No Electronic Signature(s) Signed: 03/23/2022 5:20:13 PM By: Samuella Bruin Entered By: Samuella Bruin on 03/23/2022 09:34:03 -------------------------------------------------------------------------------- Wound Assessment Details Patient Name: Date of Service: [name] [facility] 03/23/2022 9:15 A M Medical Record Number: [id number] Patient Account Number:  [id number] Date of Birth/Sex: Treating RN: July 21, 1966 (56 y.o. Marlan Palau Primary Care Bethannie Iglehart: Hillery Aldo Other Clinician: Referring Nallely Yost: Treating Johnavon Mcclafferty/Extender: Mare Ferrari [name]: 17 Wound Status Wound Number: 21 Primary Etiology: Venous Leg Ulcer Wound Location: Left, Lateral Lower Leg Wound Status: Open Wounding Event: Blister Comorbid History: Hypertension, Peripheral Venous Disease, Osteoarthritis Date Acquired: 02/23/2022 Weeks Of Treatment: 2 Clustered Wound: No Photos Wound Measurements Length: (cm) Width: (cm) Depth: (cm) Area: (cm) Volume: (cm) 0 % Reduction in Area: 100% 0 % Reduction in Volume: 100% 0 Epithelialization: Large (67-100%) 0 Tunneling: No 0 Undermining: No Wound Description Classification: Full Thickness Without Exposed Support Structures Wound Margin: Distinct, outline attached Exudate Amount: None Present Foul Odor After Cleansing: No Slough/Fibrino No Wound Bed Granulation Amount: None Present (0%) Exposed Structure Necrotic Amount: None Present (0%) Fascia Exposed: No Fat Layer (Subcutaneous Tissue) Exposed: No Tendon Exposed: No Muscle Exposed: No Joint Exposed: No Bone Exposed: No Electronic Signature(s) Signed: 03/23/2022 5:20:13 PM By: Samuella Bruin Entered By: Samuella Bruin on 03/23/2022 09:34:36 -------------------------------------------------------------------------------- Vitals Details Patient Name: Date of Service: [name] [name] 03/23/2022 9:15 A M Medical Record Number: [id number] Patient Account Number: [id number] Date of Birth/Sex: Treating RN: [birth year] (56 y.o. Marlan Palau Primary Care Latisa Belay: Hillery Aldo Other Clinician: Referring Adeli Frost: Treating Hezakiah Champeau/Extender: Mare Ferrari [name]: 17 Vital Signs Time Taken: 09:22 Temperature (F): 98.4 Height (in): 76 Pulse (bpm): 106 Weight (lbs):  400 Respiratory Rate (breaths/min): 20 Body Mass Index (BMI): 48.7 Blood Pressure (mmHg): 191/95 Reference Range: 80 - 120 mg / dl Electronic Signature(s) Signed: 03/23/2022 5:20:13 PM By: Samuella Bruin Entered By: Samuella Bruin on 03/23/2022 62:83:66

## 2022-04-18 ENCOUNTER — Ambulatory Visit: Payer: Medicare HMO | Admitting: Internal Medicine

## 2022-04-18 NOTE — Progress Notes (Deleted)
Cardiology Office Note:    Date:  04/18/2022   ID:  Chad [name], DOB 1966-06-09, MRN [id number]  PCP:  Hillery Aldo, [name]   [facility] Providers Cardiologist:  None { Click to update primary [name],subspecialty [name] or APP then REFRESH:1}    Referring [name]: Hillery Aldo, [name]   CC: *** Consulted for the evaluation of chest pain at the behest of Chad [name]  History of Present Illness:    Chad [name] is a 56 y.o. male with a hx of HTN, who presents with chest pain.  Patient notes that he is feeling ***.   Was last feeling well ***. Able to ***  Has had no chest pain, chest pressure, chest tightness, chest stinging ***.  Discomfort occurs with ***, worsens with ***, and improves with ***.    Patient exertion notable for *** with *** and feels no symptoms.    No shortness of breath, DOE ***.  No PND or orthopnea***.  No weight gain***, leg swelling ***, or abdominal swelling***.  No syncope or near syncope ***. Notes *** no palpitations or funny heart beats.     Patient reports prior cardiac testing including ***  No history of ***pre-eclampsia, gestation HTN or gestational DM.  No Fen-Phen or drug use***.  Ambulatory BP ***.   Past Medical History:  Diagnosis Date   Arthritis    Hypertension     No past surgical history on file.  Current Medications: No outpatient medications have been marked as taking for the 04/18/22 encounter (Appointment) with Chad [name].     Allergies:   Patient has no known allergies.   Social History   Socioeconomic History   Marital status: Married    Spouse name: Not on file   Number of children: Not on file   Years of education: Not on file   Highest education level: Not on file  Occupational History   Not on file  Tobacco Use   Smoking status: Some Days    Types: Cigarettes   Smokeless tobacco: Never  Substance and Sexual Activity   Alcohol use: Yes    Comment: occ   Drug use: Yes    Types:  Marijuana    Comment: occasionally   Sexual activity: Not on file  Other Topics Concern   Not on file  Social History Narrative   Not on file   Social Determinants of Health   Financial Resource Strain: Not on file  Food Insecurity: Not on file  Transportation Needs: Not on file  Physical Activity: Not on file  Stress: Not on file  Social Connections: Not on file     Family History: The patient's family history includes Dementia in his father and mother; Prostate cancer in his father; Thyroid cancer in his mother.  ROS:   Please see the history of present illness.     All other systems reviewed and are negative.  EKGs/Labs/Other Studies Reviewed:    The following studies were reviewed today:   EKG:  EKG is *** ordered today.  The ekg ordered today demonstrates *** 04/18/22: ***  No prior cardiac imaging at [facility].   Recent Labs: 03/20/2022: ALT 10; BUN 15; Creatinine, Ser 0.96; Hemoglobin 14.8; Platelets 283; Potassium 4.0; Sodium 139  Recent Lipid Panel No results found for: "CHOL", "TRIG", "HDL", "CHOLHDL", "VLDL", "LDLCALC", "LDLDIRECT"  Physical Exam:    VS:  There were no vitals taken for this visit.    Wt Readings from Last 3 Encounters:  03/20/22 (!) 530 lb 12.1 oz (240.8 kg)  01/20/21 (!) 537 lb (243.6 kg)     Gen: *** distress, *** obese/well nourished/malnourished   Neck: No JVD, *** carotid bruit Ears: *** Frank Sign Cardiac: No Rubs or Gallops, *** Murmur, ***cardia, *** radial pulses Respiratory: Clear to auscultation bilaterally, *** effort, ***  respiratory rate GI: Soft, nontender, non-distended *** MS: No *** edema; *** moves all extremities Integument: Skin feels *** Neuro:  At time of evaluation, alert and oriented to person/place/time/situation *** Psych: Normal affect, patient feels ***   ASSESSMENT:    No diagnosis found. PLAN:    Precordial Pain HTN - The patient presents with cardiac/possibly cardiac/non-cardiac pain *** - ***  Criteria to defer EKG  stress include without evidence of accessory pathway, ventricular pacing, digoxin use, LBBB, or baseline ST changes.  The ASCVD Risk score (Arnett DK, et al., 2019) failed to calculate for the following reasons:   Cannot find a previous HDL lab   Cannot find a previous total cholesterol lab  - Additional Blood Work:  Lipids ***  - *** ASA 81 mg QD, current statin, and beta blocker therapies ***  - Sublingual nitroglycerin as need for chest pain. *** - Would recommend an echocardiogram to assess LVEF and exclude WMA.   - Would recommend CCTA with possible FFR as needed to exclude obstructive CAD and to assess for non-obstructive CAD requiring secondary prevention - BMI in *** so high tube current may be necessary, *** no hx of AF ivabradine - resting heart rate is ***50-60 bpm, given BP room with ddd Metoprolol 25 mg PO 90 min prior to scan - resting heart rate is ***60-65 bpm, given BP room with add Metoprolol  50 mg PO 90 min prior to scan - resting heart rate is ***65-80 bpm, given BP room with add Metoprolol  100 mg PO 90 min prior to scan - - resting heart rate is ***> 80bpm, given BP room with  add ivabradine 15 mg PO 120 min prior to scan  - GFR is *** necessitating contrast limit of ***  - Would recommend exercise/pharmacological ***nuclear medicine stress test  - PET study - (NPO at midnight/hold beta blocker in AM); discussed risks, benefits, and alternatives of the diagnostic procedure including chest pain, arrhythmia, and death.  Patient amenable for testing.  Four months me or APP     {Are you ordering a CV Procedure (e.g. stress test, cath, DCCV, TEE, etc)?   Press F2        :[id number]}    Medication Adjustments/Labs and Tests Ordered: Current medicines are reviewed at length with the patient today.  Concerns regarding medicines are outlined above.  No orders of the defined types were placed in this encounter.  No orders of the defined types were  placed in this encounter.   There are no Patient Instructions on file for this visit.   Signed, Chad [name]  04/18/2022 8:14 AM    [facility]

## 2022-05-23 NOTE — Progress Notes (Deleted)
Cardiology Office Note:    Date:  05/23/2022   ID:  Chad [name], DOB 16-Mar-1966, MRN [id number]  PCP:  Hillery Aldo, NP  Cardiologist:  None   Referring MD: Peter Garter, PA   No chief complaint on file.   History of Present Illness:    Chad [name] is a 56 y.o. male with a hx of primary hypertension, lower extremity swelling, who is referred for cardiology evaluation.  Today is a prior history of chest discomfort.  ***  Past Medical History:  Diagnosis Date   Abdominal pain    Arthritis    Chest pain    Edema of leg    HTN (hypertension)    Hypertension    Leg swelling    Obesity    Pyelonephritis    URI (upper respiratory infection)     No past surgical history on file.  Current Medications: No outpatient medications have been marked as taking for the 05/24/22 encounter (Appointment) with [name], MD.     Allergies:   Patient has no known allergies.   Social History   Socioeconomic History   Marital status: Married    Spouse name: Not on file   Number of children: Not on file   Years of education: Not on file   Highest education level: Not on file  Occupational History   Not on file  Tobacco Use   Smoking status: Some Days    Types: Cigarettes   Smokeless tobacco: Never  Substance and Sexual Activity   Alcohol use: Yes    Comment: occ   Drug use: Yes    Types: Marijuana    Comment: occasionally   Sexual activity: Not on file  Other Topics Concern   Not on file  Social History Narrative   Not on file   Social Determinants of Health   Financial Resource Strain: Not on file  Food Insecurity: Not on file  Transportation Needs: Not on file  Physical Activity: Not on file  Stress: Not on file  Social Connections: Not on file     Family History: The patient's family history includes Dementia in his father and mother; Prostate cancer in his father; Thyroid cancer in his mother.  ROS:   Please see the history of present  illness.    *** All other systems reviewed and are negative.  EKGs/Labs/Other Studies Reviewed:    The following studies were reviewed today: ***  EKG:  EKG 03/22/2022 demonstrates sinus rhythm, small inferior Q waves, and ST T wave abnormality diffusely on the EKG.  Recent Labs: 03/20/2022: ALT 10; BUN 15; Creatinine, Ser 0.96; Hemoglobin 14.8; Platelets 283; Potassium 4.0; Sodium 139  Recent Lipid Panel No results found for: "CHOL", "TRIG", "HDL", "CHOLHDL", "VLDL", "LDLCALC", "LDLDIRECT"  Physical Exam:    VS:  There were no vitals taken for this visit.    Wt Readings from Last 3 Encounters:  03/20/22 (!) 530 lb 12.1 oz (240.8 kg)  01/20/21 (!) 537 lb (243.6 kg)     GEN: ***. No acute distress HEENT: Normal NECK: No JVD. LYMPHATICS: No lymphadenopathy CARDIAC: *** murmur. RRR *** gallop, or edema. VASCULAR: *** Normal Pulses. No bruits. RESPIRATORY:  Clear to auscultation without rales, wheezing or rhonchi  ABDOMEN: Soft, non-tender, non-distended, No pulsatile mass, MUSCULOSKELETAL: No deformity  SKIN: Warm and dry NEUROLOGIC:  Alert and oriented x 3 PSYCHIATRIC:  Normal affect   ASSESSMENT:    1. Varicose veins of bilateral lower extremities with other complications  2. Leg swelling   3. Chest pain of uncertain etiology   4. Localized swelling of left lower extremity    PLAN:    In order of problems listed above:  ***   Medication Adjustments/Labs and Tests Ordered: Current medicines are reviewed at length with the patient today.  Concerns regarding medicines are outlined above.  No orders of the defined types were placed in this encounter.  No orders of the defined types were placed in this encounter.   There are no Patient Instructions on file for this visit.   Signed, Lesleigh Noe, MD  05/23/2022 6:23 PM    [facility]

## 2022-05-24 ENCOUNTER — Ambulatory Visit: Payer: Medicare HMO | Admitting: Interventional Cardiology

## 2022-05-24 DIAGNOSIS — M7989 Other specified soft tissue disorders: Secondary | ICD-10-CM

## 2022-05-24 DIAGNOSIS — R2242 Localized swelling, mass and lump, left lower limb: Secondary | ICD-10-CM

## 2022-05-24 DIAGNOSIS — R079 Chest pain, unspecified: Secondary | ICD-10-CM

## 2022-05-24 DIAGNOSIS — I83893 Varicose veins of bilateral lower extremities with other complications: Secondary | ICD-10-CM

## 2022-05-31 ENCOUNTER — Ambulatory Visit: Payer: Medicare HMO | Admitting: Podiatry

## 2022-10-16 ENCOUNTER — Ambulatory Visit: Payer: Medicare PPO

## 2022-10-31 ENCOUNTER — Ambulatory Visit: Payer: Medicare PPO | Attending: Foot & Ankle Surgery

## 2022-10-31 DIAGNOSIS — B951 Streptococcus, group B, as the cause of diseases classified elsewhere: Secondary | ICD-10-CM | POA: Insufficient documentation

## 2022-10-31 DIAGNOSIS — B957 Other staphylococcus as the cause of diseases classified elsewhere: Secondary | ICD-10-CM | POA: Insufficient documentation

## 2022-10-31 DIAGNOSIS — B964 Proteus (mirabilis) (morganii) as the cause of diseases classified elsewhere: Secondary | ICD-10-CM | POA: Insufficient documentation

## 2022-10-31 DIAGNOSIS — I10 Essential (primary) hypertension: Secondary | ICD-10-CM | POA: Insufficient documentation

## 2022-10-31 DIAGNOSIS — L97222 Non-pressure chronic ulcer of left calf with fat layer exposed: Secondary | ICD-10-CM | POA: Insufficient documentation

## 2022-10-31 DIAGNOSIS — L97212 Non-pressure chronic ulcer of right calf with fat layer exposed: Secondary | ICD-10-CM | POA: Insufficient documentation

## 2022-10-31 DIAGNOSIS — L97219 Non-pressure chronic ulcer of right calf with unspecified severity: Secondary | ICD-10-CM | POA: Insufficient documentation

## 2022-10-31 DIAGNOSIS — Z6841 Body Mass Index (BMI) 40.0 and over, adult: Secondary | ICD-10-CM | POA: Insufficient documentation

## 2022-10-31 DIAGNOSIS — F17210 Nicotine dependence, cigarettes, uncomplicated: Secondary | ICD-10-CM | POA: Insufficient documentation

## 2022-11-05 ENCOUNTER — Other Ambulatory Visit: Payer: Self-pay | Admitting: Nurse Practitioner

## 2022-11-05 MED ORDER — LEVOFLOXACIN 500 MG PO TABS
500.0000 mg | ORAL_TABLET | Freq: Every day | ORAL | 0 refills | Status: AC
Start: 2022-11-05 — End: 2022-11-15

## 2022-11-07 ENCOUNTER — Ambulatory Visit: Payer: Medicare PPO

## 2022-11-07 ENCOUNTER — Emergency Department
Admission: EM | Admit: 2022-11-07 | Discharge: 2022-11-07 | Disposition: A | Discharge status: Home / Self Care | Payer: Medicare PPO | Attending: Emergency Medicine | Admitting: Emergency Medicine

## 2022-11-07 DIAGNOSIS — Z1152 Encounter for screening for COVID-19: Secondary | ICD-10-CM | POA: Insufficient documentation

## 2022-11-07 DIAGNOSIS — L97219 Non-pressure chronic ulcer of right calf with unspecified severity: Secondary | ICD-10-CM

## 2022-11-07 DIAGNOSIS — F17210 Nicotine dependence, cigarettes, uncomplicated: Secondary | ICD-10-CM | POA: Insufficient documentation

## 2022-11-07 DIAGNOSIS — Z6841 Body Mass Index (BMI) 40.0 and over, adult: Secondary | ICD-10-CM | POA: Insufficient documentation

## 2022-11-07 DIAGNOSIS — L97222 Non-pressure chronic ulcer of left calf with fat layer exposed: Secondary | ICD-10-CM | POA: Insufficient documentation

## 2022-11-07 DIAGNOSIS — I890 Lymphedema, not elsewhere classified: Secondary | ICD-10-CM | POA: Insufficient documentation

## 2022-11-07 DIAGNOSIS — I10 Essential (primary) hypertension: Secondary | ICD-10-CM | POA: Insufficient documentation

## 2022-11-07 DIAGNOSIS — L97212 Non-pressure chronic ulcer of right calf with fat layer exposed: Secondary | ICD-10-CM | POA: Insufficient documentation

## 2022-11-07 DIAGNOSIS — J069 Acute upper respiratory infection, unspecified: Secondary | ICD-10-CM | POA: Insufficient documentation

## 2022-11-07 LAB — URINALYSIS REFLEX TO MICROSCOPIC EXAM - REFLEX TO CULTURE
Bilirubin, UA: NEGATIVE
Blood, UA: NEGATIVE
Glucose, UA: NEGATIVE
Ketones UA: NEGATIVE
Nitrite, UA: NEGATIVE
Protein, UR: NEGATIVE
Specific Gravity UA: 1.021 (ref 1.001–1.035)
Urine pH: 7 (ref 5.0–8.0)
Urobilinogen, UA: NORMAL mg/dL (ref 0.2–2.0)

## 2022-11-07 LAB — COVID-19 (SARS-COV-2) & INFLUENZA  A/B, NAA (ROCHE LIAT)
Influenza A: NOT DETECTED
Influenza B: NOT DETECTED
SARS CoV 2 Overall Result: NOT DETECTED

## 2022-11-07 MED ORDER — BENZONATATE 100 MG PO CAPS
100.0000 mg | ORAL_CAPSULE | Freq: Three times a day (TID) | ORAL | 0 refills | Status: AC | PRN
Start: 2022-11-07 — End: ?

## 2022-11-07 MED ORDER — ALBUTEROL SULFATE HFA 108 (90 BASE) MCG/ACT IN AERS
2.0000 | INHALATION_SPRAY | RESPIRATORY_TRACT | 0 refills | Status: AC | PRN
Start: 2022-11-07 — End: 2024-06-24

## 2022-11-07 NOTE — ED Notes (Signed)
Bed: E07  Expected date:   Expected time:   Means of arrival:   Comments:

## 2022-11-07 NOTE — ED Provider Notes (Signed)
EMERGENCY DEPARTMENT NOTE     Patient initially seen and examined at   ED PHYSICIAN ASSIGNED       Date/Time Event User Comments    11/07/22 1343 Physician Assigned [facility], [place]. Ramiro Harvest, DO assigned as Attending           ED MIDLEVEL (APP) ASSIGNED       None            HISTORY OF PRESENT ILLNESS       Chief Complaint: Cough       57 y.o. male with past medical history as below who presents to the ED with cough, congestion, sore throat, loss of taste/smell. Cough worse at night.  Was concerned and wanted to be tested for COVID 19. Reports onset of symptoms over the past few days. Was in the hospital for wound care/dressing change so decided to come to the ED to be evaluated.  Denies any significant chest pain, shortness of breath, fevers.    Patient also reports he had an episode of blood in urine last week. Reports it only happened once and then resolved. Denies any dysuria, fever, flank pain, abdominal pain.     Independent Historian (other than patient): No  Additional History Provided by Independent Historian:  MEDICAL HISTORY     Past Medical History:  Past Medical History:   Diagnosis Date    GERD (gastroesophageal reflux disease)     Hypertension     Lower back pain        Past Surgical History:  No past surgical history on file.    Social History:  Social History     Socioeconomic History    Marital status: Married   Tobacco Use    Smoking status: Heavy Smoker     Packs/day: 1     Types: Cigarettes    Smokeless tobacco: Current   Substance and Sexual Activity    Alcohol use: Yes     Comment: occasionally     Drug use: Yes     Comment: marijuana     Social Determinants of Health     Food Insecurity: Unknown (11/07/2022)    Hunger Vital Sign     Worried About Running Out of Food in the Last Year: Never true       Family History:  No family history on file.    Outpatient Medication:  Discharge Medication List as of 11/07/2022  4:24 PM        CONTINUE these medications which have NOT CHANGED    Details    amlodipine-benazepril (LOTREL) 10-20 MG per capsule Take 1 capsule by mouth daily, Historical Med      cloNIDine (CATAPRES) 0.3 MG tablet Take 1 tablet (0.3 mg) by mouth 2 (two) times daily, Historical Med      hydrochlorothiazide (HYDRODIURIL) 25 MG tablet Take 1 tablet (25 mg) by mouth daily, Historical Med      levoFLOXacin (LEVAQUIN) 500 MG tablet Take 1 tablet (500 mg) by mouth daily for 10 days, Starting Mon 11/05/2022, Until Thu 11/15/2022, E-Rx      omeprazole (PRILOSEC) 40 MG capsule Take 1 capsule (40 mg) by mouth daily, Historical Med               REVIEW OF SYSTEMS   Review of Systems See History of Present Illness  PHYSICAL EXAM     ED Triage Vitals [11/07/22 1316]   Enc Vitals Group      BP (!) 179/112  Heart Rate 97      Resp Rate 20      Temp 98.2 F (36.8 C)      Temp Source Oral      SpO2 94 %      Weight       Height       Head Circumference       Peak Flow       Pain Score       Pain Loc       Pain Edu?       Excl. in [place]?      Physical Exam   CONSTITUTIONAL:  no acute distress, nontoxic appearing  EYES:  no scleral icterus. no conjunctival erythema  HEAD:  atraumatic, normocephalic  ENT:   no facial swelling, no nasal deformity. Airway patent. Normal posterior pharynx  NECK:  no swelling, trachea midline  CARDIOVASCULAR:  regular rate and rhythm  PULMONARY/CHEST: lungs clear, no respiratory distress, no wheezing  ABDOMEN:  soft, nontender, no distention, obese  BACK: no CVA tenderness  MUSCULOSKELETAL:  no deformity  SKIN:  warm and dry, no rash  NEURO: normal speech. no facial droop. moves all extremities.       MEDICAL DECISION MAKING     PRIMARY PROBLEM LIST      Acute illness/injury DIAGNOSIS: Viral URI  Chronic Illness Impacting Care of the above problem: Obesity Increases complexity of evaluation, Increases the risk of severe disease, Increase the risk of disease progression, and Increases the risk of poor wound healing  Differential Diagnosis includes but not limited to: URI (adult):  pharyngitis, URI, pneumonia, viral syndrome, asthma/COPD exacerbation, bronchitis, sepsis, pulmonary edema, myocarditis, pericarditis, influenza, COVID19   Differential for hematuria: kidney stone, UTI, hemorrhagic cystitis    DISCUSSION      COVID and flu negative. Suspected viral URI. UA without evidence of hematuria or other concerning findings. Discussed findings with patient. Recommended follow up with primary care. Return precautions given.              External Records Reviewed?: Physician Office Records  Wound care records from 11/05/2022 reviewed.  Additional Notes                     Vital Signs: Reviewed the patient's vital signs.   Nursing Notes: Reviewed and utilized available nursing notes.  Medical Records Reviewed: Reviewed available past medical records.  Counseling: The emergency provider has spoken with the patient and discussed today's findings, in addition to providing specific details for the plan of care.  Questions are answered and there is agreement with the plan.      MIPS DOCUMENTATION              CARDIAC STUDIES    The following cardiac studies were independently interpreted by me the Emergency Medicine Provider.  For full cardiac study results please see chart.                                                EMERGENCY IMAGING STUDIES    The following imagine studies were independently interpreted by me (emergency medicine provider):                       RADIOLOGY IMAGING STUDIES      No orders to display  EMERGENCY DEPT. MEDICATIONS      ED Medication Orders (From admission, onward)      None            LABORATORY RESULTS    Ordered and independently interpreted AVAILABLE laboratory tests.   Results       Procedure Component Value Units Date/Time    Urinalysis Reflex to Microscopic Exam- Reflex to Culture [id number]  (Abnormal) Collected: 11/07/22 1602    Specimen: Urine, Clean Catch Updated: 11/07/22 1618     Urine Type Urine, Clean Ca     Color, UA Straw     Clarity, UA Clear      Specific Gravity UA 1.021     Urine pH 7.0     Leukocyte Esterase, UA Trace     Nitrite, UA Negative     Protein, UR Negative     Glucose, UA Negative     Ketones UA Negative     Urobilinogen, UA Normal mg/dL      Bilirubin, UA Negative     Blood, UA Negative     RBC, UA 3-5 /hpf      WBC, UA 0-5 /hpf      Squamous Epithelial Cells, Urine 0-5 /hpf     COVID-19 (SARS-CoV-2) and Influenza A/B, NAA ([facility] Rapid) [id number] Collected: 11/07/22 1323    Specimen: Culturette from Nasopharyngeal Updated: 11/07/22 1416     Purpose of COVID testing Diagnostic -PUI     SARS-CoV-2 Specimen Source Nasal Swab     SARS CoV 2 Overall Result Not Detected     Influenza A Not Detected     Influenza B Not Detected    Narrative:      o Collect and clearly label specimen type:  o PREFERRED-Upper respiratory specimen: One Nasal Swab in  Transport Media.  o Hand deliver to laboratory ASAP  Diagnostic -PUI              CRITICAL CARE/PROCEDURES    Procedures    DIAGNOSIS      Diagnosis:  Final diagnoses:   Upper respiratory tract infection, unspecified type       Disposition:  ED Disposition       ED Disposition   Discharge    Condition   --    Date/Time   Wed Nov 07, 2022  4:22 PM    Comment   Gaetana Michaelis discharge to home/self care.    Condition at disposition: Stable                 Prescriptions:  Discharge Medication List as of 11/07/2022  4:24 PM        START taking these medications    Details   albuterol sulfate HFA (PROVENTIL) 108 (90 Base) MCG/ACT inhaler Inhale 2 puffs into the lungs every 4 (four) hours as needed for Wheezing or Shortness of Breath (coughing) Dispense with spacer, Starting Wed 11/07/2022, Until Fri 12/07/2022 at 2359, E-Rx           CONTINUE these medications which have CHANGED    Details   benzonatate (TESSALON) 100 MG capsule Take 1 capsule (100 mg) by mouth 3 (three) times daily as needed for Cough, Starting Wed 11/07/2022, E-Rx           CONTINUE these medications which have NOT CHANGED    Details    amlodipine-benazepril (LOTREL) 10-20 MG per capsule Take 1 capsule by mouth daily, Historical Med      cloNIDine (CATAPRES) 0.3 MG tablet Take  1 tablet (0.3 mg) by mouth 2 (two) times daily, Historical Med      hydrochlorothiazide (HYDRODIURIL) 25 MG tablet Take 1 tablet (25 mg) by mouth daily, Historical Med      levoFLOXacin (LEVAQUIN) 500 MG tablet Take 1 tablet (500 mg) by mouth daily for 10 days, Starting Mon 11/05/2022, Until Thu 11/15/2022, E-Rx      omeprazole (PRILOSEC) 40 MG capsule Take 1 capsule (40 mg) by mouth daily, Historical Med                 This note was generated by the Epic EMR system/ Dragon speech recognition and may contain inherent errors or omissions not intended by the user. Grammatical errors, random word insertions, deletions and pronoun errors  are occasional consequences of this technology due to software limitations. Not all errors are caught or corrected. If there are questions or concerns about the content of this note or information contained within the body of this dictation they should be addressed directly with the author for clarification.           Ramiro Harvest, DO  11/07/22 2123

## 2022-11-14 ENCOUNTER — Ambulatory Visit: Payer: Medicare PPO | Attending: Foot & Ankle Surgery

## 2022-11-14 DIAGNOSIS — L97222 Non-pressure chronic ulcer of left calf with fat layer exposed: Secondary | ICD-10-CM | POA: Insufficient documentation

## 2022-11-14 DIAGNOSIS — L97219 Non-pressure chronic ulcer of right calf with unspecified severity: Secondary | ICD-10-CM

## 2022-11-14 DIAGNOSIS — F17210 Nicotine dependence, cigarettes, uncomplicated: Secondary | ICD-10-CM | POA: Insufficient documentation

## 2022-11-14 DIAGNOSIS — I890 Lymphedema, not elsewhere classified: Secondary | ICD-10-CM | POA: Insufficient documentation

## 2022-11-14 DIAGNOSIS — Z6841 Body Mass Index (BMI) 40.0 and over, adult: Secondary | ICD-10-CM | POA: Insufficient documentation

## 2022-11-14 DIAGNOSIS — I10 Essential (primary) hypertension: Secondary | ICD-10-CM | POA: Insufficient documentation

## 2022-11-14 DIAGNOSIS — L97212 Non-pressure chronic ulcer of right calf with fat layer exposed: Secondary | ICD-10-CM | POA: Insufficient documentation

## 2022-11-21 ENCOUNTER — Ambulatory Visit: Payer: Medicare PPO | Attending: Foot & Ankle Surgery

## 2022-11-21 DIAGNOSIS — L97211 Non-pressure chronic ulcer of right calf limited to breakdown of skin: Secondary | ICD-10-CM | POA: Insufficient documentation

## 2022-11-21 DIAGNOSIS — I10 Essential (primary) hypertension: Secondary | ICD-10-CM | POA: Insufficient documentation

## 2022-11-21 DIAGNOSIS — F17210 Nicotine dependence, cigarettes, uncomplicated: Secondary | ICD-10-CM | POA: Insufficient documentation

## 2022-11-21 DIAGNOSIS — I890 Lymphedema, not elsewhere classified: Secondary | ICD-10-CM | POA: Insufficient documentation

## 2022-11-21 DIAGNOSIS — Z6841 Body Mass Index (BMI) 40.0 and over, adult: Secondary | ICD-10-CM | POA: Insufficient documentation

## 2022-11-21 DIAGNOSIS — L97219 Non-pressure chronic ulcer of right calf with unspecified severity: Secondary | ICD-10-CM

## 2022-11-21 DIAGNOSIS — K219 Gastro-esophageal reflux disease without esophagitis: Secondary | ICD-10-CM | POA: Insufficient documentation

## 2022-11-21 DIAGNOSIS — L97222 Non-pressure chronic ulcer of left calf with fat layer exposed: Secondary | ICD-10-CM | POA: Insufficient documentation

## 2022-11-28 ENCOUNTER — Ambulatory Visit: Payer: Medicare PPO

## 2022-12-05 ENCOUNTER — Ambulatory Visit: Payer: Medicare PPO | Attending: Foot & Ankle Surgery

## 2022-12-05 DIAGNOSIS — L97222 Non-pressure chronic ulcer of left calf with fat layer exposed: Secondary | ICD-10-CM | POA: Insufficient documentation

## 2022-12-05 DIAGNOSIS — I890 Lymphedema, not elsewhere classified: Secondary | ICD-10-CM | POA: Insufficient documentation

## 2022-12-05 DIAGNOSIS — Z6841 Body Mass Index (BMI) 40.0 and over, adult: Secondary | ICD-10-CM | POA: Insufficient documentation

## 2022-12-05 DIAGNOSIS — I10 Essential (primary) hypertension: Secondary | ICD-10-CM | POA: Insufficient documentation

## 2022-12-05 DIAGNOSIS — F17210 Nicotine dependence, cigarettes, uncomplicated: Secondary | ICD-10-CM | POA: Insufficient documentation

## 2022-12-05 DIAGNOSIS — L97212 Non-pressure chronic ulcer of right calf with fat layer exposed: Secondary | ICD-10-CM | POA: Insufficient documentation

## 2022-12-12 ENCOUNTER — Ambulatory Visit: Payer: Medicare PPO | Attending: Foot & Ankle Surgery

## 2022-12-12 DIAGNOSIS — L97219 Non-pressure chronic ulcer of right calf with unspecified severity: Secondary | ICD-10-CM

## 2022-12-12 DIAGNOSIS — Z6841 Body Mass Index (BMI) 40.0 and over, adult: Secondary | ICD-10-CM | POA: Insufficient documentation

## 2022-12-12 DIAGNOSIS — I10 Essential (primary) hypertension: Secondary | ICD-10-CM | POA: Insufficient documentation

## 2022-12-12 DIAGNOSIS — F17210 Nicotine dependence, cigarettes, uncomplicated: Secondary | ICD-10-CM | POA: Insufficient documentation

## 2022-12-12 DIAGNOSIS — I890 Lymphedema, not elsewhere classified: Secondary | ICD-10-CM | POA: Insufficient documentation

## 2022-12-12 DIAGNOSIS — L97222 Non-pressure chronic ulcer of left calf with fat layer exposed: Secondary | ICD-10-CM | POA: Insufficient documentation

## 2022-12-18 IMAGING — DX DG HIP (WITH OR WITHOUT PELVIS) 2-3V*L*
3 series · 3 of 3 positions shown · non-contrast
Comparison: None

CLINICAL DATA: LEFT medial hip pain for 5 days, no known injury

EXAM:
DG HIP (WITH OR WITHOUT PELVIS) 2-3V LEFT

[hip ap]
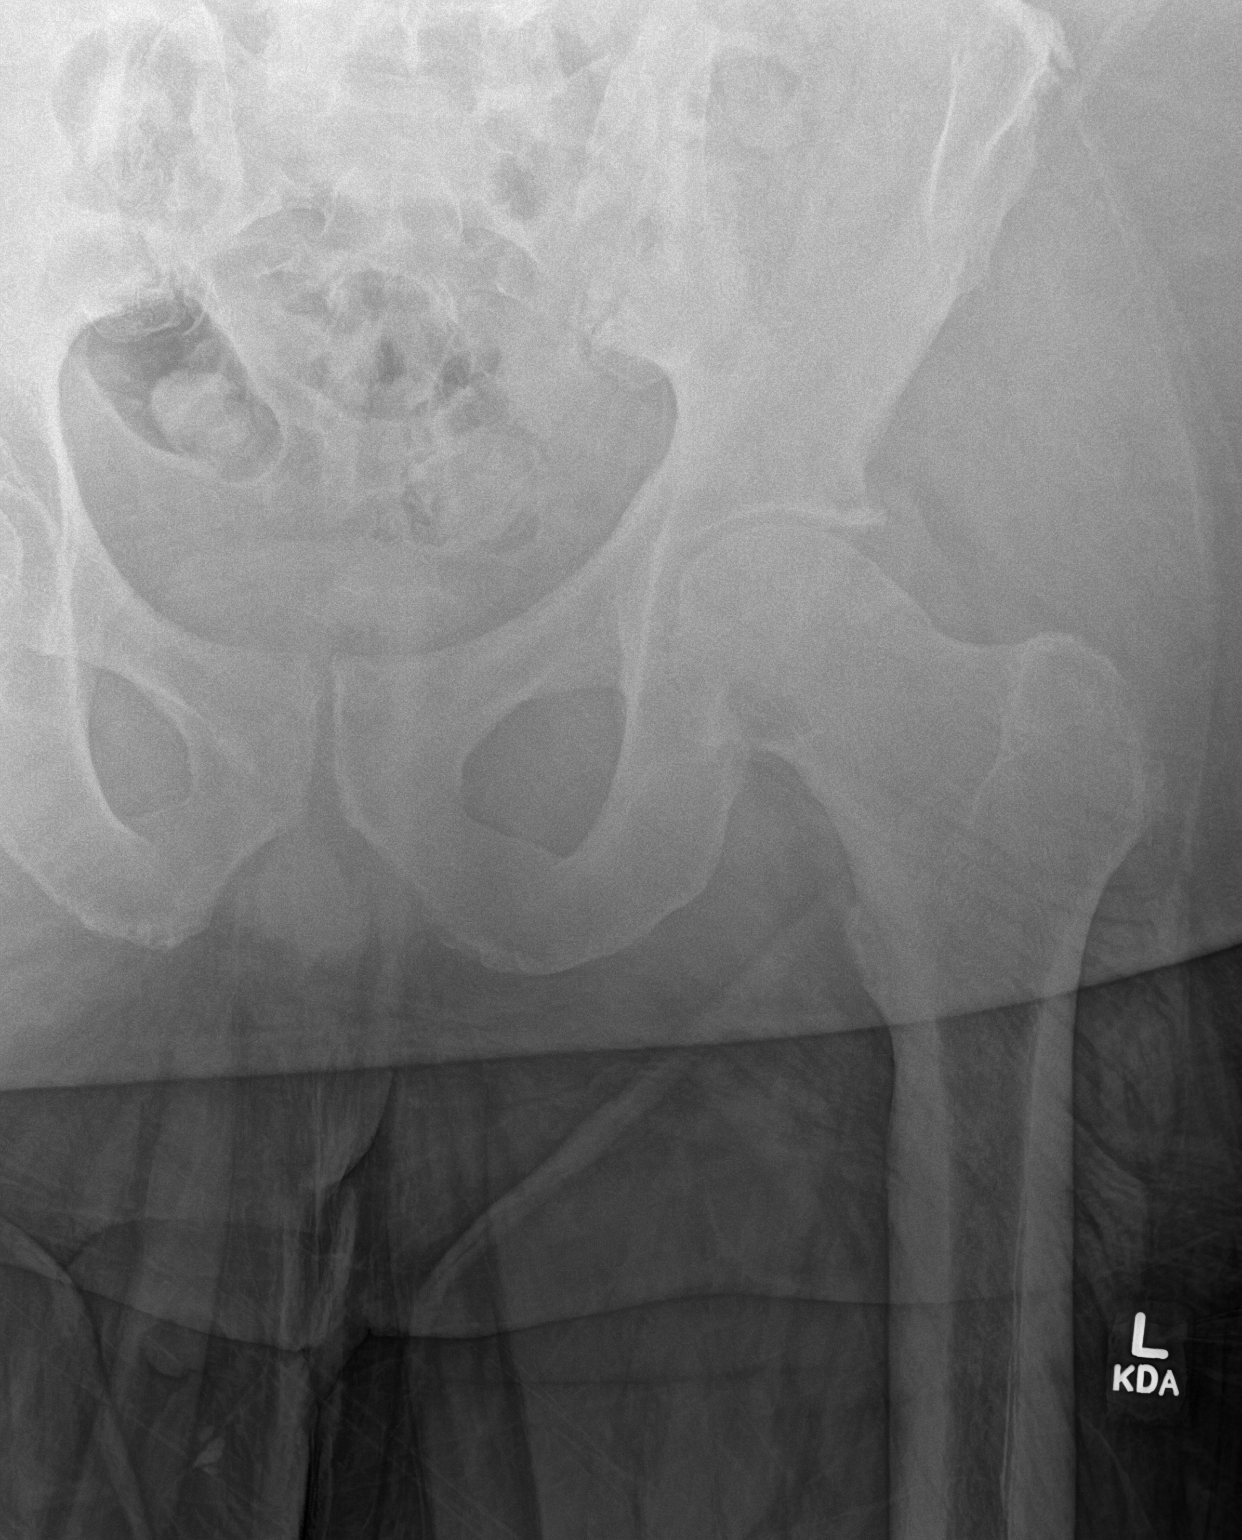

[hip lat]
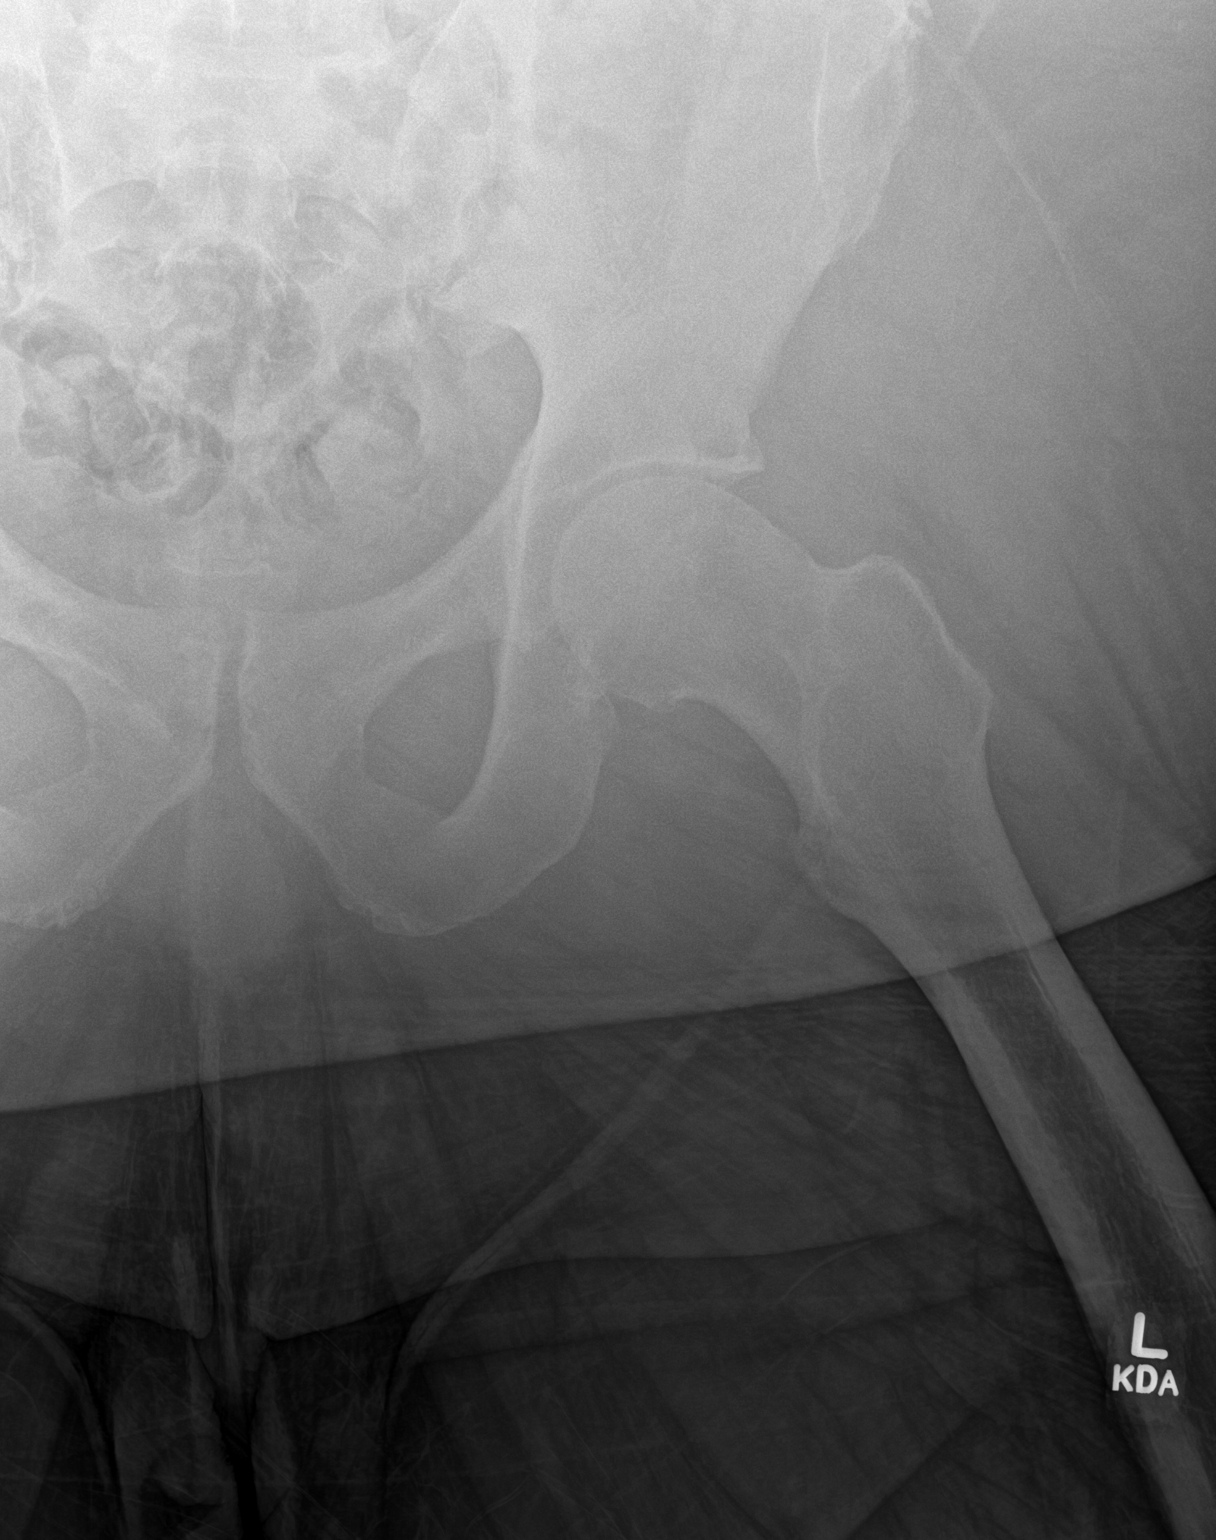

[pelvis ap]
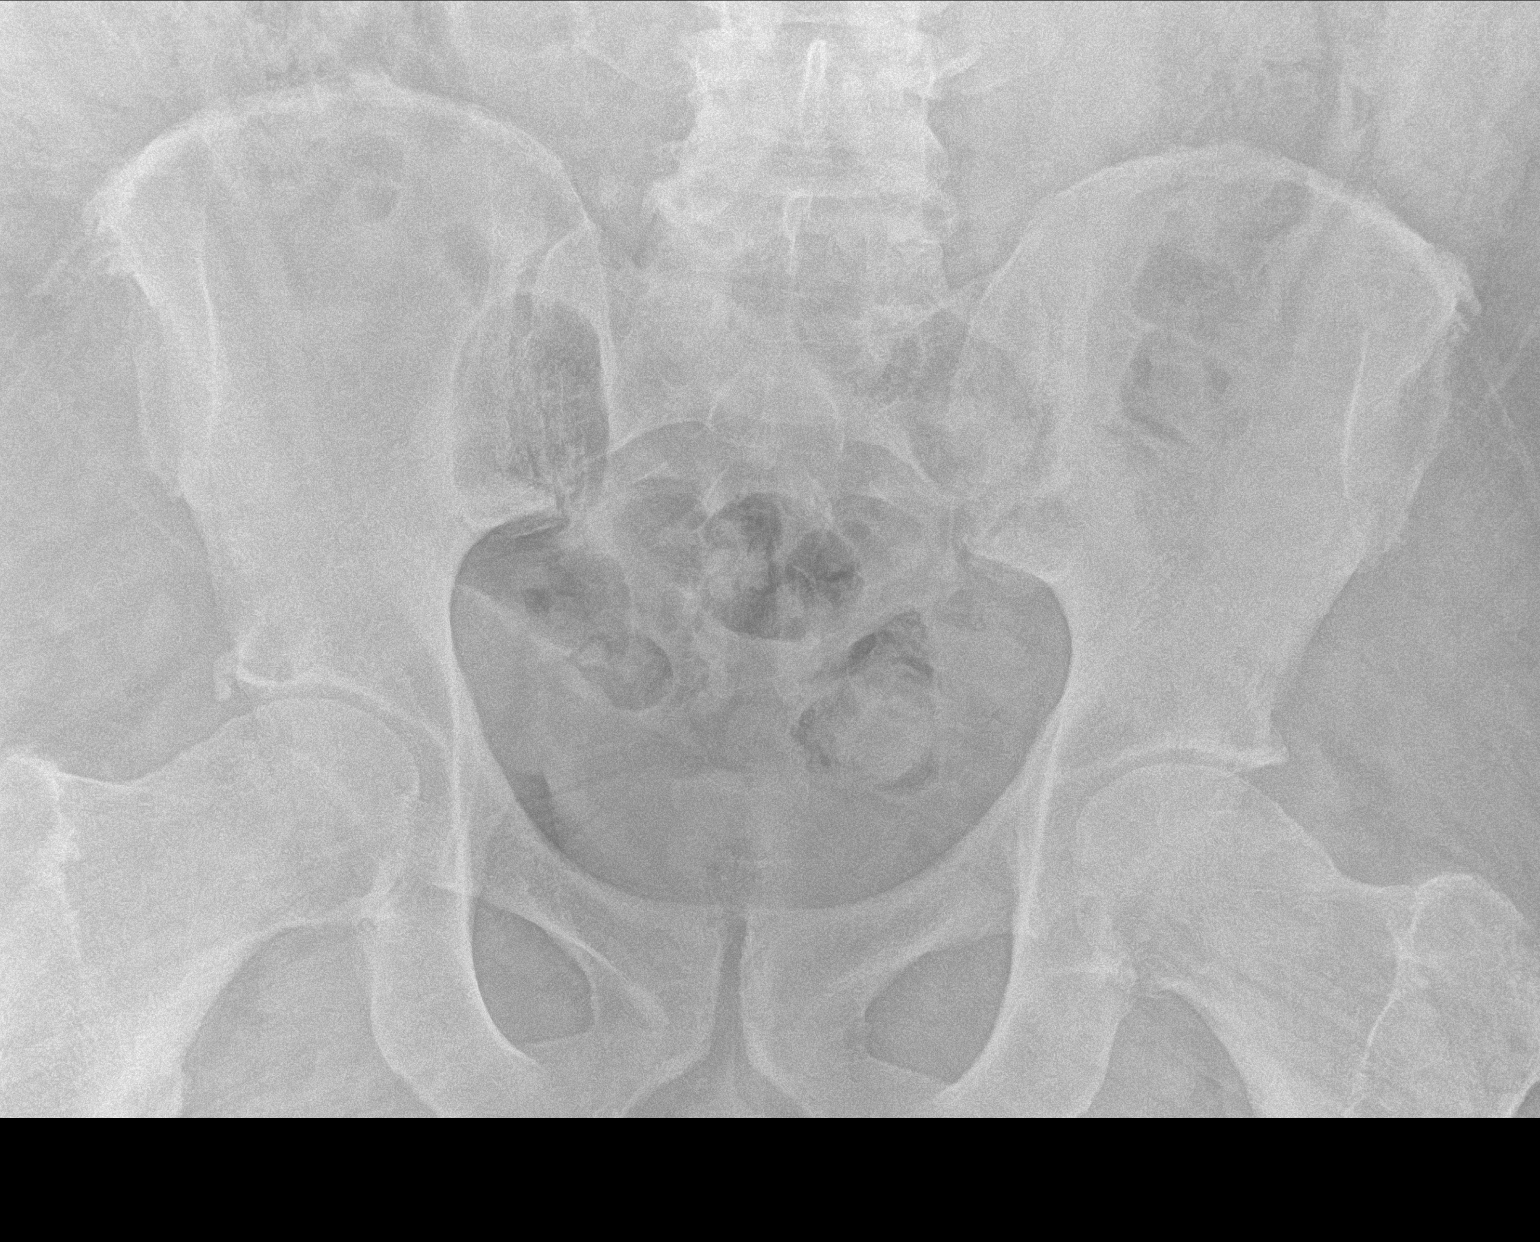

[3 of 3 positions shown; findings below may reference images not displayed]

FINDINGS: Osseous mineralization normal.

Mild narrowing of hip joints bilaterally.

SI joints preserved.

No acute fracture, dislocation, or bone destruction.
IMPRESSION: Mild degenerative changes of the hip joints.

No acute osseous abnormalities.

## 2022-12-19 ENCOUNTER — Ambulatory Visit: Payer: Medicare PPO | Attending: Foot & Ankle Surgery

## 2022-12-19 DIAGNOSIS — L97219 Non-pressure chronic ulcer of right calf with unspecified severity: Secondary | ICD-10-CM

## 2022-12-19 DIAGNOSIS — F17210 Nicotine dependence, cigarettes, uncomplicated: Secondary | ICD-10-CM | POA: Insufficient documentation

## 2022-12-19 DIAGNOSIS — I890 Lymphedema, not elsewhere classified: Secondary | ICD-10-CM | POA: Insufficient documentation

## 2022-12-19 DIAGNOSIS — I10 Essential (primary) hypertension: Secondary | ICD-10-CM | POA: Insufficient documentation

## 2022-12-19 DIAGNOSIS — L97222 Non-pressure chronic ulcer of left calf with fat layer exposed: Secondary | ICD-10-CM | POA: Insufficient documentation

## 2022-12-19 DIAGNOSIS — Z6841 Body Mass Index (BMI) 40.0 and over, adult: Secondary | ICD-10-CM | POA: Insufficient documentation

## 2022-12-26 ENCOUNTER — Ambulatory Visit: Payer: Medicare PPO

## 2023-01-02 ENCOUNTER — Ambulatory Visit: Payer: Medicare PPO | Attending: Foot & Ankle Surgery

## 2023-01-02 DIAGNOSIS — I10 Essential (primary) hypertension: Secondary | ICD-10-CM | POA: Insufficient documentation

## 2023-01-02 DIAGNOSIS — Z6841 Body Mass Index (BMI) 40.0 and over, adult: Secondary | ICD-10-CM | POA: Insufficient documentation

## 2023-01-02 DIAGNOSIS — F17210 Nicotine dependence, cigarettes, uncomplicated: Secondary | ICD-10-CM | POA: Insufficient documentation

## 2023-01-02 DIAGNOSIS — I890 Lymphedema, not elsewhere classified: Secondary | ICD-10-CM | POA: Insufficient documentation

## 2023-01-02 DIAGNOSIS — L97219 Non-pressure chronic ulcer of right calf with unspecified severity: Secondary | ICD-10-CM

## 2023-01-02 DIAGNOSIS — L97222 Non-pressure chronic ulcer of left calf with fat layer exposed: Secondary | ICD-10-CM | POA: Insufficient documentation

## 2023-01-09 ENCOUNTER — Ambulatory Visit: Payer: Medicare PPO | Attending: Foot & Ankle Surgery

## 2023-01-09 DIAGNOSIS — L97219 Non-pressure chronic ulcer of right calf with unspecified severity: Secondary | ICD-10-CM

## 2023-01-09 DIAGNOSIS — L97222 Non-pressure chronic ulcer of left calf with fat layer exposed: Secondary | ICD-10-CM | POA: Insufficient documentation

## 2023-01-09 DIAGNOSIS — I890 Lymphedema, not elsewhere classified: Secondary | ICD-10-CM | POA: Insufficient documentation

## 2023-01-09 DIAGNOSIS — Z6841 Body Mass Index (BMI) 40.0 and over, adult: Secondary | ICD-10-CM | POA: Insufficient documentation

## 2023-01-09 DIAGNOSIS — F17210 Nicotine dependence, cigarettes, uncomplicated: Secondary | ICD-10-CM | POA: Insufficient documentation

## 2023-01-09 DIAGNOSIS — I10 Essential (primary) hypertension: Secondary | ICD-10-CM | POA: Insufficient documentation

## 2023-01-16 ENCOUNTER — Ambulatory Visit: Payer: Medicare PPO

## 2023-01-23 ENCOUNTER — Ambulatory Visit: Payer: Medicare PPO | Attending: Foot & Ankle Surgery

## 2023-01-23 DIAGNOSIS — I10 Essential (primary) hypertension: Secondary | ICD-10-CM | POA: Insufficient documentation

## 2023-01-23 DIAGNOSIS — F17210 Nicotine dependence, cigarettes, uncomplicated: Secondary | ICD-10-CM | POA: Insufficient documentation

## 2023-01-23 DIAGNOSIS — I890 Lymphedema, not elsewhere classified: Secondary | ICD-10-CM | POA: Insufficient documentation

## 2023-01-23 DIAGNOSIS — Z6841 Body Mass Index (BMI) 40.0 and over, adult: Secondary | ICD-10-CM | POA: Insufficient documentation

## 2023-01-23 DIAGNOSIS — L97222 Non-pressure chronic ulcer of left calf with fat layer exposed: Secondary | ICD-10-CM | POA: Insufficient documentation

## 2023-01-23 DIAGNOSIS — L97219 Non-pressure chronic ulcer of right calf with unspecified severity: Secondary | ICD-10-CM

## 2023-02-06 ENCOUNTER — Ambulatory Visit: Payer: Medicare PPO | Attending: Foot & Ankle Surgery

## 2023-02-06 DIAGNOSIS — L97222 Non-pressure chronic ulcer of left calf with fat layer exposed: Secondary | ICD-10-CM | POA: Insufficient documentation

## 2023-02-06 DIAGNOSIS — F17210 Nicotine dependence, cigarettes, uncomplicated: Secondary | ICD-10-CM | POA: Insufficient documentation

## 2023-02-06 DIAGNOSIS — I10 Essential (primary) hypertension: Secondary | ICD-10-CM | POA: Insufficient documentation

## 2023-02-06 DIAGNOSIS — I890 Lymphedema, not elsewhere classified: Secondary | ICD-10-CM | POA: Insufficient documentation

## 2023-02-06 DIAGNOSIS — L97219 Non-pressure chronic ulcer of right calf with unspecified severity: Secondary | ICD-10-CM

## 2023-02-06 DIAGNOSIS — R600 Localized edema: Secondary | ICD-10-CM | POA: Insufficient documentation

## 2023-02-06 DIAGNOSIS — Z6841 Body Mass Index (BMI) 40.0 and over, adult: Secondary | ICD-10-CM | POA: Insufficient documentation

## 2023-02-20 ENCOUNTER — Ambulatory Visit: Payer: Medicare PPO | Attending: Foot & Ankle Surgery

## 2023-02-20 DIAGNOSIS — L97222 Non-pressure chronic ulcer of left calf with fat layer exposed: Secondary | ICD-10-CM | POA: Insufficient documentation

## 2023-02-20 DIAGNOSIS — S81802A Unspecified open wound, left lower leg, initial encounter: Secondary | ICD-10-CM | POA: Insufficient documentation

## 2023-02-20 DIAGNOSIS — I10 Essential (primary) hypertension: Secondary | ICD-10-CM | POA: Insufficient documentation

## 2023-02-20 DIAGNOSIS — R600 Localized edema: Secondary | ICD-10-CM | POA: Insufficient documentation

## 2023-02-20 DIAGNOSIS — L97219 Non-pressure chronic ulcer of right calf with unspecified severity: Secondary | ICD-10-CM

## 2023-02-20 DIAGNOSIS — F17210 Nicotine dependence, cigarettes, uncomplicated: Secondary | ICD-10-CM | POA: Insufficient documentation

## 2023-02-20 DIAGNOSIS — S81801A Unspecified open wound, right lower leg, initial encounter: Secondary | ICD-10-CM | POA: Insufficient documentation

## 2023-02-20 DIAGNOSIS — Z6841 Body Mass Index (BMI) 40.0 and over, adult: Secondary | ICD-10-CM | POA: Insufficient documentation

## 2023-02-27 ENCOUNTER — Ambulatory Visit: Payer: Medicare PPO | Attending: Foot & Ankle Surgery

## 2023-02-27 DIAGNOSIS — Z6841 Body Mass Index (BMI) 40.0 and over, adult: Secondary | ICD-10-CM | POA: Insufficient documentation

## 2023-02-27 DIAGNOSIS — I10 Essential (primary) hypertension: Secondary | ICD-10-CM | POA: Insufficient documentation

## 2023-02-27 DIAGNOSIS — L97219 Non-pressure chronic ulcer of right calf with unspecified severity: Secondary | ICD-10-CM

## 2023-02-27 DIAGNOSIS — L97222 Non-pressure chronic ulcer of left calf with fat layer exposed: Secondary | ICD-10-CM | POA: Insufficient documentation

## 2023-02-27 DIAGNOSIS — F17210 Nicotine dependence, cigarettes, uncomplicated: Secondary | ICD-10-CM | POA: Insufficient documentation

## 2023-03-13 ENCOUNTER — Ambulatory Visit: Payer: Medicare PPO | Attending: Foot & Ankle Surgery

## 2023-03-13 DIAGNOSIS — F17210 Nicotine dependence, cigarettes, uncomplicated: Secondary | ICD-10-CM | POA: Insufficient documentation

## 2023-03-13 DIAGNOSIS — I890 Lymphedema, not elsewhere classified: Secondary | ICD-10-CM | POA: Insufficient documentation

## 2023-03-13 DIAGNOSIS — I87331 Chronic venous hypertension (idiopathic) with ulcer and inflammation of right lower extremity: Secondary | ICD-10-CM | POA: Insufficient documentation

## 2023-03-13 DIAGNOSIS — I10 Essential (primary) hypertension: Secondary | ICD-10-CM | POA: Insufficient documentation

## 2023-03-13 DIAGNOSIS — L97811 Non-pressure chronic ulcer of other part of right lower leg limited to breakdown of skin: Secondary | ICD-10-CM | POA: Insufficient documentation

## 2023-03-13 DIAGNOSIS — L97219 Non-pressure chronic ulcer of right calf with unspecified severity: Secondary | ICD-10-CM

## 2023-03-13 DIAGNOSIS — L97222 Non-pressure chronic ulcer of left calf with fat layer exposed: Secondary | ICD-10-CM | POA: Insufficient documentation

## 2023-03-27 ENCOUNTER — Ambulatory Visit: Payer: Medicare PPO | Attending: Foot & Ankle Surgery

## 2023-03-27 DIAGNOSIS — I87331 Chronic venous hypertension (idiopathic) with ulcer and inflammation of right lower extremity: Secondary | ICD-10-CM | POA: Insufficient documentation

## 2023-03-27 DIAGNOSIS — I890 Lymphedema, not elsewhere classified: Secondary | ICD-10-CM | POA: Insufficient documentation

## 2023-03-27 DIAGNOSIS — F17210 Nicotine dependence, cigarettes, uncomplicated: Secondary | ICD-10-CM | POA: Insufficient documentation

## 2023-03-27 DIAGNOSIS — L97219 Non-pressure chronic ulcer of right calf with unspecified severity: Secondary | ICD-10-CM

## 2023-03-27 DIAGNOSIS — I10 Essential (primary) hypertension: Secondary | ICD-10-CM | POA: Insufficient documentation

## 2023-03-27 DIAGNOSIS — L97222 Non-pressure chronic ulcer of left calf with fat layer exposed: Secondary | ICD-10-CM | POA: Insufficient documentation

## 2023-03-27 DIAGNOSIS — L97812 Non-pressure chronic ulcer of other part of right lower leg with fat layer exposed: Secondary | ICD-10-CM | POA: Insufficient documentation

## 2023-04-10 ENCOUNTER — Ambulatory Visit: Payer: Medicare PPO | Attending: Surgery

## 2023-04-10 DIAGNOSIS — L97812 Non-pressure chronic ulcer of other part of right lower leg with fat layer exposed: Secondary | ICD-10-CM | POA: Insufficient documentation

## 2023-04-10 DIAGNOSIS — Z6841 Body Mass Index (BMI) 40.0 and over, adult: Secondary | ICD-10-CM | POA: Insufficient documentation

## 2023-04-10 DIAGNOSIS — I87331 Chronic venous hypertension (idiopathic) with ulcer and inflammation of right lower extremity: Secondary | ICD-10-CM | POA: Insufficient documentation

## 2023-04-10 DIAGNOSIS — I10 Essential (primary) hypertension: Secondary | ICD-10-CM | POA: Insufficient documentation

## 2023-04-10 DIAGNOSIS — L97222 Non-pressure chronic ulcer of left calf with fat layer exposed: Secondary | ICD-10-CM | POA: Insufficient documentation

## 2023-04-10 DIAGNOSIS — I890 Lymphedema, not elsewhere classified: Secondary | ICD-10-CM | POA: Insufficient documentation

## 2023-04-10 DIAGNOSIS — L97219 Non-pressure chronic ulcer of right calf with unspecified severity: Secondary | ICD-10-CM

## 2023-04-10 DIAGNOSIS — X58XXXA Exposure to other specified factors, initial encounter: Secondary | ICD-10-CM | POA: Insufficient documentation

## 2023-04-10 DIAGNOSIS — F17210 Nicotine dependence, cigarettes, uncomplicated: Secondary | ICD-10-CM | POA: Insufficient documentation

## 2023-04-10 DIAGNOSIS — S81802A Unspecified open wound, left lower leg, initial encounter: Secondary | ICD-10-CM | POA: Insufficient documentation

## 2023-04-24 ENCOUNTER — Ambulatory Visit: Payer: Medicare PPO | Attending: Vascular Surgery

## 2023-04-24 DIAGNOSIS — L97222 Non-pressure chronic ulcer of left calf with fat layer exposed: Secondary | ICD-10-CM | POA: Insufficient documentation

## 2023-04-24 DIAGNOSIS — I890 Lymphedema, not elsewhere classified: Secondary | ICD-10-CM | POA: Insufficient documentation

## 2023-04-24 DIAGNOSIS — L97219 Non-pressure chronic ulcer of right calf with unspecified severity: Secondary | ICD-10-CM

## 2023-04-24 DIAGNOSIS — I87331 Chronic venous hypertension (idiopathic) with ulcer and inflammation of right lower extremity: Secondary | ICD-10-CM | POA: Insufficient documentation

## 2023-04-24 DIAGNOSIS — F17210 Nicotine dependence, cigarettes, uncomplicated: Secondary | ICD-10-CM | POA: Insufficient documentation

## 2023-04-24 DIAGNOSIS — S81802A Unspecified open wound, left lower leg, initial encounter: Secondary | ICD-10-CM | POA: Insufficient documentation

## 2023-04-24 DIAGNOSIS — Z6841 Body Mass Index (BMI) 40.0 and over, adult: Secondary | ICD-10-CM | POA: Insufficient documentation

## 2023-04-24 DIAGNOSIS — I10 Essential (primary) hypertension: Secondary | ICD-10-CM | POA: Insufficient documentation

## 2023-04-24 DIAGNOSIS — X58XXXA Exposure to other specified factors, initial encounter: Secondary | ICD-10-CM | POA: Insufficient documentation

## 2023-04-24 DIAGNOSIS — R600 Localized edema: Secondary | ICD-10-CM

## 2023-04-24 DIAGNOSIS — L97811 Non-pressure chronic ulcer of other part of right lower leg limited to breakdown of skin: Secondary | ICD-10-CM | POA: Insufficient documentation

## 2023-05-08 ENCOUNTER — Ambulatory Visit: Payer: Medicare PPO | Attending: Foot & Ankle Surgery | Admitting: Foot & Ankle Surgery

## 2023-05-08 VITALS — BP 157/83 | HR 72 | Temp 98.1°F

## 2023-05-08 DIAGNOSIS — L97219 Non-pressure chronic ulcer of right calf with unspecified severity: Secondary | ICD-10-CM

## 2023-05-08 DIAGNOSIS — L97222 Non-pressure chronic ulcer of left calf with fat layer exposed: Secondary | ICD-10-CM | POA: Insufficient documentation

## 2023-05-08 DIAGNOSIS — I10 Essential (primary) hypertension: Secondary | ICD-10-CM | POA: Insufficient documentation

## 2023-05-08 DIAGNOSIS — I890 Lymphedema, not elsewhere classified: Secondary | ICD-10-CM | POA: Insufficient documentation

## 2023-05-08 MED ORDER — AMOXICILLIN-POT CLAVULANATE 875-125 MG PO TABS
1.0000 | ORAL_TABLET | Freq: Two times a day (BID) | ORAL | 0 refills | Status: AC
Start: 2023-05-08 — End: 2023-05-18

## 2023-05-08 NOTE — Progress Notes (Signed)
[facility]  [facility]:  [address], [place], [place] [address]  T [phone]  F [phone]  Wound Follow-up - Nursing Documentation    PATIENT: [name] DOB: 27-Jun-1966   MR #: [id number]  AGE: 57 y.o.    REFERRING MD:  Glenford Peers, DPM PRIMARY MD: [name], MD      Chief Complaint   Patient presents with    Wound Check        Assessment & Plan     Case Management:  Name and DOB confirmed. pt states wraps stayed up this week, pt unable to increase frequency of visits d/t financial concerns (~$150-200 per visit). Dr. Murrell Converse suggested [facility] or [facility].  Treatment:  C&S taken of L posterior lateral wound  NUU:VOZDGUY soak 16min,gent, aquacel AG   RLE-antifungal, hydragaurd, 3 layer compression  LLE- antifungal, hydrahayrd, 3 layer compression  Procedures:  Additional Wound Procedures    Performed by: Diamantina Monks, RN  Authorized by: Glenford Peers, DPM    Associated wounds:   Wound 10/31/20 Edema/Edema Management Lower Leg Anterior;Left  Wound 10/31/20 Edema/Edema Management Lower Leg Anterior;Right  Body area:  Lower extremity    Compression wrap type: multi-layer    Wrap location: left    Wrap location equal debrided location?: yes    Debrided location: left    Multi-layer: 3 layer     Procedure was performed in a clean field. The leg was washed with soap and water and then patted dry. Moisturizer was applied to the leg. A multi-layer compression bandage was applied per manufacturer's instructions. Patient tolerated procedure well and without complaints of pain or discomfort. Patient was educated regarding signs and symptoms of overcompression, including unrelieved pain, toes turning red or purple, or numbness in foot.  Patient was instructed to manually remove outer layer (without scissors) and contact clinic immediately.       Patient Education:  RTC 2 weeks    Reviewed leg elevation and stressed importance when/if wraps fall, pt states it is hard  to do with his hip arthritis.    Patient and Caregiver education was given regarding procedures/therapy planned with verbalized understanding.    Diamantina Monks, RN   05/08/2023

## 2023-05-08 NOTE — Progress Notes (Signed)
Ambulatory [facility]    Wound Follow-Up - Provider Documentation    PATIENT: [name] DOB: [birth year]   MR #: [id number]  AGE: 57 y.o.    REFERRING MD:  Glenford Peers, DPM PRIMARY MD: [name], MD        No chief complaint on file.     The patient was seen in the office today and a follow up evaluation was performed, including a history & focused exam.     [name] is a 57 year old black male who presents today with Patient reports he has had recurrent wounds and swelling of both lower legs for the past two years.  He was being treated at a wound center in [place] but moved to [place] in July 2023.  He has been self treating with gauze/coban dressings and took a prescription for penicillin in January 2024.  PMH significant for morbid obesity, current tobacco user, hypertention, GERD and chronic back pain. Negative for diabetes.   The patient states his chief complaint is "wounds of bilateral lower legs."    History of Present Illness   [name] is a 57 y.o. male who presents with bilateral lower extremity edema and left leg ulcers.  Patient is complaining of itching for last couple days on his left leg.  Patient denies fever, chills, nausea, vomiting, and leg pain.  Patient has been getting Dakin's 10 minutes soaking, endoform, restore AG with zinc impregnated multilayer Coban 2 compression therapy.    Past Medical History     Past Medical History:   Diagnosis Date    GERD (gastroesophageal reflux disease)     Hypertension     Lower back pain        Past Surgical History   No past surgical history on file.    Family History   No family history on file.    Social History   Social History[1]    Allergies   Allergies[2]    Medications   Current Medications[3]    Physical Exam     Vitals:    05/08/23 1110   BP: 157/83   Pulse: 72   Temp: 98.1 F (36.7 C)       There is no height or weight on file to calculate BMI.    General:  Patient appears their stated  age, well-nourished.  Alert and in no apparent distress.  Lungs: Respiratory effort unlabored, no signs of distress  Vascular: normal capillary refill  Skin: see focused wound care exam  Neuro: AAOx3 normal speech and appropriate affect, see wound care exam for sensory exam    Focused Wound Care Exam   Wound Assessment:   Wound 10/31/22 Lower Leg;Calf Left;Lateral (Active)   Date First Assessed: 10/31/22   Location: Lower Leg;Calf  Wound Location Orientation: Left;Lateral  Present on Original Admission: Yes      Assessments 05/08/2023 11:32 AM   Wound Image     Wound Length (cm) 3.2 cm   Wound Width (cm) 2.5 cm   Wound Depth (cm) 0.2 cm   Wound Surface Area (cm^2) 8 cm^2   Wound Volume (cm^3) 1.6 cm^3   Closure Open   Drainage Amount Large   Drainage Description Serosanguinous   Tunneling No   Undermining  No   Margins Open       No associated orders.       Wound 03/13/23 Venous Ulcer Lower Leg;Calf Right;Lateral (Active)   Date First Assessed: 03/13/23  Primary Wound Type: Venous Ulcer  Location: Lower Leg;Calf  Wound Location Orientation: Right;Lateral  Present on Original Admission: Yes      Assessments 05/08/2023 11:30 AM   Wound Image         No associated orders.       Wound 04/10/23 Lower Leg;Calf Left;Posterior;Lateral (Active)   Date First Assessed: 04/10/23   Location: Lower Leg;Calf  Wound Location Orientation: Left;Posterior;Lateral  Wound Description: scatter  Present on Original Admission: Yes      Assessments 05/08/2023 11:31 AM   Wound Image         No associated orders.       Wound 04/10/23 Lower Leg;Calf Left;Posterior;Medial (Active)   Date First Assessed: 04/10/23   Location: Lower Leg;Calf  Wound Location Orientation: Left;Posterior;Medial  Wound Description: scatter  Present on Original Admission: Yes      Assessments 05/08/2023 11:31 AM   Wound Image         No associated orders.       Wound 10/31/20 Edema/Edema Management Lower Leg Anterior;Left (Active)   Date First Assessed/Time First  Assessed: 10/31/20 0800   Primary Wound Type: Edema/Edema Management  Edema extremity: LLE  Edema Swelling: Pitting  Location: Lower Leg  Wound Location Orientation: Anterior;Left      Assessments 05/08/2023 11:30 AM   Wound Image     Treatments Cleansed;Compression Wrap 2   Compression Wrap 2 Coflex TLC x/Zinc       No associated orders.       Wound 10/31/20 Edema/Edema Management Lower Leg Anterior;Right (Active)   Date First Assessed/Time First Assessed: 10/31/20 0800   Primary Wound Type: Edema/Edema Management  Edema extremity: RLE  Edema Swelling: Pitting  Location: Lower Leg  Wound Location Orientation: Anterior;Right      Assessments 05/08/2023 11:29 AM   Wound Image     Treatments Cleansed;Compression Wrap 2   Compression Wrap 2 Coflex TLC x/Zinc       No associated orders.       Edema  RLE Edema: Pitting  R Foot Measurements(cm): 27 cm  R Ankle Measurements(cm): 28.5 cm  R Calf Measurements(cm): 49.5 cm  RLE Treatment: RLE Multilayer  RLE Multilayer Tx: Coban 2  LLE Edema: Pitting  L Foot Measurements(cm): 26.5 cm  L Ankle Measurements(cm): 28 cm  L Calf Measurements(cm): 51.5 cm  LLE Treatment: LLE Multilayer  LLE Multilayer Tx: Coban 2                            Labs and Imaging Reviewed   I have reviewed pertinent labs, imaging and cultures.    Procedure Note   I decided to perform debridement today because, based on the patient's current status, there is an expectation that serial debridement will improve healing potential, reduce or control tissue infection, and prepare the tissue for surgical management. The wound bed is comprised of <25% beefy red and hypergranulation granulation tissue and 100% slough/fibrin and necrotic tissue nonviable tissue. Surgical debridement is required to excise a specific, targeted area of devitalized or necrotic tissue along the margin of viable tissue by sharp dissection. I have reviewed the individualized treatment plan for this patient who requires a debridement.  Debridement is needed as part of the plan to control heavy wound colonization. I previously assessed the patient's vascular status which is adequate. I have ensured that this ulcer has been adequately off-loaded. I am continuing to address nutritional issues, monitoring weights, encouraging the  use of supplements and drawing laboratory tests when appropriate.     DEBRIDEMENT Lower Leg;Calf Left;Lateral    Performed by: Glenford Peers, DPM  Authorized by: Glenford Peers, DPM    Associated wounds:   Wound 10/31/22 Lower Leg;Calf Left;Lateral    Consent:     Consent obtained:  Verbal and written    Consent given by:  Patient    Risks discussed: Yes      Time out: Immediately prior to the procedure a time out was called    Time out performed at:  05/08/2023 12:03 PM    Debridement Details:     Performed by:  Physician    Type: excisional      Level: subcutaneous tissue      Pain control:  Lidocaine 2%    Pain control administration: topical        Time taken:  05/08/2023 11:32 AM  Pre-debridement measurements    Length (cm):  3.2    Width (cm):  2.5    Depth (cm):  0.2    Surface Area (cm^2):  8    Time taken:  05/08/2023 11:33 AM    Post-debridement measurements    Length (cm):  3.2    Width (cm):  2.5    Depth (cm):  0.4    Percent Debrided (%):  100    Surface Area (cm^2):  8    Area Debrided (cm^2):  8    Volume (cm^3):  3.2    Devitalized tissue debrided: biofilm, fibrin and necrotic debris      Instrument:  Curette and scalpel #10    Amount of bleeding: small    Specimen sent for culture: yes  Specimen sent for culture comment:  Yes  Specimen sent for pathology:   Specimen sent for pathology comment:  No  Culture obtained:  yes  Culture obtained comment:  Culture obtained.    Hemostasis obtained with:  Pressure    Procedural pain:  0    Post-procedural pain:  0    Response to treatment:  Procedure was tolerated well       Assessment   This is a 57 y.o. male presents with left leg ulcer and chronic leg edema,  lymphedema.    I examined the patient for an ulcer that has been resistant to healing despite numerous interventions. The plan is to now examine any underlying characteristics that may impede healing. I decided that the wounds are not self limited and significantly impact the patient's daily living.    The wounds are worsening  Patient has increased drainage but wound itself looks stable.  Wound culture was obtained and sent to the lab.  Rx Augmentin 875 mg twice daily was sent empirically.    The patient's vascular status was assessed and it was determined that their vascular status is adequate for healing.     I reviewed the leg, ankle and foot circumference measurements to assess the patient's response to compression.  The patient has edema to Bilateral lower extremities. The edema is stable.     Non-healing wounds are a symptom of their significant comorbid diseases. edema and lymphedema    Plan   After evaluation of the patient and ulcer/wound characteristics, I decided that debridement was was indicated today.     Wound Care Plan:  Advanced Wound dressings appropriate to the needs of the wound have been initiated to provide a microenvironment which can enhance healing including maintaining a moist environment, decreasing bioburden and insulating and  protecting the wound.   The treatment plan will change Dakin's 10 minutes soaking, gentamicin, Aquacel Ag applied as stated in nursing notes.     Edema Management:    Edema control is imperative for healing.  Patient instructed to elevate the lower extremities above the level of the heart when possible.  Active compression will be used to gain control of the patient's edema. Edema managed through use of 3-layer wrap. I described the possible complications compression wraps and told the patient to remove them if they felt too tight or became soiled, but otherwise they will leave the wraps in place until the next visit..     Nutrition:    Nutrition status is  adequate. I made suggestions regarding appropriate protein intake, vitamin supplementation, and adequate hydration. Recommendations include healthy diet    Coordination of Care: I recommended none needed at this time    Patient Education:  Dressing management. S/S infection and to call clinic/ED precautions if any were to occur.   Patient verbalized understanding and agreed with POC.   Home health was not indicated at this time..    The patient will continue to be seen on a regular basis.  Follow up at clinic  in 1 week.    Glenford Peers, DPM  05/08/2023         [1]   Social History  Socioeconomic History    Marital status: Married   Tobacco Use    Smoking status: Heavy Smoker     Current packs/day: 1.00     Types: Cigarettes    Smokeless tobacco: Current   Substance and Sexual Activity    Alcohol use: Yes     Comment: occasionally     Drug use: Yes     Comment: marijuana     Social Determinants of Health     Food Insecurity: Unknown (11/07/2022)    Hunger Vital Sign     Worried About Programme researcher, broadcasting/film/video in the Last Year: Never true    Received from [employer]    Social Network    Received from [facility]    HITS   [2] No Known Allergies  [3]   Current Outpatient Medications:     albuterol sulfate HFA (PROVENTIL) 108 (90 Base) MCG/ACT inhaler, Inhale 2 puffs into the lungs every 4 (four) hours as needed for Wheezing or Shortness of Breath (coughing) Dispense with spacer, Disp: 6.7 g, Rfl: 0    amlodipine-benazepril (LOTREL) 10-20 MG per capsule, Take 1 capsule by mouth daily, Disp: , Rfl:     amoxicillin-clavulanate (AUGMENTIN) 875-125 MG per tablet, Take 1 tablet by mouth 2 (two) times daily for 10 days, Disp: 20 tablet, Rfl: 0    benzonatate (TESSALON) 100 MG capsule, Take 1 capsule (100 mg) by mouth 3 (three) times daily as needed for Cough, Disp: 15 capsule, Rfl: 0    cloNIDine (CATAPRES) 0.3 MG tablet, Take 1 tablet (0.3 mg) by mouth 2 (two) times daily, Disp: , Rfl:     hydrochlorothiazide (HYDRODIURIL) 25  MG tablet, Take 1 tablet (25 mg) by mouth daily, Disp: , Rfl:     omeprazole (PRILOSEC) 40 MG capsule, Take 1 capsule (40 mg) by mouth daily, Disp: , Rfl:

## 2023-05-11 LAB — CULTURE AND GRAM STAIN, AEROBIC BACTERIA, WOUND/TISSUE/FLUID: Gram Stain: NONE SEEN — AB

## 2023-05-17 ENCOUNTER — Other Ambulatory Visit: Payer: Self-pay | Admitting: Medical

## 2023-05-17 ENCOUNTER — Telehealth: Payer: Self-pay | Admitting: Medical

## 2023-05-17 MED ORDER — SULFAMETHOXAZOLE-TRIMETHOPRIM 800-160 MG PO TABS
1.0000 | ORAL_TABLET | Freq: Two times a day (BID) | ORAL | 0 refills | Status: AC
Start: 2023-05-17 — End: 2023-05-27

## 2023-05-17 NOTE — Telephone Encounter (Signed)
I called the patient and spoke with him regarding his culture results and that Dr. Murrell Converse wanted him to be started on an additional antibiotic.  Bactrim DS 1 pill twice daily x 10 days.  I explained the proper use of the medicine and he verbalized his understanding and agreed with the plan.

## 2023-05-22 ENCOUNTER — Ambulatory Visit: Payer: Medicare PPO | Attending: Foot & Ankle Surgery | Admitting: Foot & Ankle Surgery

## 2023-05-22 VITALS — BP 151/80 | HR 81 | Temp 97.7°F

## 2023-05-22 DIAGNOSIS — I890 Lymphedema, not elsewhere classified: Secondary | ICD-10-CM | POA: Insufficient documentation

## 2023-05-22 DIAGNOSIS — L97222 Non-pressure chronic ulcer of left calf with fat layer exposed: Secondary | ICD-10-CM | POA: Insufficient documentation

## 2023-05-22 DIAGNOSIS — I10 Essential (primary) hypertension: Secondary | ICD-10-CM | POA: Insufficient documentation

## 2023-05-22 DIAGNOSIS — L97219 Non-pressure chronic ulcer of right calf with unspecified severity: Secondary | ICD-10-CM

## 2023-05-22 DIAGNOSIS — F17200 Nicotine dependence, unspecified, uncomplicated: Secondary | ICD-10-CM | POA: Insufficient documentation

## 2023-05-22 NOTE — Progress Notes (Deleted)
[facility]  [facility]:  [address], [place], [place] [address]  T [phone]  F [phone]  Wound Follow-up - Nursing Documentation    PATIENT: [name] DOB: [birth year]   MR #: [id number]  AGE: 57 y.o.    REFERRING MD:  Glenford Peers, DPM PRIMARY MD: [name], MD      Chief Complaint   Patient presents with    Wound Check        Assessment & Plan     Case Management:  Name and DOB confirmed. pt states wraps stayed up this week, pt unable to increase frequency of visits d/t financial concerns (~$150-200 per visit). Dr. Murrell Converse suggested [facility] or [facility].  Treatment:  C&S taken of L posterior lateral wound  VFI:EPPIRJJ soak 31min,gent, aquacel AG   RLE-antifungal, hydragaurd, 3 layer compression  LLE- antifungal, hydrahayrd, 3 layer compression  Procedures:  Procedures     Patient Education:  RTC 2 weeks    Reviewed leg elevation and stressed importance when/if wraps fall, pt states it is hard to do with his hip arthritis.    Patient and Caregiver education was given regarding procedures/therapy planned with verbalized understanding.    Glenford Peers, DPM   05/22/2023

## 2023-05-22 NOTE — Progress Notes (Signed)
[facility]  [facility]:  [address], [place], [place] [address]  T [phone]  F [phone]  Wound Follow-up - Nursing Documentation    PATIENT: [name] DOB: 08/26/1966   MR #: [id number]  AGE: 57 y.o.    REFERRING MD:  Glenford Peers, DPM PRIMARY MD: [name], MD      Chief Complaint   Patient presents with    Wound Check        Assessment & Plan     Case Management:  Patient arrived by transport wheelchair and with BLE compression wraps. He states that the cotton underlayer makes his skin very itchy, and that the RLE compression wrap fell down his calf. RN reconciled meds and allergies.  Treatment:  Dakin's soak , gent, aquacel AG.   RLE-antifungal, hydragaurd, coflex zinc compression wrap    LLE- antifungal, hydraguard , acticoat flex 7, coflex zinc compression wrap.    RTC: 2 weeks  Procedures:  Additional Wound Procedures    Performed by: Nelta Numbers, RN  Authorized by: Glenford Peers, DPM    Associated wounds:   Wound 10/31/20 Edema/Edema Management Lower Leg Anterior;Left  Wound 10/31/20 Edema/Edema Management Lower Leg Anterior;Right  Body area:  Lower extremity  Lower extremity location:  R lower leg and L lower leg    Compression wrap type: multi-layer    Wrap location: bilateral    Wrap location equal debrided location?: yes    Debrided location: bilateral    Multi-layer: 2 layer     Procedure was performed in a clean field. The leg was washed with soap and water and then patted dry. Moisturizer was applied to the leg. A multi-layer compression bandage was applied per manufacturer's instructions. Patient tolerated procedure well and without complaints of pain or discomfort. Patient was educated regarding signs and symptoms of overcompression, including unrelieved pain, toes turning red or purple, or numbness in foot.  Patient was instructed to manually remove outer layer (without scissors) and contact clinic immediately.       Patient  Education:  - Elevate legs for 30 mins, 3x/day, above the level of the heart. "Above the level of the heart," is key here. The easiest way to achieve this is to lay down flat and prop your legs up with at least 2 pillows.  - Nutrition: with patients with chronic or slow-healing wounds, we recommend protein. Take your bodyweight, divide by 2 = daily protein goal, in grams.   - If wraps get wet/damaged, or if they're too tight, or if s/he has any questions: please call the [facility]. We are open M-F, 8:30-4:30, closed on weekends. If patient needs care during our off hours, please don't hesitate and go to Urgent Care or Emergency Room. Provided Pt with a printed handout regarding compressing wraps, and to never use scissors to remove wraps if too tight. Patient verbalized understanding.  - Patient and/or Caregiver verbalized understanding.     Nelta Numbers, RN   05/22/2023

## 2023-05-22 NOTE — Progress Notes (Signed)
[facility]    Wound Follow-Up - Provider Documentation    PATIENT: [name] DOB: 07-31-1966   MR #: [id number]  AGE: 57 y.o.    REFERRING MD:  Glenford Peers, DPM PRIMARY MD: [name], MD        Chief Complaint   Patient presents with    Wound Check      The patient was seen in the office today and a follow up evaluation was performed, including a history & focused exam.     [name] is a 57 year old black male who presents today with Patient reports he has had recurrent wounds and swelling of both lower legs for the past two years.  He was being treated at a wound center in [place] but moved to [place] in July 2023.  He has been self treating with gauze/coban dressings and took a prescription for penicillin in January 2024.  PMH significant for morbid obesity, current tobacco user, hypertention, GERD and chronic back pain. Negative for diabetes.   The patient states his chief complaint is "wounds of bilateral lower legs."    History of Present Illness   [name] is a 57 y.o. male who presents to [facility] wound healing center bilateral lower extremity lymphedema, edema and chronic left leg ulcers.  Patient is complaining of itching from cotton compression wrap.  Patient denies fever, chills, nausea, vomiting, and leg pain.  Patient has been treated with Dakin's 10 minutes soaking, gentamicin, Aquacel Ag.    Past Medical History     Past Medical History:   Diagnosis Date    GERD (gastroesophageal reflux disease)     Hypertension     Lower back pain        Past Surgical History   History reviewed. No pertinent surgical history.    Family History   History reviewed. No pertinent family history.    Social History   Social History[1]    Allergies   Allergies[2]    Medications   Current Medications[3]    Physical Exam     Vitals:    05/22/23 1113   BP: 151/80   Pulse: 81   Temp: 97.7 F (36.5 C)       There is no height or weight  on file to calculate BMI.    General:  Patient appears their stated age, well-nourished.  Alert and in no apparent distress.  Lungs: Respiratory effort unlabored, no signs of distress  Vascular: normal capillary refill  Skin: see focused wound care exam  Neuro: AAOx3 normal speech and appropriate affect, see wound care exam for sensory exam    Focused Wound Care Exam   Wound Assessment:   Wound 10/31/22 Lower Leg;Calf Left;Lateral (Active)   Date First Assessed: 10/31/22   Location: Lower Leg;Calf  Wound Location Orientation: Left;Lateral  Present on Original Admission: Yes      Assessments 05/22/2023 11:28 AM 05/22/2023 11:29 AM   Wound Image      Wound Base Description Red --   Peri-wound Description Maceration --   Wound Length (cm) 14 cm 14 cm   Wound Width (cm) 6.5 cm 6.6 cm   Wound Depth (cm) 0.1 cm 0.3 cm   Wound Surface Area (cm^2) 91 cm^2 92.4 cm^2   Wound Volume (cm^3) 9.1 cm^3 27.72 cm^3   Closure Open --   Drainage Amount Large --   Drainage Description Serosanguinous --   Tunneling No --  Undermining  No --   Margins Undefined edges --   Treatments Cleansed --   Topical Other (Comment);Moisturizing cream --   Dressing Silver (Ag) Dressing --       Active Orders   Date Order Priority Status Authorizing Provider   05/22/23 1229 Debridement Routine Active Glenford Peers, DPM       Inactive Orders   Date Order Priority Status Authorizing Provider   05/08/23 1202 DEBRIDEMENT Lower Leg;Calf Left;Lateral Routine Completed Glenford Peers, DPM       Wound 03/13/23 Venous Ulcer Lower Leg;Calf Right;Lateral (Active)   Date First Assessed: 03/13/23   Primary Wound Type: Venous Ulcer  Location: Lower Leg;Calf  Wound Location Orientation: Right;Lateral  Present on Original Admission: Yes      Assessments 05/22/2023 11:27 AM 05/22/2023 11:28 AM   Wound Image      Wound Base Description -- Red   Peri-wound Description -- Maceration   Wound Length (cm) -- 0 cm   Wound Width (cm) -- 0 cm   Wound Depth (cm) -- 0 cm   Wound  Surface Area (cm^2) -- 0 cm^2   Wound Volume (cm^3) -- 0 cm^3   Closure -- Healed   Treatments -- Cleansed   Topical -- Moisturizing cream       No associated orders.       Wound 04/10/23 Lower Leg;Calf Left;Posterior;Lateral (Active)   Date First Assessed: 04/10/23   Location: Lower Leg;Calf  Wound Location Orientation: Left;Posterior;Lateral  Wound Description: scatter  Present on Original Admission: Yes      Assessments 05/22/2023 11:28 AM 05/22/2023 11:29 AM   Wound Image      Wound Length (cm) 3 cm 3 cm   Wound Width (cm) 2 cm 2 cm   Wound Depth (cm) 0.1 cm 0.3 cm   Wound Surface Area (cm^2) 6 cm^2 6 cm^2   Wound Volume (cm^3) 0.6 cm^3 1.8 cm^3   Closure Open --   Drainage Amount Large --   Drainage Description Serosanguinous --   Tunneling No --   Undermining  No --   Margins Undefined edges --   Treatments Cleansed;Wound Cleanser --   Topical Other (Comment);Moisturizing cream --   Dressing Silver (Ag) Dressing --       Active Orders   Date Order Priority Status Authorizing Provider   05/22/23 1230 Debridement Routine Active Glenford Peers, DPM       Wound 04/10/23 Lower Leg;Calf Left;Posterior;Medial (Active)   Date First Assessed: 04/10/23   Location: Lower Leg;Calf  Wound Location Orientation: Left;Posterior;Medial  Wound Description: scatter  Present on Original Admission: Yes      Assessments 05/22/2023 11:28 AM 05/22/2023 11:29 AM   Wound Image      Wound Length (cm) 2 cm 2 cm   Wound Width (cm) 3 cm 3 cm   Wound Depth (cm) 0.1 cm 0.3 cm   Wound Surface Area (cm^2) 6 cm^2 6 cm^2   Wound Volume (cm^3) 0.6 cm^3 1.8 cm^3   Closure Open --   Drainage Amount Large --   Drainage Description Serosanguinous --   Tunneling No --   Undermining  No --   Margins Undefined edges --   Treatments Cleansed;Wound Cleanser --   Topical Other (Comment);Moisturizing cream --   Dressing Silver (Ag) Dressing --       Active Orders   Date Order Priority Status Authorizing Provider   05/22/23 1231 Debridement Routine Active Glenford Peers, DPM       Wound 10/31/20  Edema/Edema Management Lower Leg Anterior;Left (Active)   Date First Assessed/Time First Assessed: 10/31/20 0800   Primary Wound Type: Edema/Edema Management  Edema extremity: LLE  Edema Swelling: Pitting  Location: Lower Leg  Wound Location Orientation: Anterior;Left      Assessments 05/22/2023 11:27 AM 05/22/2023 11:28 AM   Wound Image      Treatments -- Cleansed       Active Orders   Date Order Priority Status Authorizing Provider   05/22/23 1221 Additional Wound Procedures Routine Active Glenford Peers, DPM       Inactive Orders   Date Order Priority Status Authorizing Provider   05/08/23 1230 Additional Wound Procedures Routine Completed Glenford Peers, DPM       Wound 10/31/20 Edema/Edema Management Lower Leg Anterior;Right (Active)   Date First Assessed/Time First Assessed: 10/31/20 0800   Primary Wound Type: Edema/Edema Management  Edema extremity: RLE  Edema Swelling: Pitting  Location: Lower Leg  Wound Location Orientation: Anterior;Right      Assessments 05/22/2023 11:27 AM   Wound Image     Treatments Cleansed       Active Orders   Date Order Priority Status Authorizing Provider   05/22/23 1221 Additional Wound Procedures Routine Active Glenford Peers, DPM       Inactive Orders   Date Order Priority Status Authorizing Provider   05/08/23 1230 Additional Wound Procedures Routine Completed Glenford Peers, DPM       Edema  RLE Edema: Pitting; 2+ indentation < 5mm  R Foot Measurements(cm): 27 cm  R Ankle Measurements(cm): 25 cm  R Calf Measurements(cm): 58 cm  RLE Treatment: RLE Multilayer  RLE Multilayer Tx: Coban 2 (coflex zinc wrap)  LLE Edema: Pitting; 2+ indentation < 5mm  L Foot Measurements(cm): 26 cm  L Ankle Measurements(cm): 26 cm  L Calf Measurements(cm): 50 cm  LLE Treatment: LLE Multilayer  LLE Multilayer Tx: Coban 2 (coflex zinc wrap)                            Labs and Imaging Reviewed   I have reviewed pertinent labs, imaging and cultures.    Procedure Note   I  decided to perform debridement today because, based on the patient's current status, there is an expectation that serial debridement will improve healing potential, reduce or control tissue infection, and prepare the tissue for surgical management. The wound bed is comprised of <25% beefy red and hypergranulation granulation tissue and 100% slough/fibrin and necrotic tissue nonviable tissue. Surgical debridement is required to excise a specific, targeted area of devitalized or necrotic tissue along the margin of viable tissue by sharp dissection. I have reviewed the individualized treatment plan for this patient who requires a debridement. Debridement is needed as part of the plan to control heavy wound colonization. I previously assessed the patient's vascular status which is adequate. I have ensured that this ulcer has been adequately off-loaded. I am continuing to address nutritional issues, monitoring weights, encouraging the use of supplements and drawing laboratory tests when appropriate.     DEBRIDEMENT Lower Leg;Calf Left;Lateral    Performed by: Glenford Peers, DPM  Authorized by: Glenford Peers, DPM    Associated wounds:   Wound 10/31/22 Lower Leg;Calf Left;Lateral    Consent:     Consent obtained:  Verbal and written    Consent given by:  Patient    Risks discussed: Yes      Time out: Immediately prior to the procedure a  time out was called    Time out performed at:  05/22/2023 12:29 PM    Debridement Details:     Performed by:  Physician    Type: excisional      Level: subcutaneous tissue      Pain control:  Lidocaine 2%    Pain control administration: topical        Time taken:  05/22/2023 11:28 AM  Pre-debridement measurements    Length (cm):  14    Width (cm):  6.5    Depth (cm):  0.1    Surface Area (cm^2):  91    Time taken:  05/22/2023 11:29 AM    Post-debridement measurements    Length (cm):  14    Width (cm):  6.6    Depth (cm):  0.3    Percent Debrided (%):  100    Surface Area (cm^2):  92.4    Area  Debrided (cm^2):  92.4    Volume (cm^3):  27.72    Devitalized tissue debrided: biofilm, fibrin and necrotic debris      Instrument:  Curette and scalpel #15    Amount of bleeding: small    Specimen sent for culture:   Specimen sent for culture comment:  No  Specimen sent for pathology:   Specimen sent for pathology comment:  No  Culture obtained:  no  Culture obtained comment:  Culture not obtained.    Hemostasis obtained with:  Pressure    Procedural pain:  0    Post-procedural pain:  0    Response to treatment:  Procedure was tolerated well   The wound bed is comprised of 25-50% beefy red and hypergranulation granulation tissue and >50% necrotic tissue nonviable tissue.    DEBRIDEMENT Lower Leg;Calf Left;Posterior;Lateral scatter    Performed by: Glenford Peers, DPM  Authorized by: Glenford Peers, DPM    Associated wounds:   Wound 04/10/23 Lower Leg;Calf Left;Posterior;Lateral    Consent:     Consent obtained:  Verbal and written    Consent given by:  Patient    Risks discussed: Yes      Time out: Immediately prior to the procedure a time out was called    Time out performed at:  05/22/2023 12:30 PM    Debridement Details:     Performed by:  Physician    Type: excisional      Level: subcutaneous tissue      Pain control:  Lidocaine 2%    Pain control administration: topical        Time taken:  05/22/2023 11:28 AM  Pre-debridement measurements    Length (cm):  3    Width (cm):  2    Depth (cm):  0.1    Surface Area (cm^2):  6    Time taken:  05/22/2023 11:29 AM    Post-debridement measurements    Length (cm):  3    Width (cm):  2    Depth (cm):  0.3    Percent Debrided (%):  100    Surface Area (cm^2):  6    Area Debrided (cm^2):  6    Volume (cm^3):  1.8    Devitalized tissue debrided: biofilm, callus, fibrin and necrotic debris      Instrument:  Curette    Amount of bleeding: small    Specimen sent for culture:   Specimen sent for culture comment:  No  Specimen sent for pathology:   Specimen sent for pathology  comment:  No  Culture  obtained:  no  Culture obtained comment:  Culture not obtained.    Hemostasis obtained with:  Pressure    Procedural pain:  0    Post-procedural pain:  0    Response to treatment:  Procedure was tolerated well   The wound bed is comprised of >50% beefy red and hypergranulation granulation tissue and >50% slough/fibrin and necrotic tissue nonviable tissue.    DEBRIDEMENT Lower Leg;Calf Left;Posterior;Medial scatter    Performed by: Glenford Peers, DPM  Authorized by: Glenford Peers, DPM    Associated wounds:   Wound 04/10/23 Lower Leg;Calf Left;Posterior;Medial    Consent:     Consent obtained:  Verbal and written    Consent given by:  Patient    Risks discussed: Yes      Time out: Immediately prior to the procedure a time out was called    Time out performed at:  05/22/2023 12:31 PM    Debridement Details:     Performed by:  Physician    Type: excisional      Level: subcutaneous tissue      Pain control:  Lidocaine 2%    Pain control administration: topical        Time taken:  05/22/2023 11:28 AM  Pre-debridement measurements    Length (cm):  2    Width (cm):  3    Depth (cm):  0.1    Surface Area (cm^2):  6    Time taken:  05/22/2023 11:29 AM    Post-debridement measurements    Length (cm):  2    Width (cm):  3    Depth (cm):  0.3    Percent Debrided (%):  100    Surface Area (cm^2):  6    Area Debrided (cm^2):  6    Volume (cm^3):  1.8    Devitalized tissue debrided: biofilm, fibrin and necrotic debris      Instrument:  Blade and curette    Amount of bleeding: small    Specimen sent for culture:   Specimen sent for culture comment:  No  Specimen sent for pathology:   Specimen sent for pathology comment:  No  Culture obtained:  no  Culture obtained comment:  Culture not obtained.    Hemostasis obtained with:  Pressure    Procedural pain:  0    Post-procedural pain:  0    Response to treatment:  Procedure was tolerated well   The wound bed is comprised of >50% beefy red and hypergranulation  granulation tissue and >50% slough/fibrin and necrotic tissue nonviable tissue.         Assessment   This is a 57 y.o. male presents with left leg ulcer and chronic leg edema, lymphedema.    I examined the patient for an ulcer that has been resistant to healing despite numerous interventions. The plan is to now examine any underlying characteristics that may impede healing. I decided that the wounds are not self limited and significantly impact the patient's daily living.    The wounds are improving.  Patient's wound culture was reviewed earlier and Rx Bactrim DS.  He is taking Bactrim DS twice daily.    The patient's vascular status was assessed and it was determined that their vascular status is adequate for healing.     I reviewed the leg, ankle and foot circumference measurements to assess the patient's response to compression.  The patient has edema to Bilateral lower extremities. The edema is stable.     Non-healing wounds  are a symptom of their significant comorbid diseases. edema and lymphedema    Plan   After evaluation of the patient and ulcer/wound characteristics, I decided that debridement was was indicated today.     Leg ulcers improving.  Leg edema is stable.    Wound Care Plan:  Advanced Wound dressings appropriate to the needs of the wound have been initiated to provide a microenvironment which can enhance healing including maintaining a moist environment, decreasing bioburden and insulating and protecting the wound.   The treatment plan will change Dakin's 10 minutes soaking, gentamicin, Aquacel Ag applied as stated in nursing notes.     Edema Management:    Edema control is imperative for healing.  Patient instructed to elevate the lower extremities above the level of the heart when possible.  Active compression will be used to gain control of the patient's edema. Edema managed through use of zinc impregnated Coban 2.     Nutrition:    Nutrition status is adequate. I made suggestions regarding  appropriate protein intake, vitamin supplementation, and adequate hydration. Recommendations include healthy diet    Coordination of Care: I recommended none needed at this time    Patient Education:  Dressing management. S/S infection and to call clinic/ED precautions if any were to occur.   Patient verbalized understanding and agreed with POC.   Home health was not indicated at this time..    The patient will continue to be seen on a regular basis.  Follow up at clinic  in 1 week.    Glenford Peers, DPM  05/22/2023       [1]   Social History  Socioeconomic History    Marital status: Married   Tobacco Use    Smoking status: Heavy Smoker     Current packs/day: 1.00     Types: Cigarettes    Smokeless tobacco: Current   Substance and Sexual Activity    Alcohol use: Yes     Comment: occasionally     Drug use: Yes     Comment: marijuana     Social Determinants of Health     Food Insecurity: Unknown (11/07/2022)    Hunger Vital Sign     Worried About Programme researcher, broadcasting/film/video in the Last Year: Never true    Received from [employer]    Social Network    Received from [facility]    HITS   [2] No Known Allergies  [3]   Current Outpatient Medications:     albuterol sulfate HFA (PROVENTIL) 108 (90 Base) MCG/ACT inhaler, Inhale 2 puffs into the lungs every 4 (four) hours as needed for Wheezing or Shortness of Breath (coughing) Dispense with spacer, Disp: 6.7 g, Rfl: 0    amlodipine-benazepril (LOTREL) 10-20 MG per capsule, Take 1 capsule by mouth daily, Disp: , Rfl:     benzonatate (TESSALON) 100 MG capsule, Take 1 capsule (100 mg) by mouth 3 (three) times daily as needed for Cough, Disp: 15 capsule, Rfl: 0    cloNIDine (CATAPRES) 0.3 MG tablet, Take 1 tablet (0.3 mg) by mouth 2 (two) times daily, Disp: , Rfl:     hydrochlorothiazide (HYDRODIURIL) 25 MG tablet, Take 1 tablet (25 mg) by mouth daily, Disp: , Rfl:     omeprazole (PRILOSEC) 40 MG capsule, Take 1 capsule (40 mg) by mouth daily, Disp: , Rfl:      sulfamethoxazole-trimethoprim (Bactrim DS) 800-160 MG per tablet, Take 1 tablet by mouth 2 (two) times daily for 10 days, Disp:  20 tablet, Rfl: 0

## 2023-06-05 ENCOUNTER — Ambulatory Visit: Payer: Medicare PPO | Attending: Foot & Ankle Surgery | Admitting: Foot & Ankle Surgery

## 2023-06-05 VITALS — BP 118/72 | HR 88 | Temp 96.9°F | Resp 18

## 2023-06-05 DIAGNOSIS — R600 Localized edema: Secondary | ICD-10-CM | POA: Insufficient documentation

## 2023-06-05 DIAGNOSIS — L97219 Non-pressure chronic ulcer of right calf with unspecified severity: Secondary | ICD-10-CM | POA: Insufficient documentation

## 2023-06-05 DIAGNOSIS — I890 Lymphedema, not elsewhere classified: Secondary | ICD-10-CM | POA: Insufficient documentation

## 2023-06-05 DIAGNOSIS — L97222 Non-pressure chronic ulcer of left calf with fat layer exposed: Secondary | ICD-10-CM | POA: Insufficient documentation

## 2023-06-05 NOTE — Progress Notes (Signed)
[facility]    Wound Follow-Up - Provider Documentation    PATIENT: [name] DOB: Jan 14, 1966   MR #: [id number]  AGE: 57 y.o.    REFERRING MD:  Glenford Peers, DPM PRIMARY MD: [name], MD        Chief Complaint   Patient presents with    Wound Check    Edema      The patient was seen in the office today and a follow up evaluation was performed, including a history & focused exam.     [name] is a 57 year old black male who presents today with Patient reports he has had recurrent wounds and swelling of both lower legs for the past two years.  He was being treated at a wound center in [place] but moved to [place] in July 2023.  He has been self treating with gauze/coban dressings and took a prescription for penicillin in January 2024.  PMH significant for morbid obesity, current tobacco user, hypertention, GERD and chronic back pain. Negative for diabetes.   The patient states his chief complaint is "wounds of bilateral lower legs."    History of Present Illness   [name] is a 57 y.o. male who presents to [facility] wound healing center bilateral lower extremity lymphedema, edema and chronic left leg ulcers.  Patient denies fever, chills, nausea, vomiting, and leg pain.     Patient has been treated with Dakin's 10 minutes soaking, gentamicin, Aquacel Ag to posterior left leg ulcer.  He is also under multilayer compression for his leg edema/lymphedema control.  Zinc impregnated Coban 2 was applied last time.    Past Medical History     Past Medical History:   Diagnosis Date    GERD (gastroesophageal reflux disease)     Hypertension     Lower back pain        Past Surgical History   No past surgical history on file.    Family History   No family history on file.    Social History   Social History[1]    Allergies   Allergies[2]    Medications   Current Medications[3]    Physical Exam     Vitals:    06/05/23 1111   BP: 118/72    Pulse: 88   Resp: 18   Temp: (!) 96.9 F (36.1 C)       There is no height or weight on file to calculate BMI.    General:  Patient appears their stated age, well-nourished.  Alert and in no apparent distress.  Lungs: Respiratory effort unlabored, no signs of distress  Vascular: normal capillary refill  Skin: see focused wound care exam  Neuro: AAOx3 normal speech and appropriate affect, see wound care exam for sensory exam    Focused Wound Care Exam   Wound Assessment:   Wound 10/31/22 Lower Leg;Calf Left;Lateral (Active)   Date First Assessed: 10/31/22   Location: Lower Leg;Calf  Wound Location Orientation: Left;Lateral  Present on Original Admission: Yes      Assessments 06/05/2023 11:23 AM   Wound Image     Wound Length (cm) 6 cm   Wound Width (cm) 7 cm   Wound Depth (cm) 0.1 cm   Wound Surface Area (cm^2) 42 cm^2   Wound Volume (cm^3) 4.2 cm^3       Inactive Orders   Date Order Priority Status Authorizing Provider   05/22/23 1229 DEBRIDEMENT Lower Leg;Calf  Left;Lateral Routine Completed Glenford Peers, DPM   05/08/23 1202 DEBRIDEMENT Lower Leg;Calf Left;Lateral Routine Completed Glenford Peers, DPM       Wound 03/13/23 Venous Ulcer Lower Leg;Calf Right;Lateral (Active)   Date First Assessed: 03/13/23   Primary Wound Type: Venous Ulcer  Location: Lower Leg;Calf  Wound Location Orientation: Right;Lateral  Present on Original Admission: Yes      Assessments 06/05/2023 11:20 AM   Wound Image     Wound Length (cm) 0.1 cm   Wound Width (cm) 0.1 cm   Wound Depth (cm) 0.1 cm   Wound Surface Area (cm^2) 0.01 cm^2   Wound Volume (cm^3) 0.001 cm^3       No associated orders.       Wound 04/10/23 Lower Leg;Calf Left;Posterior;Lateral (Active)   Date First Assessed: 04/10/23   Location: Lower Leg;Calf  Wound Location Orientation: Left;Posterior;Lateral  Wound Description: scatter  Present on Original Admission: Yes      Assessments 06/05/2023 11:27 AM   Wound Image     Wound Length (cm) 0 cm   Wound Width (cm) 0 cm   Wound  Depth (cm) 0 cm   Wound Surface Area (cm^2) 0 cm^2   Wound Volume (cm^3) 0 cm^3       Inactive Orders   Date Order Priority Status Authorizing Provider   05/22/23 1230 DEBRIDEMENT Lower Leg;Calf Left;Posterior;Lateral scatter Routine Completed Glenford Peers, DPM       Wound 04/10/23 Lower Leg;Calf Left;Posterior;Medial (Active)   Date First Assessed: 04/10/23   Location: Lower Leg;Calf  Wound Location Orientation: Left;Posterior;Medial  Wound Description: scatter  Present on Original Admission: Yes      Assessments 06/05/2023 11:26 AM   Wound Image     Wound Length (cm) 0.1 cm   Wound Width (cm) 0.1 cm   Wound Depth (cm) 0.1 cm   Wound Surface Area (cm^2) 0.01 cm^2   Wound Volume (cm^3) 0.001 cm^3       Inactive Orders   Date Order Priority Status Authorizing Provider   05/22/23 1231 DEBRIDEMENT Lower Leg;Calf Left;Posterior;Medial scatter Routine Completed Glenford Peers, DPM       Wound 10/31/20 Edema/Edema Management Lower Leg Anterior;Left (Active)   Date First Assessed/Time First Assessed: 10/31/20 0800   Primary Wound Type: Edema/Edema Management  Edema extremity: LLE  Edema Swelling: Pitting  Location: Lower Leg  Wound Location Orientation: Anterior;Left      Assessments 06/05/2023 11:23 AM   Wound Image         Inactive Orders   Date Order Priority Status Authorizing Provider   05/22/23 1221 Additional Wound Procedures Routine Completed Glenford Peers, [facility]   05/08/23 1230 Additional Wound Procedures Routine Completed Glenford Peers, DPM       Wound 10/31/20 Edema/Edema Management Lower Leg Anterior;Right (Active)   Date First Assessed/Time First Assessed: 10/31/20 0800   Primary Wound Type: Edema/Edema Management  Edema extremity: RLE  Edema Swelling: Pitting  Location: Lower Leg  Wound Location Orientation: Anterior;Right      Assessments 06/05/2023 11:20 AM   Wound Image         Inactive Orders   Date Order Priority Status Authorizing Provider   05/22/23 1221 Additional Wound Procedures Routine Completed  Glenford Peers, DPM   05/08/23 1230 Additional Wound Procedures Routine Completed Glenford Peers, DPM       Edema  RLE Edema: 1+ barely detectable  R Foot Measurements(cm): 27 cm  R Ankle Measurements(cm): 26.5 cm  R Calf Measurements(cm): 53.5 cm  RLE Treatment: RLE  Multilayer  RLE Multilayer Tx: Other (Coflex)  LLE Edema: 1+ barely detectable  L Foot Measurements(cm): 27 cm  L Ankle Measurements(cm): 26.5 cm  L Calf Measurements(cm): 52.5 cm  LLE Treatment: LLE Multilayer (Coflex)                            Labs and Imaging Reviewed   I have reviewed pertinent labs, imaging and cultures.    Procedure Note   I decided to perform debridement today because, based on the patient's current status, there is an expectation that serial debridement will improve healing potential, reduce or control tissue infection, and prepare the tissue for surgical management. The wound bed is comprised of <25% beefy red and hypergranulation granulation tissue and 100% slough/fibrin and necrotic tissue nonviable tissue. Surgical debridement is required to excise a specific, targeted area of devitalized or necrotic tissue along the margin of viable tissue by sharp dissection. I have reviewed the individualized treatment plan for this patient who requires a debridement. Debridement is needed as part of the plan to control heavy wound colonization. I previously assessed the patient's vascular status which is adequate. I have ensured that this ulcer has been adequately off-loaded. I am continuing to address nutritional issues, monitoring weights, encouraging the use of supplements and drawing laboratory tests when appropriate.     DEBRIDEMENT Lower Leg;Calf Left;Lateral    Performed by: Glenford Peers, DPM  Authorized by: Glenford Peers, DPM    Associated wounds:   Wound 10/31/22 Lower Leg;Calf Left;Lateral    Consent:     Consent obtained:  Verbal and written    Consent given by:  Patient    Risks discussed: Yes      Time out: Immediately  prior to the procedure a time out was called    Time out performed at:  06/05/2023 12:05 PM    Debridement Details:     Performed by:  Physician    Type: excisional      Level: subcutaneous tissue      Pain control:  Lidocaine 2%    Pain control administration: topical        Time taken:  06/05/2023 11:23 AM  Pre-debridement measurements    Length (cm):  6    Width (cm):  7    Depth (cm):  0.1    Surface Area (cm^2):  42    Time taken:  06/05/2023 11:24 AM    Post-debridement measurements    Length (cm):  6    Width (cm):  7    Depth (cm):  0.3    Percent Debrided (%):  100    Surface Area (cm^2):  42    Area Debrided (cm^2):  42    Volume (cm^3):  12.6    Devitalized tissue debrided: biofilm, fibrin and necrotic debris      Instrument:  Scalpel #10 and curette    Amount of bleeding: small    Specimen sent for culture:   Specimen sent for culture comment:  No  Specimen sent for pathology:   Specimen sent for pathology comment:  No  Culture obtained:  no  Culture obtained comment:  Culture not obtained.    Hemostasis obtained with:  Pressure    Procedural pain:  0    Post-procedural pain:  0    Response to treatment:  Procedure was tolerated well   The wound bed is comprised of 25-50% beefy red granulation tissue and >50% slough/fibrin and  necrotic tissue nonviable tissue.         Assessment   This is a 57 y.o. male presents with left leg ulcer and chronic leg edema, lymphedema.    I examined the patient for an ulcer that has been resistant to healing despite numerous interventions. The plan is to now examine any underlying characteristics that may impede healing. I decided that the wounds are not self limited and significantly impact the patient's daily living.    The wounds are improving.  Patient's wound culture was reviewed earlier and Rx Bactrim DS.  He is taking Bactrim DS twice daily.    The patient's vascular status was assessed and it was determined that their vascular status is adequate for healing.     I  reviewed the leg, ankle and foot circumference measurements to assess the patient's response to compression.  The patient has edema to Bilateral lower extremities. The edema is stable.     Non-healing wounds are a symptom of their significant comorbid diseases. edema and lymphedema    Plan   After evaluation of the patient and ulcer/wound characteristics, I decided that debridement was was indicated today.     Leg ulcers improving.  Leg edema is stable.    Wound Care Plan:  Advanced Wound dressings appropriate to the needs of the wound have been initiated to provide a microenvironment which can enhance healing including maintaining a moist environment, decreasing bioburden and insulating and protecting the wound.   The treatment plan will change Dakin's 10 minutes soaking, gentamicin, Aquacel Ag applied as stated in nursing notes.     Edema Management:    Edema control is imperative for healing.  Patient instructed to elevate the lower extremities above the level of the heart when possible.  Active compression will be used to gain control of the patient's edema. Edema managed through use of zinc impregnated Coban 2.     Nutrition:    Nutrition status is adequate. I made suggestions regarding appropriate protein intake, vitamin supplementation, and adequate hydration. Recommendations include healthy diet    Coordination of Care: I recommended none needed at this time    Patient Education:  Dressing management. S/S infection and to call clinic/ED precautions if any were to occur.   Patient verbalized understanding and agreed with POC.   Home health was not indicated at this time..    The patient will continue to be seen on a regular basis.  Follow up at clinic  in 1 week.    Glenford Peers, DPM  06/05/2023       [1]   Social History  Socioeconomic History    Marital status: Married   Tobacco Use    Smoking status: Heavy Smoker     Current packs/day: 1.00     Types: Cigarettes    Smokeless tobacco: Current   Substance  and Sexual Activity    Alcohol use: Yes     Comment: occasionally     Drug use: Yes     Comment: marijuana     Social Determinants of Health     Food Insecurity: Unknown (11/07/2022)    Hunger Vital Sign     Worried About Programme researcher, broadcasting/film/video in the Last Year: Never true    Received from [employer], [employer]    Social Network    Received from [employer], [facility]    HITS   [2] No Known Allergies  [3]   Current Outpatient Medications:  amlodipine-benazepril (LOTREL) 10-20 MG per capsule, Take 1 capsule by mouth daily, Disp: , Rfl:     benzonatate (TESSALON) 100 MG capsule, Take 1 capsule (100 mg) by mouth 3 (three) times daily as needed for Cough, Disp: 15 capsule, Rfl: 0    cloNIDine (CATAPRES) 0.3 MG tablet, Take 1 tablet (0.3 mg) by mouth 2 (two) times daily, Disp: , Rfl:     hydrochlorothiazide (HYDRODIURIL) 25 MG tablet, Take 1 tablet (25 mg) by mouth daily, Disp: , Rfl:     omeprazole (PRILOSEC) 40 MG capsule, Take 1 capsule (40 mg) by mouth daily, Disp: , Rfl:     albuterol sulfate HFA (PROVENTIL) 108 (90 Base) MCG/ACT inhaler, Inhale 2 puffs into the lungs every 4 (four) hours as needed for Wheezing or Shortness of Breath (coughing) Dispense with spacer, Disp: 6.7 g, Rfl: 0

## 2023-06-05 NOTE — Progress Notes (Signed)
[facility]  [facility]:  [address], [place], [place] [address]  T [phone]  F [phone]  Wound Follow-up - Nursing Documentation    PATIENT: [name] DOB: 05/15/1966   MR #: [id number]  AGE: 57 y.o.    REFERRING MD:  Glenford Peers, DPM PRIMARY MD: [name], MD      Chief Complaint   Patient presents with    Wound Check    Edema        Assessment & Plan     Case Management:  Patient arrived by transport wheelchair and with BLE compression wraps. Pt reports improvement on itchiness with coflex wraps. RN reconciled meds and allergies.    Treatment:  LLE: Dakin's soak , gent, aquacel AG.   BLE- triamcinolone, coflex zinc compression wrap      RTC: 2 weeks    Procedures:  Additional Wound Procedures    Performed by: Manfred Arch, RN  Authorized by: Glenford Peers, DPM    Associated wounds:   Wound 10/31/20 Edema/Edema Management Lower Leg Anterior;Left  Wound 10/31/20 Edema/Edema Management Lower Leg Anterior;Right  Body area:  Lower extremity  Lower extremity location:  L lower leg and R lower leg    Compression wrap type: multi-layer    Wrap location: bilateral    Wrap location equal debrided location?: yes    Debrided location: left    Multi-layer: other    Multi-layer comment:  Coflex   Procedure was performed in a clean field. The leg was washed with soap and water and then patted dry. Moisturizer was applied to the leg. A multi-layer compression bandage was applied per manufacturer's instructions. Patient tolerated procedure well and without complaints of pain or discomfort. Patient was educated regarding signs and symptoms of overcompression, including unrelieved pain, toes turning red or purple, or numbness in foot.  Patient was instructed to manually remove outer layer (without scissors) and contact clinic immediately.       Patient Education:  - Elevate legs for 30 mins, 3x/day, above the level of the heart. "Above the level of the  heart," is key here. The easiest way to achieve this is to lay down flat and prop your legs up with at least 2 pillows.  - Nutrition: with patients with chronic or slow-healing wounds, we recommend protein. Take your bodyweight, divide by 2 = daily protein goal, in grams.   - If wraps get wet/damaged, or if they're too tight, or if s/he has any questions: please call the [facility]. We are open M-F, 8:30-4:30, closed on weekends. If patient needs care during our off hours, please don't hesitate and go to Urgent Care or Emergency Room. Provided Pt with a printed handout regarding compressing wraps, and to never use scissors to remove wraps if too tight. Patient verbalized understanding.  - Patient and/or Caregiver verbalized understanding.     Manfred Arch, RN   06/05/2023

## 2023-06-19 ENCOUNTER — Encounter: Payer: Self-pay | Admitting: Foot & Ankle Surgery

## 2023-06-19 ENCOUNTER — Emergency Department
Admission: EM | Admit: 2023-06-19 | Discharge: 2023-06-19 | Disposition: A | Discharge status: Home / Self Care | Payer: Medicare PPO | Attending: Emergency Medicine | Admitting: Emergency Medicine

## 2023-06-19 ENCOUNTER — Ambulatory Visit: Payer: Medicare PPO | Attending: Foot & Ankle Surgery | Admitting: Foot & Ankle Surgery

## 2023-06-19 VITALS — BP 143/77 | HR 71 | Temp 97.8°F | Resp 18 | Ht 76.0 in | Wt >= 6400 oz

## 2023-06-19 DIAGNOSIS — L97222 Non-pressure chronic ulcer of left calf with fat layer exposed: Secondary | ICD-10-CM | POA: Insufficient documentation

## 2023-06-19 DIAGNOSIS — E11622 Type 2 diabetes mellitus with other skin ulcer: Secondary | ICD-10-CM | POA: Insufficient documentation

## 2023-06-19 DIAGNOSIS — R600 Localized edema: Secondary | ICD-10-CM

## 2023-06-19 DIAGNOSIS — I890 Lymphedema, not elsewhere classified: Secondary | ICD-10-CM | POA: Insufficient documentation

## 2023-06-19 DIAGNOSIS — I10 Essential (primary) hypertension: Secondary | ICD-10-CM | POA: Insufficient documentation

## 2023-06-19 DIAGNOSIS — F17210 Nicotine dependence, cigarettes, uncomplicated: Secondary | ICD-10-CM | POA: Insufficient documentation

## 2023-06-19 DIAGNOSIS — N390 Urinary tract infection, site not specified: Secondary | ICD-10-CM | POA: Insufficient documentation

## 2023-06-19 LAB — URINALYSIS WITH REFLEX TO MICROSCOPIC EXAM - REFLEX TO CULTURE
Urine Bilirubin: NEGATIVE
Urine Blood: NEGATIVE
Urine Glucose: NEGATIVE
Urine Ketones: NEGATIVE mg/dL
Urine Nitrite: NEGATIVE
Urine Protein: NEGATIVE
Urine Specific Gravity: 1.015 (ref 1.001–1.035)
Urine Urobilinogen: NORMAL mg/dL (ref 0.2–2.0)
Urine pH: 6.5 (ref 5.0–8.0)

## 2023-06-19 LAB — LAB USE ONLY - URINE GRAY CULTURE HOLD TUBE

## 2023-06-19 MED ORDER — CEFPODOXIME PROXETIL 200 MG PO TABS
200.0000 mg | ORAL_TABLET | Freq: Two times a day (BID) | ORAL | 0 refills | Status: AC
Start: 2023-06-19 — End: 2023-06-26

## 2023-06-19 NOTE — Progress Notes (Signed)
Ambulatory [facility]  [facility]:  [address] [place], [place] [address]  T [phone]  F [phone]  Wound Follow-up - Nursing Documentation    PATIENT: [name] DOB: 05-25-1966   MR #: [id number]  AGE: 57 y.o.    REFERRING MD:  Glenford Peers, DPM PRIMARY MD: [name], MD      Chief Complaint   Patient presents with    Wound Check        Assessment & Plan     Case Management:  RTC: 2 weeks.    Treatment:  LLE: Dakin's soak , gent, zetuvit   BLE- coflex zinc compression wrap    RTC 2 weeks    Additional Wound Procedures    Performed by: Netta Cedars, RN  Authorized by: Glenford Peers, DPM    Body area:  Lower extremity  Lower extremity location:  R lower leg and L lower leg    Compression wrap type: multi-layer    Wrap location: bilateral    Wrap location equal debrided location?: no    Multi-layer: other     Procedure was performed in a clean field. The leg was washed with soap and water and then patted dry. Moisturizer was applied to the leg. A multi-layer compression bandage was applied per manufacturer's instructions. Patient tolerated procedure well and without complaints of pain or discomfort. Patient was educated regarding signs and symptoms of overcompression, including unrelieved pain, toes turning red or purple, or numbness in foot.  Patient was instructed to manually remove outer layer (without scissors) and contact clinic immediately.      Patient Education:  Elevate legs for 30 mins, 3x/day, above the level of the heart.    Netta Cedars, RN   06/19/2023

## 2023-06-19 NOTE — Progress Notes (Signed)
[facility]    Wound Follow-Up - Provider Documentation    PATIENT: [name] DOB: 1966-02-15   MR #: [id number]  AGE: 56 y.o.    REFERRING MD:  Glenford Peers, DPM PRIMARY MD: [name], MD        Chief Complaint   Patient presents with    Wound Check      The patient was seen in the office today and a follow up evaluation was performed, including a history & focused exam.     [name] is a 57 year old black male who presents today with Patient reports he has had recurrent wounds and swelling of both lower legs for the past two years.  He was being treated at a wound center in [place] but moved to [place] in July 2023.  He has been self treating with gauze/coban dressings and took a prescription for penicillin in January 2024.  PMH significant for morbid obesity, current tobacco user, hypertention, GERD and chronic back pain. Negative for diabetes.   The patient states his chief complaint is "wounds of bilateral lower legs."    History of Present Illness   [name] is a 57 y.o. male who presents to [facility] wound healing center bilateral lower extremity lymphedema, edema and chronic left leg ulcers.  Patient denies fever, chills, nausea, vomiting, and leg pain.     Patient has been treated with Dakin's 10 minutes soaking, gentamicin, Aquacel Ag to posterior left leg ulcer.  He is under multilayer compression for his leg edema/lymphedema control.  Zinc impregnated Coban 2.    Past Medical History     Past Medical History:   Diagnosis Date    GERD (gastroesophageal reflux disease)     Hypertension     Lower back pain        Past Surgical History   No past surgical history on file.    Family History   No family history on file.    Social History   Social History[1]    Allergies   Allergies[2]    Medications   Current Medications[3]    Physical Exam     Vitals:    06/19/23 1114   BP: 143/77   Pulse: 71   Resp: 18   Temp: 97.8 F  (36.6 C)       Body mass index is 57.21 kg/m.    General:  Patient appears their stated age, well-nourished.  Alert and in no apparent distress.  Lungs: Respiratory effort unlabored, no signs of distress  Vascular: normal capillary refill  Skin: see focused wound care exam  Neuro: AAOx3 normal speech and appropriate affect, see wound care exam for sensory exam    Focused Wound Care Exam   Wound Assessment:   Wound 10/31/22 Lower Leg;Calf Left;Lateral (Active)   Date First Assessed: 10/31/22   Location: Lower Leg;Calf  Wound Location Orientation: Left;Lateral  Present on Original Admission: Yes      Assessments 06/19/2023 11:26 AM   Wound Image     Wound Base Description Moist;Pink   Peri-wound Description Hyperpigmented   Shape scatter   Wound Length (cm) 11 cm   Wound Width (cm) 12 cm   Wound Depth (cm) 0.1 cm   Wound Surface Area (cm^2) 132 cm^2   Wound Volume (cm^3) 13.2 cm^3   Closure Open   Drainage Amount Moderate   Drainage Description Serosanguinous   Tunneling No   Undermining  No  Treatments Cleansed       Inactive Orders   Date Order Priority Status Authorizing Provider   06/05/23 1205 DEBRIDEMENT Lower Leg;Calf Left;Lateral Routine Completed Glenford Peers, DPM   05/22/23 1229 DEBRIDEMENT Lower Leg;Calf Left;Lateral Routine Completed Glenford Peers, DPM   05/08/23 1202 DEBRIDEMENT Lower Leg;Calf Left;Lateral Routine Completed Glenford Peers, DPM       Wound 03/13/23 Venous Ulcer Lower Leg;Calf Right;Lateral (Active)   Date First Assessed: 03/13/23   Primary Wound Type: Venous Ulcer  Location: Lower Leg;Calf  Wound Location Orientation: Right;Lateral  Present on Original Admission: Yes      Assessments 06/19/2023 11:27 AM   Wound Image     Wound Base Description Dry   Wound Length (cm) 0 cm   Wound Width (cm) 0 cm   Wound Depth (cm) 0 cm   Wound Surface Area (cm^2) 0 cm^2   Wound Volume (cm^3) 0 cm^3   Closure Healed   Treatments Cleansed       No associated orders.       Wound 10/31/20 Edema/Edema  Management Lower Leg Anterior;Left (Active)   Date First Assessed/Time First Assessed: 10/31/20 0800   Primary Wound Type: Edema/Edema Management  Edema extremity: LLE  Edema Swelling: Pitting  Location: Lower Leg  Wound Location Orientation: Anterior;Left      Assessments 06/19/2023 11:26 AM   Wound Image         Inactive Orders   Date Order Priority Status Authorizing Provider   06/05/23 1215 Additional Wound Procedures Routine Completed Glenford Peers, DPM   05/22/23 1221 Additional Wound Procedures Routine Completed Glenford Peers, DPM   05/08/23 1230 Additional Wound Procedures Routine Completed Glenford Peers, DPM       Wound 10/31/20 Edema/Edema Management Lower Leg Anterior;Right (Active)   Date First Assessed/Time First Assessed: 10/31/20 0800   Primary Wound Type: Edema/Edema Management  Edema extremity: RLE  Edema Swelling: Pitting  Location: Lower Leg  Wound Location Orientation: Anterior;Right      Assessments 06/19/2023 11:27 AM   Wound Image         Inactive Orders   Date Order Priority Status Authorizing Provider   06/05/23 1215 Additional Wound Procedures Routine Completed Glenford Peers, DPM   05/22/23 1221 Additional Wound Procedures Routine Completed Glenford Peers, DPM   05/08/23 1230 Additional Wound Procedures Routine Completed Glenford Peers, DPM       Edema  RLE Edema: Brawny; 1+ barely detectable  R Foot Measurements(cm): 27 cm  R Ankle Measurements(cm): 27 cm  R Calf Measurements(cm): 52 cm  LLE Edema: Brawny; 1+ barely detectable  L Foot Measurements(cm): 26.5 cm  L Ankle Measurements(cm): 27 cm  L Calf Measurements(cm): 52 cm                            Labs and Imaging Reviewed   I have reviewed pertinent labs, imaging and cultures.    Procedure Note   I decided to perform debridement today because, based on the patient's current status, there is an expectation that serial debridement will improve healing potential, reduce or control tissue infection, and prepare the tissue for surgical  management. The wound bed is comprised of <25% beefy red and hypergranulation granulation tissue and 100% slough/fibrin and necrotic tissue nonviable tissue. Surgical debridement is required to excise a specific, targeted area of devitalized or necrotic tissue along the margin of viable tissue by sharp dissection. I have reviewed the individualized treatment plan for this patient who  requires a debridement. Debridement is needed as part of the plan to control heavy wound colonization. I previously assessed the patient's vascular status which is adequate. I have ensured that this ulcer has been adequately off-loaded. I am continuing to address nutritional issues, monitoring weights, encouraging the use of supplements and drawing laboratory tests when appropriate.     DEBRIDEMENT Lower Leg;Calf Left;Lateral    Performed by: Glenford Peers, DPM  Authorized by: Glenford Peers, DPM    Associated wounds:   Wound 10/31/22 Lower Leg;Calf Left;Lateral    Consent:     Consent obtained:  Verbal and written    Consent given by:  Patient    Risks discussed: Yes      Time out: Immediately prior to the procedure a time out was called    Time out performed at:  06/19/2023 12:10 PM    Debridement Details:     Performed by:  Physician    Type: excisional      Level: subcutaneous tissue      Pain control:  Lidocaine 2%    Pain control administration: topical        Time taken:  06/19/2023 11:26 AM  Pre-debridement measurements    Length (cm):  11    Width (cm):  12    Depth (cm):  0.1    Surface Area (cm^2):  132    Time taken:  06/19/2023 11:27 AM    Post-debridement measurements    Length (cm):  11    Width (cm):  12    Depth (cm):  0.3    Percent Debrided (%):  20    Surface Area (cm^2):  132    Area Debrided (cm^2):  26.4    Volume (cm^3):  39.6    Devitalized tissue debrided: biofilm, fibrin and necrotic debris      Instrument:  Curette    Amount of bleeding: small    Specimen sent for culture:   Specimen sent for culture comment:   No  Specimen sent for pathology:   Specimen sent for pathology comment:  No  Culture obtained:  no  Culture obtained comment:  Culture not obtained.    Hemostasis obtained with:  Pressure    Procedural pain:  0    Post-procedural pain:  0    Response to treatment:  Procedure was tolerated well   The wound bed is comprised of 25-50% beefy red and hypergranulation granulation tissue and >30% slough/fibrin and necrotic tissue nonviable tissue.         Assessment   This is a 57 y.o. male presents with left leg ulcer and chronic leg edema, lymphedema.    I examined the patient for an ulcer that has been resistant to healing despite numerous interventions. The plan is to now examine any underlying characteristics that may impede healing. I decided that the wounds are not self limited and significantly impact the patient's daily living.    The wounds are improving.  Patient's wound culture was reviewed earlier and Rx Bactrim DS.  He is taking Bactrim DS twice daily.    The patient's vascular status was assessed and it was determined that their vascular status is adequate for healing.     I reviewed the leg, ankle and foot circumference measurements to assess the patient's response to compression.  The patient has edema to Bilateral lower extremities. The edema is stable.     Non-healing wounds are a symptom of their significant comorbid diseases. edema and lymphedema  Plan   After evaluation of the patient and ulcer/wound characteristics, I decided that debridement was was indicated today.     Leg ulcers improving.  Leg edema is stable.  Patient still have moderate amount of drainage from scattered area secondary to lymph leak.  Dakin's 10 minutes soaking, gentamicin, Zetuvit suprapubic tube dressing applied    Wound Care Plan:  Advanced Wound dressings appropriate to the needs of the wound have been initiated to provide a microenvironment which can enhance healing including maintaining a moist environment, decreasing  bioburden and insulating and protecting the wound.   The treatment plan will change Dakin's 10 minutes soaking, gentamicin, Zetuvit applied as stated in nursing notes.     Edema Management:    Edema control is imperative for healing.  Patient instructed to elevate the lower extremities above the level of the heart when possible.  Active compression will be used to gain control of the patient's edema. Edema managed through use of zinc impregnated Coban 2.     Nutrition:    Nutrition status is adequate. I made suggestions regarding appropriate protein intake, vitamin supplementation, and adequate hydration. Recommendations include healthy diet    Coordination of Care: I recommended none needed at this time    Patient Education:  Dressing management. S/S infection and to call clinic/ED precautions if any were to occur.   Patient verbalized understanding and agreed with POC.   Home health was not indicated at this time..    The patient will continue to be seen on a regular basis.  Follow up at clinic  in 1 week.    Glenford Peers, DPM  06/19/2023       [1]   Social History  Socioeconomic History    Marital status: Married   Tobacco Use    Smoking status: Heavy Smoker     Current packs/day: 1.00     Types: Cigarettes    Smokeless tobacco: Current   Substance and Sexual Activity    Alcohol use: Yes     Comment: occasionally     Drug use: Yes     Comment: marijuana     Social Determinants of Health     Food Insecurity: Unknown (11/07/2022)    Hunger Vital Sign     Worried About Programme researcher, broadcasting/film/video in the Last Year: Never true    Received from [employer], [facility]    Social Network    Received from [employer], [facility]    HITS   [2] No Known Allergies  [3]   Current Outpatient Medications:     amlodipine-benazepril (LOTREL) 10-20 MG per capsule, Take 1 capsule by mouth daily, Disp: , Rfl:     benzonatate (TESSALON) 100 MG capsule, Take 1 capsule (100 mg) by mouth 3 (three) times daily as needed for Cough, Disp:  15 capsule, Rfl: 0    cloNIDine (CATAPRES) 0.3 MG tablet, Take 1 tablet (0.3 mg) by mouth 2 (two) times daily, Disp: , Rfl:     hydrochlorothiazide (HYDRODIURIL) 25 MG tablet, Take 1 tablet (25 mg) by mouth daily, Disp: , Rfl:     omeprazole (PRILOSEC) 40 MG capsule, Take 1 capsule (40 mg) by mouth daily, Disp: , Rfl:     albuterol sulfate HFA (PROVENTIL) 108 (90 Base) MCG/ACT inhaler, Inhale 2 puffs into the lungs every 4 (four) hours as needed for Wheezing or Shortness of Breath (coughing) Dispense with spacer, Disp: 6.7 g, Rfl: 0

## 2023-06-19 NOTE — ED Provider Notes (Signed)
EMERGENCY DEPARTMENT NOTE     Patient initially seen and examined at   ED PHYSICIAN ASSIGNED       Date/Time Event User Comments    06/19/23 1235 Physician Assigned MELLER, RYAN A. Ronnie Derby, MD assigned as Attending           ED MIDLEVEL (APP) ASSIGNED       Date/Time Event User Comments    06/19/23 1235 PA/NP Provider Assigned Fenwood, Yue Glasheen A Karan Ramnauth, Jonna Coup, PA assigned as Physician Assistant            HISTORY OF PRESENT ILLNESS   {Translator Used (Optional):59393}    Chief Complaint: Flank Pain and Dysuria       57 y.o. male with HTN and GERD presents emergency department for pressure when he urinates and flank pain.  States that he developed right-sided flank pain 3 days ago, which has resolved but when he urinates at the end of his stream he feels a pressure in the end of his penis.  States it is not painful or burning sensation, but it is uncomfortable.  States that his urine has been hazy and cloudy as well.  He was recently diagnosed with a UTI and states this feels similar.  He has not tried medication yet for his symptoms.  Denies fever, chills, CP, SOB, abdominal pain, N/V/D, hematuria, difficulty urinating, testicular pain/swelling, weakness.  Denies history of kidney stones.  Denies history of abdominal surgeries.  Reports regular BM.    Independent Historian (other than patient): {Historian:59024}  Additional History Provided by Independent Historian:  MEDICAL HISTORY     Past Medical History:  Past Medical History:   Diagnosis Date    GERD (gastroesophageal reflux disease)     Hypertension     Lower back pain        Past Surgical History:  Past Surgical History[1]    Social History:  Social History[2]    Family History:  Family History[3]    Outpatient Medication:  Previous Medications    ALBUTEROL SULFATE HFA (PROVENTIL) 108 (90 BASE) MCG/ACT INHALER    Inhale 2 puffs into the lungs every 4 (four) hours as needed for Wheezing or Shortness of Breath (coughing) Dispense with spacer     AMLODIPINE-BENAZEPRIL (LOTREL) 10-20 MG PER CAPSULE    Take 1 capsule by mouth daily    BENZONATATE (TESSALON) 100 MG CAPSULE    Take 1 capsule (100 mg) by mouth 3 (three) times daily as needed for Cough    CLONIDINE (CATAPRES) 0.3 MG TABLET    Take 1 tablet (0.3 mg) by mouth 2 (two) times daily    HYDROCHLOROTHIAZIDE (HYDRODIURIL) 25 MG TABLET    Take 1 tablet (25 mg) by mouth daily    OMEPRAZOLE (PRILOSEC) 40 MG CAPSULE    Take 1 capsule (40 mg) by mouth daily         REVIEW OF SYSTEMS   Review of Systems See History of Present Illness  PHYSICAL EXAM     ED Triage Vitals [06/19/23 1226]   Encounter Vitals Group      BP (!) 150/91      Systolic BP Percentile       Diastolic BP Percentile       Heart Rate 77      Resp Rate 16      Temp 97.5 F (36.4 C)      Temp src Oral      SpO2 98 %      Weight Marland Kitchen)  204.1 kg      Height 1.93 m      Head Circumference       Peak Flow       Pain Score 5      Pain Loc       Pain Education       Exclude from Growth Chart      Physical Exam  Vitals and nursing note reviewed.   Constitutional:       Appearance: Normal appearance.   HENT:      Head: Normocephalic and atraumatic.   Cardiovascular:      Rate and Rhythm: Normal rate.      Heart sounds: Normal heart sounds.   Pulmonary:      Effort: Pulmonary effort is normal.      Breath sounds: Normal breath sounds.   Abdominal:      Palpations: Abdomen is soft.      Tenderness: There is no abdominal tenderness. There is no guarding or rebound.   Genitourinary:     Penis: No tenderness, discharge or swelling.       Testes:         Right: Tenderness or swelling not present.         Left: Tenderness or swelling not present.      Epididymis:      Right: Normal.      Left: Normal.   Skin:     General: Skin is warm.      Comments: No vesicular rash or erythema to flank   Neurological:      Mental Status: He is alert and oriented to person, place, and time.      Gait: Gait is intact.      Comments: Moving all extremities freely   Psychiatric:          Mood and Affect: Mood normal.         Behavior: Behavior normal.          MEDICAL DECISION MAKING     PRIMARY PROBLEM LIST      {CEP ACUITY:59028} DIAGNOSIS:***  {Chronic Illness Impacting Care of the above problem:59030} {Explain (Optional):59078}  {Differential Diagnosis:59053}    DISCUSSION      ***    {If patient is being hospitalized is severe sepsis or septic shock suspected?:59467}      {Was management discussed with a consultant?:59037}  {Was the decision around the need for surgery discussed with consultant:59056::"N/A"}  {External Records Reviewed?:59023}    Additional Notes    {Diagnostic test considered and not performed:59031::"N/A"}  {Prescription medications considered and not given:59033::"N/A"}  {Hospitalization considered but not done:59032::"N/A"}  {Social Determinants of Health Considerations:59036::"N/A"}  {Was there decision to not resuscitate or to de-escalate care due to poor prognosis?:59057}         Vital Signs: Reviewed the patient's vital signs.   Nursing Notes: Reviewed and utilized available nursing notes.  Medical Records Reviewed: Reviewed available past medical records.  Counseling: The emergency provider has spoken with the patient and discussed today's findings, in addition to providing specific details for the plan of care.  Questions are answered and there is agreement with the plan.      CARDIAC STUDIES    The following cardiac studies were independently interpreted by me the Emergency Medicine Provider.  For full cardiac study results please see chart.    {Monitor Strip Interpretation:59688}  {Rate:59685}  {Rhythm:59687}  {ST segments:59689}    {EKG interpretation:59684}  {Comparison:59859}  {Time:64077}  {Rate:59685}  {Rhythm:59687}  {  ST segments:59689}  {STEMI?:64073}  {EKG interpretation:59690}    {EKG interpretation:59684}  {Comparison:59859}  {Time:64077}  {Rate:59685}  {Rhythm:59687}  {ST segments:59689}  {STEMI?:64073}  {EKG interpretation:59690}    RADIOLOGY  IMAGING STUDIES      No orders to display       EMERGENCY IMAGING STUDIES    The following imagine studies were independently interpreted by me (emergency medicine provider):    {Xray interpreted by ED provider? (Optional):59468} {SIDE (Optional):59475}  {Comparison:59859}  {RESULT:59469}  {IMPRESSION:59470}    {CT interpreted by provider? (Optional):59471}  {Comparison:59859}  {RESULT:59473}  {IMPRESSION:59474}    EMERGENCY DEPT. MEDICATIONS      ED Medication Orders (From admission, onward)      None            LABORATORY RESULTS    Ordered and independently interpreted AVAILABLE laboratory tests.   Results       ** No results found for the last 24 hours. **              CRITICAL CARE/PROCEDURES    Procedures  ***Critical care?  DIAGNOSIS      Diagnosis:  Final diagnoses:   None       Disposition:  ED Disposition       None            Prescriptions:  Patient's Medications   New Prescriptions    No medications on file   Previous Medications    ALBUTEROL SULFATE HFA (PROVENTIL) 108 (90 BASE) MCG/ACT INHALER    Inhale 2 puffs into the lungs every 4 (four) hours as needed for Wheezing or Shortness of Breath (coughing) Dispense with spacer    AMLODIPINE-BENAZEPRIL (LOTREL) 10-20 MG PER CAPSULE    Take 1 capsule by mouth daily    BENZONATATE (TESSALON) 100 MG CAPSULE    Take 1 capsule (100 mg) by mouth 3 (three) times daily as needed for Cough    CLONIDINE (CATAPRES) 0.3 MG TABLET    Take 1 tablet (0.3 mg) by mouth 2 (two) times daily    HYDROCHLOROTHIAZIDE (HYDRODIURIL) 25 MG TABLET    Take 1 tablet (25 mg) by mouth daily    OMEPRAZOLE (PRILOSEC) 40 MG CAPSULE    Take 1 capsule (40 mg) by mouth daily   Modified Medications    No medications on file   Discontinued Medications    No medications on file           This note was generated by the Epic EMR system/ Dragon speech recognition and may contain inherent errors or omissions not intended by the user. Grammatical errors, random word insertions, deletions and pronoun  errors  are occasional consequences of this technology due to software limitations. Not all errors are caught or corrected. If there are questions or concerns about the content of this note or information contained within the body of this dictation they should be addressed directly with the author for clarification.    {THIS REVIEW SECTION WILL AUTODELETE ONCE NOTE IS SIGNED    END REVIEW SECTION(Optional):55325}         [1] History reviewed. No pertinent surgical history.  [2]   Social History  Socioeconomic History    Marital status: Married   Tobacco Use    Smoking status: Heavy Smoker     Current packs/day: 1.00     Types: Cigarettes    Smokeless tobacco: Current   Substance and Sexual Activity    Alcohol use: Yes     Comment: occasionally  Drug use: Yes     Comment: marijuana     Social Determinants of Health     Food Insecurity: Unknown (11/07/2022)    Hunger Vital Sign     Worried About Running Out of Food in the Last Year: Never true    Received from [employer], [facility]    Social Network    Received from [employer], [facility]    HITS   [3] No family history on file.

## 2023-06-20 LAB — CHLAMYDIA TRACHOMATIS, NEISSERIA GONORRHEA AND TRICHOMONAS VAGINALIS, PCR
Chlamydia trachomatis DNA: NEGATIVE
Neisseria gonorrhoeae DNA: NEGATIVE
Trichomonas vaginalis DNA: NEGATIVE

## 2023-06-21 LAB — CULTURE, URINE: Culture Urine: >100000 — AB

## 2023-06-22 NOTE — ED Notes (Signed)
EMERGENCY DEPARTMENT ATTENDING PHYSICIAN NOTE     I performed the substantive portion of the visit. Medical decision-making: For the problems addressed, I personally developed, reviewed, and/or approved the plan and assessment as documented by the APP. Patient was seen by APP, see their note for documentation of history and exam.    BRIEF HISTORY OF PRESENT ILLNESS AND ADDITIONAL EXAM FINDINGS     Chief Complaint: Flank Pain and Dysuria       History per APP    57 y.o. male with HTN and GERD presents emergency department for pressure when he urinates and flank pain.  States that he developed right-sided flank pain 3 days ago, which has resolved but when he urinates at the end of his stream he feels a pressure in the end of his penis.  States it is not painful or burning sensation, but it is uncomfortable.  States that his urine has been hazy and cloudy as well.  He was recently diagnosed with a UTI and states this feels similar.  He has not tried medication yet for his symptoms.  Denies fever, chills, CP, SOB, abdominal pain, N/V/D, hematuria, difficulty urinating, testicular pain/swelling, weakness.  Denies history of kidney stones.  Denies history of abdominal surgeries.  Reports regular BM.        Triage Vitals:  ED Triage Vitals [06/19/23 1226]   Encounter Vitals Group      BP (!) 150/91      Systolic BP Percentile       Diastolic BP Percentile       Heart Rate 77      Resp Rate 16      Temp 97.5 F (36.4 C)      Temp src Oral      SpO2 98 %      Weight (!) 204.1 kg      Height 1.93 m      Head Circumference       Peak Flow       Pain Score 5      Pain Loc       Pain Education       Exclude from Growth Chart             MEDICAL DECISION MAKING   Discussed presentation, workup, results, and plan with the Advanced Practice Provider, Scott  Patient stable for discharge with outpatient treatment plan and close follow up.        Vital Signs: Reviewed the patient's vital signs.   Nursing Notes: Reviewed and utilized  available nursing notes.  Medical Records Reviewed: Reviewed available past medical records.    CARDIAC STUDIES    The following cardiac studies were independently interpreted by me the Emergency Medicine Provider.  For full cardiac study results please see chart. I discussed testing results with the APP.              EMERGENCY IMAGING STUDIES    The following imagine studies were independently interpreted by me (emergency medicine provider). I discussed testing results with the APP.                     RADIOLOGY IMAGING STUDIES      No orders to display       EMERGENCY DEPT. MEDICATIONS      ED Medication Orders (From admission, onward)      None            LABORATORY RESULTS    Ordered and independently interpreted  AVAILABLE laboratory tests.   Results       Procedure Component Value Units Date/Time    Urinalysis with Reflex to Microscopic Exam and Culture [[id number]]  (Abnormal) Collected: 06/19/23 1315    Specimen: Urine, Clean Catch Updated: 06/19/23 1338     Urine Color Straw     Urine Clarity Clear     Urine Specific Gravity 1.015     Urine pH 6.5     Urine Leukocyte Esterase Large     Urine Nitrite Negative     Urine Protein Negative     Urine Glucose Negative     Urine Ketones Negative mg/dL      Urine Urobilinogen Normal mg/dL      Urine Bilirubin Negative     Urine Blood Negative     RBC, UA 0-2 /hpf      Urine WBC Too numerous to count /hpf      Urine Squamous Epithelial Cells 0-5 /hpf               CRITICAL CARE/PROCEDURES        DIAGNOSIS    I (ED Physician) discussed final disposition with the APP    Diagnosis:  Final diagnoses:   Urinary tract infection without hematuria, site unspecified       Disposition:  ED Disposition       ED Disposition   Discharge    Condition   --    Date/Time   Wed Jun 19, 2023  2:13 PM    Comment   [name] discharge to home/self care.    Condition at disposition: Stable                 Prescriptions:  Discharge Medication List as of 06/19/2023  2:13 PM        START  taking these medications    Details   cefpodoxime (VANTIN) 200 MG tablet Take 1 tablet (200 mg) by mouth 2 (two) times daily for 7 days, Starting Wed 06/19/2023, Until Wed 06/26/2023, E-Rx           CONTINUE these medications which have NOT CHANGED    Details   albuterol sulfate HFA (PROVENTIL) 108 (90 Base) MCG/ACT inhaler Inhale 2 puffs into the lungs every 4 (four) hours as needed for Wheezing or Shortness of Breath (coughing) Dispense with spacer, Starting Wed 11/07/2022, Until Fri 12/07/2022 at 2359, E-Rx      amlodipine-benazepril (LOTREL) 10-20 MG per capsule Take 1 capsule by mouth daily, Historical Med      benzonatate (TESSALON) 100 MG capsule Take 1 capsule (100 mg) by mouth 3 (three) times daily as needed for Cough, Starting Wed 11/07/2022, E-Rx      cloNIDine (CATAPRES) 0.3 MG tablet Take 1 tablet (0.3 mg) by mouth 2 (two) times daily, Historical Med      hydrochlorothiazide (HYDRODIURIL) 25 MG tablet Take 1 tablet (25 mg) by mouth daily, Historical Med      omeprazole (PRILOSEC) 40 MG capsule Take 1 capsule (40 mg) by mouth daily, Historical Med                Ronnie Derby, MD  06/22/23 1319

## 2023-06-24 NOTE — Progress Notes (Signed)
[name] is a 57 year old male with chief complaints of right-sided flank pain 3 days ago with uncomfortable urination. Recent dx of UTI per patient has not tried medications for symptoms yet.     Urine cultures finalized with E.coli susceptible to ceftriaxone. Patient was discharged on Cefpodoxime 200 mg BID x 7 days.    Organism is susceptible to this antibiotic. No further follow up required.    [name] [name], [facility]  06/24/23 @ 1257

## 2023-07-03 ENCOUNTER — Ambulatory Visit: Payer: Medicare PPO | Attending: Vascular Surgery | Admitting: Foot & Ankle Surgery

## 2023-07-03 VITALS — BP 143/73 | HR 82 | Temp 97.0°F | Resp 18 | Wt >= 6400 oz

## 2023-07-03 DIAGNOSIS — R600 Localized edema: Secondary | ICD-10-CM

## 2023-07-03 DIAGNOSIS — I890 Lymphedema, not elsewhere classified: Secondary | ICD-10-CM | POA: Insufficient documentation

## 2023-07-03 DIAGNOSIS — F17210 Nicotine dependence, cigarettes, uncomplicated: Secondary | ICD-10-CM | POA: Insufficient documentation

## 2023-07-03 DIAGNOSIS — L97222 Non-pressure chronic ulcer of left calf with fat layer exposed: Secondary | ICD-10-CM | POA: Insufficient documentation

## 2023-07-03 NOTE — Progress Notes (Signed)
Ambulatory [facility]  [facility]:  [address] [place], [place] [address]  T [phone]  F [phone]  Wound Follow-up - Nursing Documentation    PATIENT: [name] DOB: Jan 16, 1966   MR #: [id number]  AGE: 57 y.o.    REFERRING MD:  Glenford Peers, DPM PRIMARY MD: [name], MD      No chief complaint on file.       Assessment & Plan     Case Management: patient arrived ambulatory. No complaints of pain. States zinc wraps are working very well.    Treatment:  LLE: Dakin's soak , gent, sorbex  BLE- coflex zinc compression wrap    RTC 2 weeks    Additional Wound Procedures    Performed by: Diamantina Monks, RN  Authorized by: Glenford Peers, DPM    Body area:  Lower extremity    Compression wrap type: multi-layer    Wrap location: bilateral    Wrap location equal debrided location?: yes    Debrided location: left    Multi-layer: 2 layer     Procedure was performed in a clean field. The leg was washed with soap and water and then patted dry. Moisturizer was applied to the leg. A multi-layer compression bandage was applied per manufacturer's instructions. Patient tolerated procedure well and without complaints of pain or discomfort. Patient was educated regarding signs and symptoms of overcompression, including unrelieved pain, toes turning red or purple, or numbness in foot.  Patient was instructed to manually remove outer layer (without scissors) and contact clinic immediately.       Patient Education:  Elevate legs for 30 mins, 3x/day, above the level of the heart.    Diamantina Monks, RN   07/03/2023

## 2023-07-03 NOTE — Progress Notes (Signed)
Ambulatory [name]    Wound Follow-Up - Provider Documentation    PATIENT: [name] DOB: 1966/06/10   MR #: [id number]  AGE: 57 y.o.    REFERRING MD:  Glenford Peers, DPM PRIMARY MD: [name], MD        No chief complaint on file.     The patient was seen in the office today and a follow up evaluation was performed, including a history & focused exam.     [name] is a 57 year old black male who presents today with Patient reports he has had recurrent wounds and swelling of both lower legs for the past two years.  He was being treated at a wound center in [place] but moved to [place] in July 2023.  He has been self treating with gauze/coban dressings and took a prescription for penicillin in January 2024.  PMH significant for morbid obesity, current tobacco user, hypertention, GERD and chronic back pain. Negative for diabetes.   The patient states his chief complaint is "wounds of bilateral lower legs."    History of Present Illness   [name] is a 57 y.o. male who presents to [facility] wound healing center bilateral lower extremity lymphedema, edema and chronic left leg ulcers.  Patient denies fever, chills, nausea, vomiting, and leg pain.     Patient has been treated with Dakin's 10 minutes soaking, gentamicin, Zetuvit.  He is under multilayer compression for his leg edema/lymphedema control.  Zinc impregnated Coban 2.    Past Medical History     Past Medical History:   Diagnosis Date    GERD (gastroesophageal reflux disease)     Hypertension     Lower back pain        Past Surgical History   No past surgical history on file.    Family History   No family history on file.    Social History   Social History[1]    Allergies   Allergies[2]    Medications   Current Medications[3]    Physical Exam     Vitals:    07/03/23 1115   BP: 143/73   Pulse: 82   Resp: 18   Temp: 97 F (36.1 C)       Body mass index is 57.21 kg/m.    General:   Patient appears their stated age, well-nourished.  Alert and in no apparent distress.  Lungs: Respiratory effort unlabored, no signs of distress  Vascular: normal capillary refill  Skin: see focused wound care exam  Neuro: AAOx3 normal speech and appropriate affect, see wound care exam for sensory exam    Focused Wound Care Exam   Wound Assessment:   Wound 10/31/22 Lower Leg;Calf Left;Lateral (Active)   Date First Assessed: 10/31/22   Location: Lower Leg;Calf  Wound Location Orientation: Left;Lateral  Present on Original Admission: Yes      Assessments 07/03/2023 11:23 AM   Wound Image     Wound Base Description Moist;Maroon;Partial Thickness;Pink   Peri-wound Description Dry;Edema;Peeling/Flaking   Wound Length (cm) 5 cm   Wound Width (cm) 9.1 cm   Wound Depth (cm) 0.1 cm   Wound Surface Area (cm^2) 45.5 cm^2   Wound Volume (cm^3) 4.55 cm^3   Closure Open   Drainage Amount Large   Drainage Description Serosanguinous;Thick   Tunneling No   Undermining  No   Treatments Cleansed;Compression Wrap1;Dressing changed   Compression Wrap 1 Coflex TLC x/Zinc   Topical  Other (Comment)   Dressing Abdominal Dressing;Other (Comment)       Inactive Orders   Date Order Priority Status Authorizing Provider   06/19/23 1209 DEBRIDEMENT Lower Leg;Calf Left;Lateral Routine Completed Glenford Peers, DPM   06/05/23 1205 DEBRIDEMENT Lower Leg;Calf Left;Lateral Routine Completed Glenford Peers, DPM   05/22/23 1229 DEBRIDEMENT Lower Leg;Calf Left;Lateral Routine Completed Glenford Peers, DPM   05/08/23 1202 DEBRIDEMENT Lower Leg;Calf Left;Lateral Routine Completed Glenford Peers, DPM       Wound 10/31/20 Edema/Edema Management Lower Leg Anterior;Left (Active)   Date First Assessed/Time First Assessed: 10/31/20 0800   Primary Wound Type: Edema/Edema Management  Edema extremity: LLE  Edema Swelling: Pitting  Location: Lower Leg  Wound Location Orientation: Anterior;Left      Assessments 07/03/2023 11:23 AM   Wound Image     Compression Wrap 1  Coflex TLC x/Zinc   Topical Moisturizing cream       Inactive Orders   Date Order Priority Status Authorizing Provider   06/05/23 1215 Additional Wound Procedures Routine Completed Glenford Peers, DPM   05/22/23 1221 Additional Wound Procedures Routine Completed Glenford Peers, DPM   05/08/23 1230 Additional Wound Procedures Routine Completed Glenford Peers, DPM       Wound 10/31/20 Edema/Edema Management Lower Leg Anterior;Right (Active)   Date First Assessed/Time First Assessed: 10/31/20 0800   Primary Wound Type: Edema/Edema Management  Edema extremity: RLE  Edema Swelling: Pitting  Location: Lower Leg  Wound Location Orientation: Anterior;Right      Assessments 07/03/2023 11:23 AM   Wound Image     Treatments Compression Wrap1;Cleansed   Compression Wrap 1 Coflex TLC x/Zinc   Topical Moisturizing cream       Inactive Orders   Date Order Priority Status Authorizing Provider   06/05/23 1215 Additional Wound Procedures Routine Completed Glenford Peers, DPM   05/22/23 1221 Additional Wound Procedures Routine Completed Glenford Peers, DPM   05/08/23 1230 Additional Wound Procedures Routine Completed Glenford Peers, DPM       Edema  RLE Edema: Pitting; 1+ barely detectable  R Foot Measurements(cm): 27 cm  R Ankle Measurements(cm): 27.5 cm  R Calf Measurements(cm): 51.5 cm  RLE Treatment: RLE Multilayer  RLE Multilayer Tx: Coban 2  LLE Edema: Pitting; 1+ barely detectable  L Foot Measurements(cm): 27.5 cm  L Ankle Measurements(cm): 27 cm  L Calf Measurements(cm): 49 cm  LLE Treatment: LLE Multilayer  LLE Multilayer Tx: Coban 2                            Labs and Imaging Reviewed   I have reviewed pertinent labs, imaging and cultures.    Procedure Note   I decided to perform debridement today because, based on the patient's current status, there is an expectation that serial debridement will improve healing potential, reduce or control tissue infection, and prepare the tissue for surgical management. The wound bed is  comprised of <25% beefy red and hypergranulation granulation tissue and 100% slough/fibrin and necrotic tissue nonviable tissue. Surgical debridement is required to excise a specific, targeted area of devitalized or necrotic tissue along the margin of viable tissue by sharp dissection. I have reviewed the individualized treatment plan for this patient who requires a debridement. Debridement is needed as part of the plan to control heavy wound colonization. I previously assessed the patient's vascular status which is adequate. I have ensured that this ulcer has been adequately off-loaded. I am continuing to address nutritional issues, monitoring  weights, encouraging the use of supplements and drawing laboratory tests when appropriate.     DEBRIDEMENT Lower Leg;Calf Left;Lateral    Performed by: Glenford Peers, DPM  Authorized by: Glenford Peers, DPM    Associated wounds:   Wound 10/31/22 Lower Leg;Calf Left;Lateral    Consent:     Consent obtained:  Verbal and written    Consent given by:  Patient    Risks discussed: Yes      Time out: Immediately prior to the procedure a time out was called    Time out performed at:  07/03/2023 11:41 AM    Debridement Details:     Performed by:  Physician    Type: excisional      Level: subcutaneous tissue      Pain control:  Lidocaine 2%    Pain control administration: topical        Time taken:  07/03/2023 11:23 AM  Pre-debridement measurements    Length (cm):  5    Width (cm):  9.1    Depth (cm):  0.1    Surface Area (cm^2):  45.5    Time taken:  07/03/2023 11:24 AM    Post-debridement measurements    Length (cm):  5    Width (cm):  9.2    Depth (cm):  0.3    Percent Debrided (%):  100    Surface Area (cm^2):  46    Area Debrided (cm^2):  46    Volume (cm^3):  13.8    Devitalized tissue debrided: biofilm, fibrin and necrotic debris      Instrument:  Curette and scalpel #15    Amount of bleeding: small    Specimen sent for culture:   Specimen sent for culture comment:   No  Specimen sent for pathology:   Specimen sent for pathology comment:  No  Culture obtained:  no  Culture obtained comment:  Culture not obtained.    Hemostasis obtained with:  Pressure    Procedural pain:  0    Post-procedural pain:  0    Response to treatment:  Procedure was tolerated well   The wound bed is comprised of <25% beefy red and friable granulation tissue and >50% necrotic tissue nonviable tissue.         Assessment   This is a 57 y.o. male presents with left leg ulcer and chronic leg edema, lymphedema.    I examined the patient for an ulcer that has been resistant to healing despite numerous interventions. The plan is to now examine any underlying characteristics that may impede healing. I decided that the wounds are not self limited and significantly impact the patient's daily living.    The wounds are improving.  Patient's wound culture was reviewed earlier and Rx Bactrim DS.  He is taking Bactrim DS twice daily.    The patient's vascular status was assessed and it was determined that their vascular status is adequate for healing.     I reviewed the leg, ankle and foot circumference measurements to assess the patient's response to compression.  The patient has edema to Bilateral lower extremities. The edema is stable.     Non-healing wounds are a symptom of their significant comorbid diseases. edema and lymphedema    Plan   After evaluation of the patient and ulcer/wound characteristics, I decided that debridement was was indicated today.     Leg ulcers improving.  Leg edema is stable.  Patient still have moderate amount of drainage from scattered area  secondary to lymph leak.  Dakin's 10 minutes soaking, gentamicin, Zetuvit suprapubic tube dressing applied    Wound Care Plan:  Advanced Wound dressings appropriate to the needs of the wound have been initiated to provide a microenvironment which can enhance healing including maintaining a moist environment, decreasing bioburden and insulating and  protecting the wound.   The treatment plan will change Dakin's 10 minutes soaking, gentamicin, Zetuvit applied as stated in nursing notes.     Edema Management:    Edema control is imperative for healing.  Patient instructed to elevate the lower extremities above the level of the heart when possible.  Active compression will be used to gain control of the patient's edema. Edema managed through use of zinc impregnated Coban 2.     Nutrition:    Nutrition status is adequate. I made suggestions regarding appropriate protein intake, vitamin supplementation, and adequate hydration. Recommendations include healthy diet    Coordination of Care: I recommended none needed at this time    Patient Education:  Dressing management. S/S infection and to call clinic/ED precautions if any were to occur.   Patient verbalized understanding and agreed with POC.   Home health was not indicated at this time..    The patient will continue to be seen on a regular basis.  Follow up at clinic  in 1 week.    Glenford Peers, DPM  07/03/2023       [1]   Social History  Socioeconomic History    Marital status: Married   Tobacco Use    Smoking status: Heavy Smoker     Current packs/day: 1.00     Types: Cigarettes    Smokeless tobacco: Current   Substance and Sexual Activity    Alcohol use: Yes     Comment: occasionally     Drug use: Yes     Comment: marijuana     Social Determinants of Health     Food Insecurity: Unknown (11/07/2022)    Hunger Vital Sign     Worried About Programme researcher, broadcasting/film/video in the Last Year: Never true    Received from [employer], [facility]    Social Network    Received from [employer], [facility]    HITS   [2] No Known Allergies  [3]   Current Outpatient Medications:     albuterol sulfate HFA (PROVENTIL) 108 (90 Base) MCG/ACT inhaler, Inhale 2 puffs into the lungs every 4 (four) hours as needed for Wheezing or Shortness of Breath (coughing) Dispense with spacer, Disp: 6.7 g, Rfl: 0    amlodipine-benazepril (LOTREL)  10-20 MG per capsule, Take 1 capsule by mouth daily, Disp: , Rfl:     benzonatate (TESSALON) 100 MG capsule, Take 1 capsule (100 mg) by mouth 3 (three) times daily as needed for Cough, Disp: 15 capsule, Rfl: 0    cloNIDine (CATAPRES) 0.3 MG tablet, Take 1 tablet (0.3 mg) by mouth 2 (two) times daily, Disp: , Rfl:     hydrochlorothiazide (HYDRODIURIL) 25 MG tablet, Take 1 tablet (25 mg) by mouth daily, Disp: , Rfl:     omeprazole (PRILOSEC) 40 MG capsule, Take 1 capsule (40 mg) by mouth daily, Disp: , Rfl:

## 2023-07-17 ENCOUNTER — Ambulatory Visit: Payer: Medicare PPO | Attending: Foot & Ankle Surgery | Admitting: Foot & Ankle Surgery

## 2023-07-17 VITALS — BP 185/93 | Temp 96.8°F

## 2023-07-17 DIAGNOSIS — L97822 Non-pressure chronic ulcer of other part of left lower leg with fat layer exposed: Secondary | ICD-10-CM | POA: Insufficient documentation

## 2023-07-17 DIAGNOSIS — L97222 Non-pressure chronic ulcer of left calf with fat layer exposed: Secondary | ICD-10-CM | POA: Insufficient documentation

## 2023-07-17 DIAGNOSIS — I10 Essential (primary) hypertension: Secondary | ICD-10-CM | POA: Insufficient documentation

## 2023-07-17 DIAGNOSIS — R600 Localized edema: Secondary | ICD-10-CM

## 2023-07-17 DIAGNOSIS — I890 Lymphedema, not elsewhere classified: Secondary | ICD-10-CM | POA: Insufficient documentation

## 2023-07-17 DIAGNOSIS — F17210 Nicotine dependence, cigarettes, uncomplicated: Secondary | ICD-10-CM | POA: Insufficient documentation

## 2023-07-17 NOTE — Progress Notes (Signed)
 Ambulatory [facility]    Wound Follow-Up - Provider Documentation    PATIENT: NEVILLE WALSTON DOB: [birth year]   MR #: [id number]  AGE: 57 y.o.    REFERRING MD:  Glenford Peers, DPM PRIMARY MD: [name], MD        [facility]

## 2023-07-17 NOTE — Progress Notes (Signed)
 Ambulatory [facility]  [facility]:  [address] [place], [place] [address]  T [phone]  F [phone]  Wound Follow-up - Nursing Documentation    PATIENT: [name] DOB: [birth year]   MR #: [id number]  AGE: 57 y.

## 2023-07-31 ENCOUNTER — Ambulatory Visit: Payer: Medicare PPO | Attending: Foot & Ankle Surgery | Admitting: Foot & Ankle Surgery

## 2023-07-31 VITALS — BP 170/89 | HR 84 | Temp 97.6°F | Resp 13 | Ht 76.0 in | Wt >= 6400 oz

## 2023-07-31 DIAGNOSIS — R600 Localized edema: Secondary | ICD-10-CM | POA: Insufficient documentation

## 2023-07-31 DIAGNOSIS — I10 Essential (primary) hypertension: Secondary | ICD-10-CM | POA: Insufficient documentation

## 2023-07-31 DIAGNOSIS — F17210 Nicotine dependence, cigarettes, uncomplicated: Secondary | ICD-10-CM | POA: Insufficient documentation

## 2023-07-31 DIAGNOSIS — L97219 Non-pressure chronic ulcer of right calf with unspecified severity: Secondary | ICD-10-CM

## 2023-07-31 DIAGNOSIS — I890 Lymphedema, not elsewhere classified: Secondary | ICD-10-CM | POA: Insufficient documentation

## 2023-07-31 DIAGNOSIS — L97222 Non-pressure chronic ulcer of left calf with fat layer exposed: Secondary | ICD-10-CM | POA: Insufficient documentation

## 2023-07-31 NOTE — Progress Notes (Signed)
 Ambulatory [facility]    Wound Follow-Up - Provider Documentation    PATIENT: [name] DOB: [birth year]   MR #: [id number]  AGE: 57 y.o.    REFERRING MD:  Glenford Peers, DPM PRIMARY MD: [name], MD        [facility]

## 2023-07-31 NOTE — Progress Notes (Signed)
 Ambulatory [facility]  [facility]:  [address] [place], [place] [address]  T [phone]  F [phone]  Wound Follow-up - Nursing Documentation    PATIENT: [name] DOB: [birth year]   MR #: [id number]  AGE: 57 y.

## 2023-08-14 ENCOUNTER — Ambulatory Visit: Payer: Medicare PPO | Attending: Foot & Ankle Surgery | Admitting: Foot & Ankle Surgery

## 2023-08-14 VITALS — BP 151/88 | HR 73 | Temp 97.8°F | Resp 12 | Ht 76.0 in | Wt >= 6400 oz

## 2023-08-14 DIAGNOSIS — I10 Essential (primary) hypertension: Secondary | ICD-10-CM | POA: Insufficient documentation

## 2023-08-14 DIAGNOSIS — I890 Lymphedema, not elsewhere classified: Secondary | ICD-10-CM | POA: Insufficient documentation

## 2023-08-14 DIAGNOSIS — L97219 Non-pressure chronic ulcer of right calf with unspecified severity: Secondary | ICD-10-CM

## 2023-08-14 DIAGNOSIS — L97222 Non-pressure chronic ulcer of left calf with fat layer exposed: Secondary | ICD-10-CM | POA: Insufficient documentation

## 2023-08-14 DIAGNOSIS — Z6841 Body Mass Index (BMI) 40.0 and over, adult: Secondary | ICD-10-CM | POA: Insufficient documentation

## 2023-08-14 DIAGNOSIS — F17210 Nicotine dependence, cigarettes, uncomplicated: Secondary | ICD-10-CM | POA: Insufficient documentation

## 2023-08-14 DIAGNOSIS — R600 Localized edema: Secondary | ICD-10-CM

## 2023-08-14 NOTE — Progress Notes (Addendum)
Ambulatory [facility]  [facility]:  [address] [place], [place] [address]  T [phone]  F [phone]  Wound Follow-up - Nursing Documentation    PATIENT: [name] DOB: 17-Jan-1966   MR #: [id number]  AGE: 57 y.o.    REFERRING MD:  Glenford Peers, DPM PRIMARY MD: [name], MD      Chief Complaint   Patient presents with    Edema    Chronic Ulcer        Assessment & Plan     Case Management:   Pt arrived via wheelchair. Pt name and dob verified. RN reconciled meds and allergies. Pt continued to have wraps placed 2 weeks ago, reports itching started yesterday. Denies pain. Addressed with patient that BP continues to be high, he plans to see provider soon. No date at this time. Pt also reports he will be going to podiatrist soon for toe nail clippings as they're beginning to touch other toes. Pt expressed verbal understanding.    Treatment:  In clinic: LLE: Dakins soak 10 min, remove, calcium alginate, sorbex, vaseline+zinc, xeroform, coban 2  RLE: Vaseline+zinc, xeroform, coban 2    LLE: Dakin's soak , gentamicin, zetuvit 10x20 (2x), Lidex to ankle, Urea+Hydraguard to intact skin, Coflex Zinc compression Wrap  RLE: Urea+Hydraguard to intact skin, Coflex Zinc compression Wrap  Frequency: changes in clinic      Additional Wound Procedures    Performed by: Don Broach, RN  Authorized by: Glenford Peers, DPM    Associated wounds:   Wound 10/31/20 Edema/Edema Management Lower Leg Anterior;Left  Wound 10/31/20 Edema/Edema Management Lower Leg Anterior;Right  Body area:  Lower extremity  Lower extremity location:  R lower leg and L lower leg    Compression wrap type: multi-layer    Wrap location: bilateral    Wrap location equal debrided location?: yes    Debrided location: left    Multi-layer: coban 2    Multi-layer comment:  Today coban 2 but usually zinc coflex   Procedure was performed in a clean field. The leg was washed with soap and water and then patted dry.  Moisturizer was applied to the leg. A multi-layer compression bandage was applied per manufacturer's instructions. Patient tolerated procedure well and without complaints of pain or discomfort. Patient was educated regarding signs and symptoms of overcompression, including unrelieved pain, toes turning red or purple, or numbness in foot.  Patient was instructed to manually remove outer layer (without scissors) and contact clinic immediately.       Patient Education:  Elevate legs for 30 mins, 3x/day, above the level of the heart. If wraps get wet/damaged, or if they're too tight, or if s/he has any questions: please call the [facility]. We are open M-F, 8:30-4:30, closed on weekends. If patient needs care during our off hours, please don't hesitate and go to Urgent Care or Emergency Room. Provided Pt with education regarding compressing wraps, and to never use scissors to remove wraps if too tight. Patient verbalized understanding.      **Supplies ordered through [employer] for zetuvit and coflex zinc tlc**    RTC: 2 wks  Don Broach, RN   08/14/2023

## 2023-08-14 NOTE — Progress Notes (Signed)
[facility]    Wound Follow-Up - Provider Documentation    PATIENT: [name] DOB: 1966/02/08   MR #: [id number]  AGE: 57 y.o.    REFERRING MD:  Glenford Peers, DPM PRIMARY MD: [name], MD        Chief Complaint   Patient presents with    Edema    Chronic Ulcer      The patient was seen in the office today and a follow up evaluation was performed, including a history & focused exam.     [name] is a 58 year old black male who presents today with Patient reports he has had recurrent wounds and swelling of both lower legs for the past two years.  He was being treated at a wound center in [place] but moved to [place] in July 2023.  He has been self treating with gauze/coban dressings and took a prescription for penicillin in January 2024.  PMH significant for morbid obesity, current tobacco user, hypertention, GERD and chronic back pain. Negative for diabetes.   The patient states his chief complaint is "wounds of bilateral lower legs."    History of Present Illness   [name] is a 57 y.o. male who presents to [facility] wound healing center bilateral lower extremity lymphedema, edema and chronic left leg ulcers.  Patient denies fever, chills, nausea, vomiting, and leg pain.     Patient's right leg ulcer is healed however patient still have open left leg ulcer.  Patient has bilateral lower extremity lymphedema/leg edema.    Patient has been treated with Dakin's 10 minutes soaking, gentamicin, Zetuvit.  He is under multilayer compression for his leg for his leg swelling control.    Past Medical History     Past Medical History:   Diagnosis Date    GERD (gastroesophageal reflux disease)     Hypertension     Lower back pain        Past Surgical History   History reviewed. No pertinent surgical history.    Family History   No family history on file.    Social History   Social History[1]    Allergies   Allergies[2]    Medications    Current Medications[3]    Physical Exam     Vitals:    08/14/23 1113   BP: 151/88   Pulse: 73   Resp: 12   Temp: 97.8 F (36.6 C)   SpO2: 98%         Body mass index is 54.78 kg/m.    General:  Patient appears their stated age, well-nourished.  Alert and in no apparent distress.  Lungs: Respiratory effort unlabored, no signs of distress  Vascular: normal capillary refill  Skin: see focused wound care exam  Neuro: AAOx3 normal speech and appropriate affect, see wound care exam for sensory exam    Focused Wound Care Exam   Wound Assessment:   Wound 10/31/22 Lower Leg;Calf Left;Lateral (Active)   Date First Assessed: 10/31/22   Location: Lower Leg;Calf  Wound Location Orientation: Left;Lateral  Present on Original Admission: Yes      Assessments 08/14/2023 11:42 AM   Wound Image     Wound Base Description Full Thickness;Fibrin/slough;Hypergranular;Granulation tissue   Peri-wound Description Hyperpigmented;Dry;Peeling/Flaking   Wound Length (cm) 10 cm   Wound Width (cm) 5 cm   Wound Depth (cm) 0.1 cm   Wound Surface Area (cm^2) 50 cm^2   Wound Volume (cm^3)  5 cm^3   Closure Open   Drainage Amount Moderate   Drainage Description Serosanguinous;Tan/Brown;Yellow   Tunneling No   Undermining  No   Margins Defined edges   Treatments Wound Cleanser       Inactive Orders   Date Order Priority Status Authorizing Provider   07/03/23 1140 DEBRIDEMENT Lower Leg;Calf Left;Lateral Routine Completed Glenford Peers, DPM   06/19/23 1209 DEBRIDEMENT Lower Leg;Calf Left;Lateral Routine Completed Glenford Peers, DPM   06/05/23 1205 DEBRIDEMENT Lower Leg;Calf Left;Lateral Routine Completed Glenford Peers, DPM   05/22/23 1229 DEBRIDEMENT Lower Leg;Calf Left;Lateral Routine Completed Glenford Peers, DPM   05/08/23 1202 DEBRIDEMENT Lower Leg;Calf Left;Lateral Routine Completed Glenford Peers, DPM       Wound 04/10/23 Lower Leg;Calf Left;Posterior;Medial (Active)   Date First Assessed: 04/10/23   Location: Lower Leg;Calf  Wound Location  Orientation: Left;Posterior;Medial  Wound Description: scatter  Present on Original Admission: Yes      Assessments 08/14/2023 11:45 AM   Wound Image     Wound Base Description Granulation tissue;Fibrin/slough;Full Thickness   Wound Length (cm) 8.5 cm   Wound Width (cm) 9 cm   Wound Depth (cm) 0.1 cm   Wound Surface Area (cm^2) 76.5 cm^2   Wound Volume (cm^3) 7.65 cm^3   Closure Open   Drainage Amount Moderate   Drainage Description Serosanguinous;Tan/Brown;Yellow   Tunneling No   Undermining  No   Margins Defined edges       Inactive Orders   Date Order Priority Status Authorizing Provider   05/22/23 1231 DEBRIDEMENT Lower Leg;Calf Left;Posterior;Medial scatter Routine Completed Glenford Peers, DPM       Wound 10/31/20 Edema/Edema Management Lower Leg Anterior;Left (Active)   Date First Assessed/Time First Assessed: 10/31/20 0800   Primary Wound Type: Edema/Edema Management  Edema extremity: LLE  Edema Swelling: Pitting  Location: Lower Leg  Wound Location Orientation: Anterior;Left      Assessments 08/14/2023 11:41 AM   Wound Image     Treatments Cleansed       Inactive Orders   Date Order Priority Status Authorizing Provider   06/05/23 1215 Additional Wound Procedures Routine Completed Glenford Peers, [facility]   05/22/23 1221 Additional Wound Procedures Routine Completed Glenford Peers, DPM   05/08/23 1230 Additional Wound Procedures Routine Completed Glenford Peers, DPM       Wound 10/31/20 Edema/Edema Management Lower Leg Anterior;Right (Active)   Date First Assessed/Time First Assessed: 10/31/20 0800   Primary Wound Type: Edema/Edema Management  Edema extremity: RLE  Edema Swelling: Pitting  Location: Lower Leg  Wound Location Orientation: Anterior;Right      Assessments 08/14/2023 11:40 AM   Wound Image     Treatments Cleansed       Inactive Orders   Date Order Priority Status Authorizing Provider   06/05/23 1215 Additional Wound Procedures Routine Completed Glenford Peers, DPM   05/22/23 1221 Additional Wound  Procedures Routine Completed Glenford Peers, DPM   05/08/23 1230 Additional Wound Procedures Routine Completed Glenford Peers, DPM       Wound 07/17/23 Lower Leg Anterior;Left (Active)   Date First Assessed/Time First Assessed: 07/17/23 1134   Location: Lower Leg  Wound Location Orientation: Anterior;Left      Assessments 08/14/2023 11:42 AM   Wound Image     Wound Base Description Scattered;Scab   Wound Length (cm) 0 cm   Wound Width (cm) 0 cm   Wound Depth (cm) 0 cm   Wound Surface Area (cm^2) 0 cm^2   Wound Volume (cm^3) 0 cm^3   Closure  Approximated, closed and dry       No associated orders.         Edema  R Foot Measurements(cm): 27 cm  R Ankle Measurements(cm): 27 cm  R Calf Measurements(cm): 53 cm  L Foot Measurements(cm): 27 cm  L Ankle Measurements(cm): 27 cm  L Calf Measurements(cm): 51 cm        Labs and Imaging Reviewed   I have reviewed pertinent labs, imaging and cultures.    Procedure Note   Procedure was indicated today.    DEBRIDEMENT Lower Leg;Calf Left;Lateral    Performed by: Glenford Peers, DPM  Authorized by: Glenford Peers, DPM    Associated wounds:   Wound 10/31/22 Lower Leg;Calf Left;Lateral    Consent:     Consent obtained:  Verbal and written    Consent given by:  Patient    Risks discussed: Yes      Time out: Immediately prior to the procedure a time out was called    Time out performed at:  08/14/2023 12:00 PM    Debridement Details:     Performed by:  Physician    Type: excisional      Level: subcutaneous tissue      Pain control:  Lidocaine 2%    Pain control administration: topical        Time taken:  08/14/2023 11:42 AM  Pre-debridement measurements    Length (cm):  10    Width (cm):  5    Depth (cm):  0.1    Surface Area (cm^2):  50    Time taken:  08/14/2023 11:43 AM    Post-debridement measurements    Length (cm):  10    Width (cm):  5    Depth (cm):  0.3    Percent Debrided (%):  100    Surface Area (cm^2):  50    Area Debrided (cm^2):  50    Volume (cm^3):  15    Devitalized  tissue debrided: biofilm, fibrin and necrotic debris      Instrument:  Curette and scalpel #10    Amount of bleeding: small    Specimen sent for culture:   Specimen sent for culture comment:  No  Specimen sent for pathology:   Specimen sent for pathology comment:  No  Culture obtained:  no  Culture obtained comment:  Culture not obtained.    Hemostasis obtained with:  Pressure    Procedural pain:  0    Post-procedural pain:  0    Response to treatment:  Procedure was tolerated well   The wound bed is comprised of 25-50% beefy red and friable granulation tissue and >50% slough/fibrin and necrotic tissue nonviable tissue.         Assessment   This is a 57 y.o. male presents with left leg ulcer and chronic leg edema, lymphedema.    I examined the patient for an ulcer that has been resistant to healing despite numerous interventions. The plan is to now examine any underlying characteristics that may impede healing. I decided that the wounds are not self limited and significantly impact the patient's daily living.    The wounds are improving.    The patient's vascular status was assessed and it was determined that their vascular status is adequate for healing.     I reviewed the leg, ankle and foot circumference measurements to assess the patient's response to compression.  The patient has edema to Bilateral lower extremities. The edema is stable.  Non-healing wounds are a symptom of their significant comorbid diseases. edema and lymphedema    Plan   After evaluation of the patient and ulcer/wound characteristics, I decided that debridement was not indicated today.     Leg ulcers are stable.  Leg edema is stable.  Patient still have moderate amount of drainage from scattered area secondary to lymph leak.  Dakin's 10 minutes soaking, Zetuvit dressing applied    Wound Care Plan:  Advanced Wound dressings appropriate to the needs of the wound have been initiated to provide a microenvironment which can enhance healing  including maintaining a moist environment, decreasing bioburden and insulating and protecting the wound.   The treatment plan will change Dakin's 10 minutes soaking, gentamicin, Zetuvit applied as stated in nursing notes.     Edema Management:    Edema control is imperative for healing.  Patient instructed to elevate the lower extremities above the level of the heart when possible.  Active compression will be used to gain control of the patient's edema. Edema managed through use of zinc impregnated Coban 2.     Nutrition:    Nutrition status is adequate. I made suggestions regarding appropriate protein intake, vitamin supplementation, and adequate hydration. Recommendations include healthy diet    Coordination of Care: I recommended none needed at this time    Patient Education:  Dressing management. S/S infection and to call clinic/ED precautions if any were to occur.   Patient verbalized understanding and agreed with POC.   Home health was not indicated at this time..    The patient will continue to be seen on a regular basis.  Follow up at clinic  in 1 week.    Glenford Peers, DPM  08/14/2023         [1]   Social History  Socioeconomic History    Marital status: Married   Tobacco Use    Smoking status: Heavy Smoker     Current packs/day: 1.00     Types: Cigarettes    Smokeless tobacco: Current   Substance and Sexual Activity    Alcohol use: Yes     Comment: occasionally     Drug use: Yes     Comment: marijuana     Social Drivers of Health     Food Insecurity: Unknown (11/07/2022)    Hunger Vital Sign     Worried About Programme researcher, broadcasting/film/video in the Last Year: Never true    Received from [employer], [facility]    Social Network    Received from [employer], [facility]    HITS   [2] No Known Allergies  [3]   Current Outpatient Medications:     amlodipine-benazepril (LOTREL) 10-20 MG per capsule, Take 1 capsule by mouth daily, Disp: , Rfl:     benzonatate (TESSALON) 100 MG capsule, Take 1 capsule (100 mg) by  mouth 3 (three) times daily as needed for Cough, Disp: 15 capsule, Rfl: 0    cloNIDine (CATAPRES) 0.3 MG tablet, Take 1 tablet (0.3 mg) by mouth 2 (two) times daily, Disp: , Rfl:     hydrochlorothiazide (HYDRODIURIL) 25 MG tablet, Take 1 tablet (25 mg) by mouth daily, Disp: , Rfl:     omeprazole (PRILOSEC) 40 MG capsule, Take 1 capsule (40 mg) by mouth daily, Disp: , Rfl:     albuterol sulfate HFA (PROVENTIL) 108 (90 Base) MCG/ACT inhaler, Inhale 2 puffs into the lungs every 4 (four) hours as needed for Wheezing or Shortness of Breath (coughing)  Dispense with spacer, Disp: 6.7 g, Rfl: 0

## 2023-08-28 ENCOUNTER — Ambulatory Visit: Payer: Medicare PPO

## 2023-09-04 ENCOUNTER — Ambulatory Visit: Payer: Medicare PPO

## 2023-09-09 ENCOUNTER — Ambulatory Visit: Payer: Medicare PPO

## 2023-12-18 ENCOUNTER — Ambulatory Visit

## 2023-12-25 ENCOUNTER — Ambulatory Visit: Attending: Foot & Ankle Surgery | Admitting: Foot & Ankle Surgery

## 2023-12-25 VITALS — BP 144/73 | HR 61 | Temp 97.5°F | Resp 18 | Wt >= 6400 oz

## 2023-12-25 DIAGNOSIS — L97219 Non-pressure chronic ulcer of right calf with unspecified severity: Secondary | ICD-10-CM

## 2023-12-25 DIAGNOSIS — L97222 Non-pressure chronic ulcer of left calf with fat layer exposed: Secondary | ICD-10-CM | POA: Insufficient documentation

## 2023-12-25 DIAGNOSIS — I10 Essential (primary) hypertension: Secondary | ICD-10-CM | POA: Insufficient documentation

## 2023-12-25 DIAGNOSIS — I890 Lymphedema, not elsewhere classified: Secondary | ICD-10-CM | POA: Insufficient documentation

## 2023-12-25 DIAGNOSIS — R600 Localized edema: Secondary | ICD-10-CM | POA: Insufficient documentation

## 2023-12-25 NOTE — Progress Notes (Signed)
 [facility]    Wound Follow-Up - Provider Documentation    PATIENT: [name] DOB: [birth year]   MR #: [id number]  AGE: 58 y.o.    REFERRING MD:  Referring, Not On File,* PRIMARY MD: Luberta Ruse, MD        Chief Complaint   Patient presents with    Wound Check      The patient was seen in the office today and a follow up evaluation was performed, including a history & focused exam.     [name] is a 58 year old black male who presents today with Patient reports he has had recurrent wounds and swelling of both lower legs for the past two years.  He was being treated at a wound center in [place] but moved to [place]  in July 2023.  He has been self treating with gauze/coban dressings and took a prescription for penicillin in January 2024.  PMH significant for morbid obesity, current tobacco user, hypertention, GERD and chronic back pain. Negative for diabetes.   The patient states his chief complaint is "wounds of bilateral lower legs."    History of Present Illness   [name] is a 58 y.o. male who presents to [facility] wound healing center bilateral lower extremity lymphedema, edema and chronic left leg ulcers.  Patient denies fever, chills, nausea, vomiting, and leg pain.     Patient's right leg ulcer is healed however patient still have open left leg ulcer.  Patient has bilateral lower extremity lymphedema/leg edema.    Patient has been treated with Dakin's 10 minutes soaking, gentamicin, Zetuvit.  He is under multilayer compression for his leg for his leg swelling control.    Interval HPI 12/25/2023: Patient was lost in follow-up since November of last year.  Patient lost his transportation and was not able to come into wound center for follow-up until today.  Patient has been using zinc Coflex at home to control his leg swelling until he ran out of his supply.  Patient has been using Kerlix and Coban to manage his left leg ulcer  and swelling.    Past Medical History     Past Medical History:   Diagnosis Date    GERD (gastroesophageal reflux disease)     Hypertension     Lower back pain        Past Surgical History   No past surgical history on file.    Family History   No family history on file.    Social History   Social History[1]    Allergies   Allergies[2]    Medications   Current Medications[3]    Physical Exam     Vitals:    12/25/23 1116   BP: 144/73   Pulse: 61   Resp: 18   Temp: 97.5 F (36.4 C)       Body mass index is 54.78 kg/m.    General:  Patient appears their stated age, well-nourished.  Alert and in no apparent distress.  Lungs: Respiratory effort unlabored, no signs of distress  Vascular: normal capillary refill  Skin: see focused wound care exam  Neuro: AAOx3 normal speech and appropriate affect, see wound care exam for sensory exam    Focused Wound Care Exam   Wound Assessment:   Wound 10/31/22 Lower Leg;Calf Left;Lateral (Active)   Date First Assessed: 10/31/22   Location: Lower Leg;Calf  Wound Location Orientation: Left;Lateral  Wound Description: lateral/posterior  Present  on Original Admission: Yes      Assessments 12/25/2023 11:25 AM 12/25/2023 11:26 AM   Wound Image      Wound Base Description Moist;Red;Scattered --   Peri-wound Description Hyperpigmented;Maceration --   Wound Length (cm) 13 cm 13 cm   Wound Width (cm) 14 cm 14 cm   Wound Depth (cm) 0.1 cm 0.3 cm   Wound Surface Area (cm^2) 142.94 cm^2 142.94 cm^2   Wound Volume (cm^3) 9.529 cm^3 28.588 cm^3   Closure Open --   Drainage Amount Large --   Drainage Description Serous --   Tunneling No --   Undermining  No --   Margins Defined edges --   Treatments Cleansed --   Compression Wrap 1 Coflex TLC x/Zinc --       Active Orders   Date Order Priority Status Authorizing Provider   12/25/23 1206 DEBRIDEMENT Lower Leg;Calf Left;Lateral Routine Active Ivana Maris, DPM       Inactive Orders   Date Order Priority Status Authorizing Provider   08/14/23 1200  DEBRIDEMENT Lower Leg;Calf Left;Lateral Routine Completed Ivana Maris, DPM   07/03/23 1140 DEBRIDEMENT Lower Leg;Calf Left;Lateral Routine Completed Ivana Maris, DPM   06/19/23 1209 DEBRIDEMENT Lower Leg;Calf Left;Lateral Routine Completed Ivana Maris, DPM   06/05/23 1205 DEBRIDEMENT Lower Leg;Calf Left;Lateral Routine Completed Ivana Maris, DPM   05/22/23 1229 DEBRIDEMENT Lower Leg;Calf Left;Lateral Routine Completed Ivana Maris, DPM   05/08/23 1202 DEBRIDEMENT Lower Leg;Calf Left;Lateral Routine Completed Ivana Maris, DPM       Wound 10/31/20 Edema/Edema Management Lower Leg Anterior;Left (Active)   Date First Assessed/Time First Assessed: 10/31/20 0800   Primary Wound Type: Edema/Edema Management  Edema extremity: LLE  Edema Swelling: Pitting  Location: Lower Leg  Wound Location Orientation: Anterior;Left      Assessments 12/25/2023 11:25 AM   Wound Image     Treatments Cleansed   Compression Wrap 1 Coflex TLC x/Zinc       Inactive Orders   Date Order Priority Status Authorizing Provider   08/14/23 1203 Additional Wound Procedures Routine Completed Ivana Maris, DPM   06/05/23 1215 Additional Wound Procedures Routine Completed Ivana Maris, DPM   05/22/23 1221 Additional Wound Procedures Routine Completed Ivana Maris, DPM   05/08/23 1230 Additional Wound Procedures Routine Completed Ivana Maris, DPM       Wound 10/31/20 Edema/Edema Management Lower Leg Anterior;Right (Active)   Date First Assessed/Time First Assessed: 10/31/20 0800   Primary Wound Type: Edema/Edema Management  Edema extremity: RLE  Edema Swelling: Pitting  Location: Lower Leg  Wound Location Orientation: Anterior;Right      Assessments 12/25/2023 11:25 AM   Wound Image     Treatments Cleansed   Compression Wrap 1 Coban 2 or equivalent       Inactive Orders   Date Order Priority Status Authorizing Provider   08/14/23 1203 Additional Wound Procedures Routine Completed Ivana Maris, DPM   06/05/23 1215 Additional Wound  Procedures Routine Completed Ivana Maris, DPM   05/22/23 1221 Additional Wound Procedures Routine Completed Ivana Maris, DPM   05/08/23 1230 Additional Wound Procedures Routine Completed Ivana Maris, DPM     Edema  RLE Edema: Brawny; 2+ indentation < 5mm  R Foot Measurements(cm): 27 cm  R Ankle Measurements(cm): 28 cm  R Calf Measurements(cm): 54 cm  LLE Edema: Brawny; 2+ indentation < 5mm  L Foot Measurements(cm): 27 cm  L Ankle Measurements(cm): 27 cm  L Calf Measurements(cm): 54 cm      Procedure Note   I decided  to perform debridement today because, based on the patient's current status, there is an expectation that serial debridement will improve healing potential, reduce or control tissue infection, and prepare the tissue for surgical management. Surgical debridement is required to excise a specific, targeted area of devitalized or necrotic tissue along the margin of viable tissue by sharp dissection. I have reviewed the individualized treatment plan for this patient who requires a debridement. Debridement is needed as part of the plan to control heavy wound colonization. I previously assessed the patient's vascular status which is adequate. I have ensured that this ulcer has been adequately off-loaded. I am continuing to address nutritional issues, monitoring weights, encouraging the use of supplements and drawing laboratory tests when appropriate.    DEBRIDEMENT Lower Leg;Calf Left;Lateral    Performed by: Ivana Maris, DPM  Authorized by: Ivana Maris, DPM  Associated wounds:   Wound 10/31/22 Lower Leg;Calf Left;Lateral      Consent:     Consent obtained:  Verbal and written    Consent given by:  Patient    Risks discussed: Yes      Time out: Immediately prior to the procedure a time out was called    Time out performed at:  12/25/2023 12:06 PM    Debridement Details:     Performed by:  Physician    Type: excisional      Level: subcutaneous tissue      Pain control:  Lidocaine 2%    Pain control  administration: topical        Time taken:  12/25/2023 11:25 AM  Pre-debridement measurements    Length (cm):  13    Width (cm):  14    Depth (cm):  0.1    Surface Area (cm^2):  182    Time taken:  12/25/2023 11:26 AM    Post-debridement measurements    Length (cm):  13    Width (cm):  14    Depth (cm):  0.3    Percent Debrided (%):  100    Surface Area (cm^2):  182    Area Debrided (cm^2):  182    Volume (cm^3):  54.6    Devitalized tissue debrided: biofilm, fibrin and necrotic debris      Instrument:  Curette and scalpel #10    Amount of bleeding: small    Specimen sent for culture: yes  Specimen sent for culture comment:  Yes  Specimen sent for pathology:   Specimen sent for pathology comment:  No  Culture obtained:  yes  Culture obtained comment:  Culture obtained.    Hemostasis obtained with:  Pressure    Procedural pain:  0    Post-procedural pain:  0    Response to treatment:  Procedure was tolerated well   The wound bed is comprised of 0% granulation tissue and 100% slough/fibrin and necrotic tissue nonviable tissue.           Assessment & Plan   I examined the patient for an wound(s) that has been resistant to healing despite numerous interventions. The plan is to now examine any underlying characteristics that may impede healing. I decided that the wounds are not self limited and significantly impact the patient's daily living.    Wound Assessment and Plan:  The wounds are assessed as followed: stable.    Wound cultures obtained today and sent to the lab.    After evaluation of the patient and ulcer/wound characteristics, I decided that debridement was indicated today.  Wound Care Plan:  Advanced Wound dressings appropriate to the needs of the wound have been initiated to provide a microenvironment which can enhance healing including maintaining a moist environment, decreasing bioburden and insulating and protecting the wound.     The treatment plan will  Vashe 5-minute soaking, Aquacel applied   as stated  in nursing notes.   An antimicrobial dressing was applied as the primary dressing for control of possible excess bioburden : silver alginate    Vascular Status:   The patient's vascular status was assessed and it was determined that their vascular status is adequate for healing.     Edema Management:    The patient has edema to Bilateral lower extremities.  I reviewed the leg, ankle and foot circumference measurements to assess the patient's response to compression. The edema is  +3 pitting edema . Edema control is imperative for healing.  Patient instructed to elevate the lower extremities above the level of the heart when possible.  Active compression will be used to gain control of the patient's edema. Edema managed through use of Coban 2. I described the possible complications compression wraps and told the patient to remove them if they felt too tight or became soiled, but otherwise they will leave the wraps in place until the next visit..     Nutrition:    Nutrition status is adequate.  I made suggestions regarding appropriate protein intake, vitamin supplementation, and adequate hydration. Recommendations include healthy diet    Other comorbidities that may affect wound healing: Non-healing wounds are a symptom of their significant comorbid diseases. edema and lymphedema    Coordination of Care: I recommended none needed at this time    Patient Education:  Dressing management. S/S infection and to call clinic/ED precautions if any were to occur.   Patient verbalized understanding and agreed with POC.   Home health was not indicated at this time..    The patient will continue to be seen on a regular basis.  Follow up in clinic in 1 week and PRN interval nurse visits for dressing changes.    Ivana Maris, DPM  12/25/2023          [1]   Social History  Socioeconomic History    Marital status: Married   Tobacco Use    Smoking status: Heavy Smoker     Current packs/day: 1.00     Types: Cigarettes    Smokeless  tobacco: Current   Substance and Sexual Activity    Alcohol use: Yes     Comment: occasionally     Drug use: Yes     Comment: marijuana     Social Drivers of Health     Food Insecurity: Unknown (11/07/2022)    Hunger Vital Sign     Worried About Running Out of Food in the Last Year: Never true    Received from [employer]    Social Network    Received from [facility]    HITS   [2] No Known Allergies  [3]   Current Outpatient Medications:     albuterol  sulfate HFA (PROVENTIL ) 108 (90 Base) MCG/ACT inhaler, Inhale 2 puffs into the lungs every 4 (four) hours as needed for Wheezing or Shortness of Breath (coughing) Dispense with spacer, Disp: 6.7 g, Rfl: 0    cloNIDine (CATAPRES) 0.3 MG tablet, Take 1 tablet (0.3 mg) by mouth 2 (two) times daily, Disp: , Rfl:     omeprazole (PRILOSEC) 40 MG capsule, Take 1 capsule (40  mg) by mouth daily, Disp: , Rfl:     amlodipine-benazepril (LOTREL) 10-20 MG per capsule, Take 1 capsule by mouth daily (Patient not taking: Reported on 12/25/2023), Disp: , Rfl:     benzonatate  (TESSALON ) 100 MG capsule, Take 1 capsule (100 mg) by mouth 3 (three) times daily as needed for Cough (Patient not taking: Reported on 12/25/2023), Disp: 15 capsule, Rfl: 0    hydrochlorothiazide (HYDRODIURIL) 25 MG tablet, Take 1 tablet (25 mg) by mouth daily (Patient not taking: Reported on 12/25/2023), Disp: , Rfl:

## 2023-12-25 NOTE — Progress Notes (Signed)
 [facility]  [facility]:  [address], [place], [place] [address]  T [phone]  F [phone]  Wound Follow-up - Nursing Documentation    PATIENT: [name] DOB: May 08, 1966   MR #: [id number]  AGE: 58 y.o.    REFERRING MD:  Referring, Not On File,* PRIMARY MD: Luberta Ruse, MD      Chief Complaint   Patient presents with   . Wound Check        Assessment & Plan     Case Management:  RTC: one week.    Wound culture obtained LLE.    Patient identification verified using two identifiers.  This patient has been determined to be at risk for a fall, extra precautionary measures have been taken to ensure their safety.      Treatment:  LLE: Dakin's soak , aquacel AG, sorbex, Coflex Zinc compression Wrap.    RLE: vasoline and Hydraguard to intact skin, coban 2.    Additional Wound Procedures    Performed by: Raenette Bumps, RN  Authorized by: Ivana Maris, DPM    Body area:  Lower extremity  Lower extremity location:  R lower leg and L lower leg    Compression wrap type: multi-layer    Wrap location: bilateral    Wrap location equal debrided location?: yes    Debrided location: left    Multi-layer: other     Procedure was performed in a clean field. The leg was washed with soap and water and then patted dry. Moisturizer was applied to the leg. A multi-layer compression bandage was applied per manufacturer's instructions. Patient tolerated procedure well and without complaints of pain or discomfort. Patient was educated regarding signs and symptoms of overcompression, including unrelieved pain, toes turning red or purple, or numbness in foot.  Patient was instructed to manually remove outer layer (without scissors) and contact clinic immediately.      Patient Education:  Elevate legs for 30 mins, 3x/day, above the level of the heart. "Above the level of the heart," is key here. The easiest way to achieve this is to lay down flat and prop your legs up with at  least 2 pillows.    Patient and/or Caregiver verbalized understanding of dressing changes/plan of care.    Raenette Bumps, RN   12/25/2023

## 2023-12-28 LAB — CULTURE AND GRAM STAIN, AEROBIC BACTERIA, WOUND/TISSUE/FLUID: Gram Stain: NONE SEEN — AB

## 2023-12-30 ENCOUNTER — Telehealth: Payer: Self-pay | Admitting: Physician Assistant

## 2023-12-30 ENCOUNTER — Ambulatory Visit: Payer: Self-pay | Admitting: Physician Assistant

## 2023-12-30 MED ORDER — LEVOFLOXACIN 500 MG PO TABS
500.0000 mg | ORAL_TABLET | Freq: Every day | ORAL | 0 refills | Status: AC
Start: 2023-12-30 — End: 2024-01-09

## 2023-12-30 NOTE — Telephone Encounter (Signed)
 I reviewed the patient's most recent culture results of their left calf wound, which were as follows     Susceptibility data from last 90 days.  Collected Specimen Info Organism Amoxicillin /Clavulanic acid Ampicillin Ampicillin/Sulbactam Aztreonam Cefazolin Cefepime Cefoxitin Ceftazidime Ceftriaxone  Cefuroxime Ciprofloxacin CLINDAMYCIN Ertapenem Erythromycin    12/25/23 Swab from Calf, Left Proteus mirabilis  S  R  I  S  R  S  S  S  S  S  S   S      Staphylococcus aureus             R   R     Mixed cutaneous flora                   Collected Specimen Info Organism Gentamicin Levofloxacin  Oxacillin Piperacillin/Tazobactam Rifampin Sodium Tetracycline Trimethoprim  + Sulfamethoxazole    12/25/23 Swab from Calf, Left Proteus mirabilis  S  S   S    R  R     Staphylococcus aureus  S  S  S   S  S  S  S     Mixed cutaneous flora                CrCl cannot be calculated (Patient's most recent lab result is older than the maximum 30 days allowed.).    Dr. Danae Duncans recommends the following interventions: Antibiotic prescription for Levofloxacin  500mg  Daily x 10 days    I discussed this with the Patient and they agreed with the plan.   The patient will be seen at their next follow up appointment on 01/01/24 for further evaluation.

## 2024-01-01 ENCOUNTER — Ambulatory Visit: Admitting: Foot & Ankle Surgery

## 2024-01-01 VITALS — BP 140/79 | HR 62 | Temp 97.6°F

## 2024-01-01 DIAGNOSIS — F17210 Nicotine dependence, cigarettes, uncomplicated: Secondary | ICD-10-CM | POA: Insufficient documentation

## 2024-01-01 DIAGNOSIS — B964 Proteus (mirabilis) (morganii) as the cause of diseases classified elsewhere: Secondary | ICD-10-CM | POA: Insufficient documentation

## 2024-01-01 DIAGNOSIS — X58XXXA Exposure to other specified factors, initial encounter: Secondary | ICD-10-CM | POA: Insufficient documentation

## 2024-01-01 DIAGNOSIS — S81801A Unspecified open wound, right lower leg, initial encounter: Secondary | ICD-10-CM | POA: Insufficient documentation

## 2024-01-01 DIAGNOSIS — R600 Localized edema: Secondary | ICD-10-CM

## 2024-01-01 DIAGNOSIS — I890 Lymphedema, not elsewhere classified: Secondary | ICD-10-CM | POA: Insufficient documentation

## 2024-01-01 DIAGNOSIS — L97222 Non-pressure chronic ulcer of left calf with fat layer exposed: Secondary | ICD-10-CM | POA: Insufficient documentation

## 2024-01-01 DIAGNOSIS — B9561 Methicillin susceptible Staphylococcus aureus infection as the cause of diseases classified elsewhere: Secondary | ICD-10-CM | POA: Insufficient documentation

## 2024-01-01 NOTE — Progress Notes (Signed)
 Ambulatory [facility]    Wound Follow-Up - Provider Documentation    PATIENT: [name] DOB: [birth year]   MR #: [id number]  AGE: 58 y.o.    REFERRING MD:  Luberta Ruse, MD PRIMARY MD: Luberta Ruse, MD        Chief Complaint   Patient presents with    Edema    Wound Check      The patient was seen in the office today and a follow up evaluation was performed, including a history & focused exam.     [name] is a 58 year old black male who presents today with Patient reports he has had recurrent wounds and swelling of both lower legs for the past two years.  He was being treated at a wound center in [place] but moved to [place]  in July 2023.  He has been self treating with gauze/coban dressings and took a prescription for penicillin in January 2024.  PMH significant for morbid obesity, current tobacco user, hypertention, GERD and chronic back pain. Negative for diabetes.   The patient states his chief complaint is "wounds of bilateral lower legs."    History of Present Illness   [name] is a 58 y.o. male who presents to [facility] wound healing center bilateral lower extremity lymphedema, edema and chronic left leg ulcers.  Patient denies fever, chills, nausea, vomiting, and leg pain.     Patient's right leg ulcer is healed however patient still have open left leg ulcer.  Patient has bilateral lower extremity lymphedema/leg edema.    Patient has been treated with Dakin's 10 minutes soaking, gentamicin, Zetuvit.  He is under multilayer compression for his leg for his leg swelling control.    Interval HPI 12/25/2023: Patient was lost in follow-up since November of last year.  Patient lost his transportation and was not able to come into wound center for follow-up until today.  Patient has been using zinc Coflex at home to control his leg swelling until he ran out of his supply.  Patient has been using Kerlix and Coban to manage his left leg  ulcer and swelling.    Interval HPI 01/01/2024: Patient was treated with Dakin's 10 minutes soaking, Aquacel Ag applied to his leg ulcer.  Patient was placed under Coflex compression on left and Coban to compression on right last week.  Patient stated that he received a phone call regarding antibiotic therapy per his leg wound infection however he has not started the medication yet and he will start today.    Past Medical History     Past Medical History:   Diagnosis Date    GERD (gastroesophageal reflux disease)     Hypertension     Lower back pain        Past Surgical History   No past surgical history on file.    Family History   No family history on file.    Social History   Social History[1]    Allergies   Allergies[2]    Medications   Current Medications[3]    Physical Exam     Vitals:    01/01/24 1125   BP: 140/79   Pulse: 62   Temp: 97.6 F (36.4 C)       There is no height or weight on file to calculate BMI.    General:  Patient appears their stated age, well-nourished.  Alert and in no apparent distress.  Lungs: Respiratory effort  unlabored, no signs of distress  Vascular: normal capillary refill  Skin: see focused wound care exam  Neuro: AAOx3 normal speech and appropriate affect, see wound care exam for sensory exam    Focused Wound Care Exam   Wound Assessment:   Wound 10/31/22 Lower Leg;Calf Left;Lateral (Active)   Date First Assessed: 10/31/22   Location: Lower Leg;Calf  Wound Location Orientation: Left;Lateral  Wound Description: lateral/posterior  Present on Original Admission: Yes      Assessments 01/01/2024 11:41 AM   Wound Image     Wound Base Description Pink;Moist   Peri-wound Description Hyperpigmented   Wound Length (cm) 14.1 cm   Wound Width (cm) 13.2 cm   Wound Depth (cm) 0.1 cm   Wound Surface Area (cm^2) 146.18 cm^2   Wound Volume (cm^3) 9.745 cm^3   Closure Open       Inactive Orders   Date Order Priority Status Authorizing Provider   12/25/23 1206 DEBRIDEMENT Lower Leg;Calf Left;Lateral  Routine Completed Ivana Maris, DPM   08/14/23 1200 DEBRIDEMENT Lower Leg;Calf Left;Lateral Routine Completed Ivana Maris, DPM   07/03/23 1140 DEBRIDEMENT Lower Leg;Calf Left;Lateral Routine Completed Ivana Maris, DPM   06/19/23 1209 DEBRIDEMENT Lower Leg;Calf Left;Lateral Routine Completed Ivana Maris, DPM   06/05/23 1205 DEBRIDEMENT Lower Leg;Calf Left;Lateral Routine Completed Ivana Maris, DPM   05/22/23 1229 DEBRIDEMENT Lower Leg;Calf Left;Lateral Routine Completed Ivana Maris, DPM   05/08/23 1202 DEBRIDEMENT Lower Leg;Calf Left;Lateral Routine Completed Ivana Maris, DPM       Wound 10/31/20 Edema/Edema Management Lower Leg Anterior;Left (Active)   Date First Assessed/Time First Assessed: 10/31/20 0800   Primary Wound Type: Edema/Edema Management  Edema extremity: LLE  Edema Swelling: Pitting  Location: Lower Leg  Wound Location Orientation: Anterior;Left      Assessments 01/01/2024 11:41 AM   Wound Image     Treatments Cleansed;Compression Wrap1   Compression Wrap 1 Coflex TLC x/Zinc       Inactive Orders   Date Order Priority Status Authorizing Provider   08/14/23 1203 Additional Wound Procedures Routine Completed Ivana Maris, [facility]   06/05/23 1215 Additional Wound Procedures Routine Completed Ivana Maris, DPM   05/22/23 1221 Additional Wound Procedures Routine Completed Ivana Maris, DPM   05/08/23 1230 Additional Wound Procedures Routine Completed Ivana Maris, DPM       Wound 10/31/20 Edema/Edema Management Lower Leg Anterior;Right (Active)   Date First Assessed/Time First Assessed: 10/31/20 0800   Primary Wound Type: Edema/Edema Management  Edema extremity: RLE  Edema Swelling: Pitting  Location: Lower Leg  Wound Location Orientation: Anterior;Right      Assessments 01/01/2024 11:40 AM   Wound Image     Treatments Cleansed;Compression Wrap1   Compression Wrap 1 Coflex TLC x/Zinc       Inactive Orders   Date Order Priority Status Authorizing Provider   08/14/23 1203 Additional  Wound Procedures Routine Completed Ivana Maris, [facility]   06/05/23 1215 Additional Wound Procedures Routine Completed Ivana Maris, DPM   05/22/23 1221 Additional Wound Procedures Routine Completed Ivana Maris, DPM   05/08/23 1230 Additional Wound Procedures Routine Completed Ivana Maris, DPM       Wound 01/01/24 Lower Leg Anterior;Right (Active)   Date First Assessed/Time First Assessed: 01/01/24 1137   Location: Lower Leg  Wound Location Orientation: Anterior;Right      Assessments 01/01/2024 11:40 AM   Wound Image     Wound Base Description Scab;Scattered;Dry   Peri-wound Description Dry;Intact   Wound Length (cm) 5.6 cm   Wound Width (cm) 13.4  cm   Wound Depth (cm) 0.1 cm   Wound Surface Area (cm^2) 58.94 cm^2   Wound Volume (cm^3) 3.929 cm^3   Closure Open   Drainage Amount None   Margins Undefined edges   Treatments Cleansed       No associated orders.     Edema  RLE Edema: Pitting; 2+ indentation < 5mm  R Foot Measurements(cm): 27 cm  R Ankle Measurements(cm): 27 cm  R Calf Measurements(cm): 57 cm  LLE Edema: Pitting; 2+ indentation < 5mm  L Foot Measurements(cm): 27 cm  L Ankle Measurements(cm): 27.5 cm  L Calf Measurements(cm): 53 cm      Procedure Note   I decided to perform debridement today because, based on the patient's current status, there is an expectation that serial debridement will improve healing potential, reduce or control tissue infection, and prepare the tissue for surgical management. Surgical debridement is required to excise a specific, targeted area of devitalized or necrotic tissue along the margin of viable tissue by sharp dissection. I have reviewed the individualized treatment plan for this patient who requires a debridement. Debridement is needed as part of the plan to control heavy wound colonization. I previously assessed the patient's vascular status which is adequate. I have ensured that this ulcer has been adequately off-loaded. I am continuing to address nutritional  issues, monitoring weights, encouraging the use of supplements and drawing laboratory tests when appropriate.    DEBRIDEMENT Lower Leg;Calf Left;Lateral lateral/posterior    Performed by: Ivana Maris, DPM  Authorized by: Ivana Maris, DPM  Associated wounds:   Wound 10/31/22 Lower Leg;Calf Left;Lateral      Consent:     Consent obtained:  Verbal and written    Consent given by:  Patient    Risks discussed: Yes      Time out: Immediately prior to the procedure a time out was called    Time out performed at:  01/01/2024 11:57 AM    Debridement Details:     Performed by:  Physician    Type: excisional      Level: subcutaneous tissue      Pain control:  Lidocaine 2%    Pain control administration: topical        Time taken:  01/01/2024 11:41 AM  Pre-debridement measurements    Length (cm):  14.1    Width (cm):  13.2    Depth (cm):  0.1    Surface Area (cm^2):  146.18    Time taken:  01/01/2024 11:42 AM    Post-debridement measurements    Length (cm):  14.1    Width (cm):  13.2    Depth (cm):  0.3    Percent Debrided (%):  100    Surface Area (cm^2):  186.12    Area Debrided (cm^2):  186.12    Volume (cm^3):  55.84    Devitalized tissue debrided: biofilm, fibrin and necrotic debris      Instrument:  Curette and scalpel #10    Amount of bleeding: small    Specimen sent for culture:   Specimen sent for culture comment:  No  Specimen sent for pathology:   Specimen sent for pathology comment:  No  Culture obtained:  no  Culture obtained comment:  Culture not obtained.    Hemostasis obtained with:  Pressure    Procedural pain:  0    Post-procedural pain:  0    Response to treatment:  Procedure was tolerated well   The wound bed is comprised  of 0% granulation tissue and 100% slough/fibrin and necrotic tissue nonviable tissue.         Assessment & Plan   I examined the patient for an wound(s) that has been resistant to healing despite numerous interventions. The plan is to now examine any underlying characteristics that may  impede healing. I decided that the wounds are not self limited and significantly impact the patient's daily living.    Wound Assessment and Plan:  The wounds are assessed as followed: stable.    Wound culture result reviewed-moderate growth of Proteus Mirabella's, light growth of Staphylococcus aureus.  Patient's wound culture was reviewed earlier and Rx Levaquin  500 daily was called in on Monday but patient has just picked up medicine this morning, he will start taking medicine starting today.    After evaluation of the patient and ulcer/wound characteristics, I decided that debridement was indicated today.    Wound Care Plan:  Advanced Wound dressings appropriate to the needs of the wound have been initiated to provide a microenvironment which can enhance healing including maintaining a moist environment, decreasing bioburden and insulating and protecting the wound.     Patient was treated with Dakin's 10 minutes soaking, gentamicin, Aquacel Ag applied.  Patient was placed under the multilayer compressions to control his leg swelling.  Coflex zinc compression applied.    Vascular Status:   The patient's vascular status was assessed and it was determined that their vascular status is adequate for healing.     Edema Management:    The patient has edema to Bilateral lower extremities.  I reviewed the leg, ankle and foot circumference measurements to assess the patient's response to compression. The edema is  +3 pitting edema . Edema control is imperative for healing.  Patient instructed to elevate the lower extremities above the level of the heart when possible.  Active compression will be used to gain control of the patient's edema. Edema managed through use of Coban 2. I described the possible complications compression wraps and told the patient to remove them if they felt too tight or became soiled, but otherwise they will leave the wraps in place until the next visit..     Nutrition:    Nutrition status is  adequate.  I made suggestions regarding appropriate protein intake, vitamin supplementation, and adequate hydration. Recommendations include healthy diet    Other comorbidities that may affect wound healing: Non-healing wounds are a symptom of their significant comorbid diseases. edema and lymphedema    Coordination of Care: I recommended none needed at this time    Patient Education:  Dressing management. S/S infection and to call clinic/ED precautions if any were to occur.   Patient verbalized understanding and agreed with POC.   Home health was not indicated at this time..    The patient will continue to be seen on a regular basis.  Follow up in clinic in 1 week and PRN interval nurse visits for dressing changes.    Ivana Maris, DPM  01/01/2024          [1]   Social History  Socioeconomic History    Marital status: Married   Tobacco Use    Smoking status: Heavy Smoker     Current packs/day: 1.00     Types: Cigarettes    Smokeless tobacco: Current   Substance and Sexual Activity    Alcohol use: Yes     Comment: occasionally     Drug use: Yes     Comment: marijuana  Social Drivers of Health     Food Insecurity: Unknown (11/07/2022)    Hunger Vital Sign     Worried About Running Out of Food in the Last Year: Never true    Received from [employer]    Social Network    Received from [employer]    HITS   [2] No Known Allergies  [3]   Current Outpatient Medications:     carvedilol (COREG) 12.5 MG tablet, Take 1 tablet (12.5 mg) by mouth nightly, Disp: , Rfl:     carvedilol (COREG) 25 MG tablet, Take 1 tablet (25 mg) by mouth daily In the morning, Disp: , Rfl:     furosemide (LASIX) 40 MG tablet, Take 1 tablet (40 mg) by mouth 2 (two) times daily, Disp: , Rfl:     albuterol  sulfate HFA (PROVENTIL ) 108 (90 Base) MCG/ACT inhaler, Inhale 2 puffs into the lungs every 4 (four) hours as needed for Wheezing or Shortness of Breath (coughing) Dispense with spacer, Disp: 6.7 g, Rfl: 0    amlodipine-benazepril (LOTREL)  10-20 MG per capsule, Take 1 capsule by mouth daily, Disp: , Rfl:     benzonatate  (TESSALON ) 100 MG capsule, Take 1 capsule (100 mg) by mouth 3 (three) times daily as needed for Cough, Disp: 15 capsule, Rfl: 0    cloNIDine (CATAPRES) 0.3 MG tablet, Take 1 tablet (0.3 mg) by mouth 2 (two) times daily, Disp: , Rfl:     hydrochlorothiazide (HYDRODIURIL) 25 MG tablet, Take 1 tablet (25 mg) by mouth daily, Disp: , Rfl:     levoFLOXacin  (LEVAQUIN ) 500 MG tablet, Take 1 tablet (500 mg) by mouth daily for 10 days, Disp: 10 tablet, Rfl: 0    omeprazole (PRILOSEC) 40 MG capsule, Take 1 capsule (40 mg) by mouth daily, Disp: , Rfl:

## 2024-01-01 NOTE — Progress Notes (Signed)
 Ambulatory [facility]  [facility]:  [address] [place], [place] [address]  T [phone]  F [phone]  Wound Follow-up - Nursing Documentation    PATIENT: [name] DOB: October 27, 1965   MR #: [id number]  AGE: 58 y.o.    REFERRING MD:  Luberta Ruse, MD PRIMARY MD: Luberta Ruse, MD      Chief Complaint   Patient presents with    Edema    Wound Check         Assessment & Plan     Case Management:  Patient arrived in the clinic on a wheel chair. Denies pain, VS stable. RN reconciled meds and allergies.    RTC: one week.    Patient identification verified using two identifiers.  This patient has been determined to be at risk for a fall, extra precautionary measures have been taken to ensure their safety.      Treatment:  LLE: Dakin's soak , gentamicin, Aquacel AG, sorbex, Coflex Zinc compression Wrap.  RLE: Zinc Coflex    Additional Wound Procedures    Performed by: Auburn Hert IV V, RN  Authorized by: Ivana Maris, DPM  Associated wounds:   Wound 10/31/22 Lower Leg;Calf Left;Lateral  Wound 10/31/20 Edema/Edema Management Lower Leg Anterior;Left  Wound 10/31/20 Edema/Edema Management Lower Leg Anterior;Right  Wound 01/01/24 Lower Leg Anterior;Right    Body area:  Lower extremity  Lower extremity location:  R lower leg and L lower leg    Compression wrap type: multi-layer    Wrap location: left    Wrap location equal debrided location?: yes    Debrided location: left    Multi-layer: 2 layer     Procedure was performed in a clean field. The leg was washed with soap and water and then patted dry. Moisturizer was applied to the leg. A multi-layer compression bandage was applied per manufacturer's instructions. Patient tolerated procedure well and without complaints of pain or discomfort. Patient was educated regarding signs and symptoms of overcompression, including unrelieved pain, toes turning red or purple, or numbness in foot.  Patient was instructed to  manually remove outer layer (without scissors) and contact clinic immediately.       Patient Education:  Elevate legs for 30 mins, 3x/day, above the level of the heart. "Above the level of the heart," is key here. The easiest way to achieve this is to lay down flat and prop your legs up with at least 2 pillows.    Patient and/or Caregiver verbalized understanding of dressing changes/plan of care.    Bradely Rudin IV Marybelle Smiling, RN   01/01/2024

## 2024-01-08 ENCOUNTER — Ambulatory Visit: Admitting: Foot & Ankle Surgery

## 2024-01-08 VITALS — BP 120/64 | HR 70 | Temp 97.7°F

## 2024-01-08 DIAGNOSIS — B9561 Methicillin susceptible Staphylococcus aureus infection as the cause of diseases classified elsewhere: Secondary | ICD-10-CM | POA: Insufficient documentation

## 2024-01-08 DIAGNOSIS — I890 Lymphedema, not elsewhere classified: Secondary | ICD-10-CM | POA: Insufficient documentation

## 2024-01-08 DIAGNOSIS — L97219 Non-pressure chronic ulcer of right calf with unspecified severity: Secondary | ICD-10-CM

## 2024-01-08 DIAGNOSIS — S81801A Unspecified open wound, right lower leg, initial encounter: Secondary | ICD-10-CM | POA: Insufficient documentation

## 2024-01-08 DIAGNOSIS — F17210 Nicotine dependence, cigarettes, uncomplicated: Secondary | ICD-10-CM | POA: Insufficient documentation

## 2024-01-08 DIAGNOSIS — L97222 Non-pressure chronic ulcer of left calf with fat layer exposed: Secondary | ICD-10-CM | POA: Insufficient documentation

## 2024-01-08 DIAGNOSIS — X58XXXA Exposure to other specified factors, initial encounter: Secondary | ICD-10-CM | POA: Insufficient documentation

## 2024-01-08 DIAGNOSIS — B964 Proteus (mirabilis) (morganii) as the cause of diseases classified elsewhere: Secondary | ICD-10-CM | POA: Insufficient documentation

## 2024-01-08 DIAGNOSIS — R600 Localized edema: Secondary | ICD-10-CM

## 2024-01-08 NOTE — Progress Notes (Signed)
 Ambulatory [facility]  [facility]:  [address] [place], [place] [address]  T [phone]  F [phone]  Wound Follow-up - Nursing Documentation    PATIENT: [name] DOB: 05-28-1966   MR #: [id number]  AGE: 58 y.o.    REFERRING MD:  Luberta Ruse, MD PRIMARY MD: Luberta Ruse, MD      Chief Complaint   Patient presents with    Wound Check    Edema          Assessment & Plan     Case Management:  Patient arrived in the clinic on a wheel chair. Denies pain, VS stable. RN reconciled meds and allergies. Patient reported he only started taking     RTC: one week.    Patient identification verified using two identifiers.  This patient has been determined to be at risk for a fall, extra precautionary measures have been taken to ensure their safety.      Treatment:  LLE: Dakin's soak , gentamicin, Aquacel AG, sorbex, Coflex Zinc compression Wrap.  RLE: xeroform on lateral area,Zinc Coflex    Additional Wound Procedures    Performed by: Curley Hogen IV V, RN  Authorized by: [name], DPM  Associated wounds:   Wound 10/31/22 Lower Leg;Calf Left;Posterior  Wound 10/31/20 Edema/Edema Management Lower Leg Anterior;Left  Wound 10/31/20 Edema/Edema Management Lower Leg Anterior;Right  Wound 01/01/24 Lower Leg Anterior;Right    Body area:  Lower extremity  Lower extremity location:  R lower leg and L lower leg    Compression wrap type: multi-layer    Wrap location: bilateral    Wrap location equal debrided location?: yes    Debrided location: left    Multi-layer: 2 layer     Procedure was performed in a clean field. The leg was washed with soap and water and then patted dry. Moisturizer was applied to the leg. A multi-layer compression bandage was applied per manufacturer's instructions. Patient tolerated procedure well and without complaints of pain or discomfort. Patient was educated regarding signs and symptoms of overcompression, including unrelieved pain, toes  turning red or purple, or numbness in foot.  Patient was instructed to manually remove outer layer (without scissors) and contact clinic immediately.       Patient Education:  Elevate legs for 30 mins, 3x/day, above the level of the heart. "Above the level of the heart," is key here. The easiest way to achieve this is to lay down flat and prop your legs up with at least 2 pillows.    Patient and/or Caregiver verbalized understanding of dressing changes/plan of care.    Jazmyn Offner IV Marybelle Smiling, RN   01/08/2024

## 2024-01-08 NOTE — Progress Notes (Signed)
 [facility]    Wound Follow-Up - Provider Documentation    PATIENT: [name] DOB: [birth year]   MR #: [id number]  AGE: 58 y.o.    REFERRING MD:  Luberta Ruse, MD PRIMARY MD: Luberta Ruse, MD        Chief Complaint   Patient presents with    Wound Check    Edema      The patient was seen in the office today and a follow up evaluation was performed, including a history & focused exam.     [name] is a 59 year old black male who presents today with Patient reports he has had recurrent wounds and swelling of both lower legs for the past two years.  He was being treated at a wound center in [place] but moved to [place]  in July 2023.  He has been self treating with gauze/coban dressings and took a prescription for penicillin in January 2024.  PMH significant for morbid obesity, current tobacco user, hypertention, GERD and chronic back pain. Negative for diabetes.   The patient states his chief complaint is "wounds of bilateral lower legs."    History of Present Illness   [name] is a 58 y.o. male who presents to [facility] wound healing center bilateral lower extremity lymphedema, edema and chronic left leg ulcers.  Patient denies fever, chills, nausea, vomiting, and leg pain.     Patient's right leg ulcer is healed however patient still have open left leg ulcer.  Patient has bilateral lower extremity lymphedema/leg edema.    Patient has been treated with Dakin's 10 minutes soaking, gentamicin, Zetuvit.  He is under multilayer compression for his leg for his leg swelling control.    Interval HPI 12/25/2023: Patient was lost in follow-up since November of last year.  Patient lost his transportation and was not able to come into wound center for follow-up until today.  Patient has been using zinc Coflex at home to control his leg swelling until he ran out of his supply.  Patient has been using Kerlix and Coban to manage his left leg  ulcer and swelling.    Interval HPI 01/01/2024: Patient was treated with Dakin's 10 minutes soaking, Aquacel Ag applied to his leg ulcer.  Patient was placed under Coflex compression on left and Coban to compression on right last week.  Patient stated that he received a phone call regarding antibiotic therapy per his leg wound infection however he has not started the medication yet and he will start today.    Interval HPI 01/08/2024: Patient was treated with Dakin's 10 minutes soaking, gentamicin, Aquacel Ag to his leg ulcer.  Patient is under Coflex compression to control his leg swelling.  Patient has been taking Levaquin  for last 2 days.  Patient is still taking Levaquin .    Past Medical History     Past Medical History:   Diagnosis Date    GERD (gastroesophageal reflux disease)     Hypertension     Lower back pain        Past Surgical History   No past surgical history on file.    Family History   No family history on file.    Social History   Social History[1]    Allergies   Allergies[2]    Medications   Current Medications[3]    Physical Exam     Vitals:    01/08/24 1229   BP: 166/76  Pulse: 78   Temp: 97.6 F (36.4 C)         There is no height or weight on file to calculate BMI.    General:  Patient appears their stated age, well-nourished.  Alert and in no apparent distress.  Lungs: Respiratory effort unlabored, no signs of distress  Vascular: normal capillary refill  Skin: see focused wound care exam  Neuro: AAOx3 normal speech and appropriate affect, see wound care exam for sensory exam    Focused Wound Care Exam   Wound Assessment:   Wound 10/31/22 Lower Leg;Calf Left;Posterior (Active)   Date First Assessed: 10/31/22   Location: Lower Leg;Calf  Wound Location Orientation: Left;Posterior  Wound Description: lateral/posterior  Present on Original Admission: Yes      Assessments 01/08/2024 11:56 AM 01/08/2024 11:57 AM   Wound Image       Wound Base Description Partial Thickness;Red;Scattered --   Peri-wound  Description Hyperpigmented --   Wound Length (cm) 19.1 cm 19.1 cm   Wound Width (cm) 19.8 cm 19.8 cm   Wound Depth (cm) 0.1 cm 0.3 cm   Wound Surface Area (cm^2) 297.02 cm^2 297.02 cm^2   Wound Volume (cm^3) 19.801 cm^3 59.404 cm^3   Closure Open --   Drainage Amount Small --   Drainage Description Serous --   Margins Undefined edges --   Treatments Cleansed;Compression Wrap1 --   Compression Wrap 1 Coflex TLC x/Zinc --       Active Orders   Date Order Priority Status Authorizing Provider   01/08/24 1232 Additional Wound Procedures Routine Active Ivana Maris, DPM   01/08/24 1208 DEBRIDEMENT Lower Leg;Calf Left;Posterior lateral/posterior Routine Active Ivana Maris, DPM       Inactive Orders   Date Order Priority Status Authorizing Provider   01/01/24 1157 Additional Wound Procedures Routine Completed Ivana Maris, [facility]   01/01/24 1157 DEBRIDEMENT Lower Leg;Calf Left;Lateral lateral/posterior Routine Completed Ivana Maris, DPM   12/25/23 1206 DEBRIDEMENT Lower Leg;Calf Left;Lateral Routine Completed Ivana Maris, DPM   08/14/23 1200 DEBRIDEMENT Lower Leg;Calf Left;Lateral Routine Completed Ivana Maris, DPM   07/03/23 1140 DEBRIDEMENT Lower Leg;Calf Left;Lateral Routine Completed Ivana Maris, DPM   06/19/23 1209 DEBRIDEMENT Lower Leg;Calf Left;Lateral Routine Completed Ivana Maris, DPM   06/05/23 1205 DEBRIDEMENT Lower Leg;Calf Left;Lateral Routine Completed Ivana Maris, DPM   05/22/23 1229 DEBRIDEMENT Lower Leg;Calf Left;Lateral Routine Completed Ivana Maris, DPM   05/08/23 1202 DEBRIDEMENT Lower Leg;Calf Left;Lateral Routine Completed Ivana Maris, DPM       Wound 10/31/20 Edema/Edema Management Lower Leg Anterior;Left (Active)   Date First Assessed/Time First Assessed: 10/31/20 0800   Primary Wound Type: Edema/Edema Management  Edema extremity: LLE  Edema Swelling: Pitting  Location: Lower Leg  Wound Location Orientation: Anterior;Left      Assessments 01/08/2024 11:56 AM   Wound Image      Treatments Cleansed;Compression Wrap1   Compression Wrap 1 Coflex TLC x/Zinc       Active Orders   Date Order Priority Status Authorizing Provider   01/08/24 1232 Additional Wound Procedures Routine Active Ivana Maris, DPM       Inactive Orders   Date Order Priority Status Authorizing Provider   01/01/24 1157 Additional Wound Procedures Routine Completed Ivana Maris, [facility]   08/14/23 1203 Additional Wound Procedures Routine Completed Ivana Maris, [facility]   06/05/23 1215 Additional Wound Procedures Routine Completed Ivana Maris, DPM   05/22/23 1221 Additional Wound Procedures Routine Completed Ivana Maris, DPM   05/08/23 1230 Additional Wound Procedures Routine Completed Ivana Maris,  DPM       Wound 10/31/20 Edema/Edema Management Lower Leg Anterior;Right (Active)   Date First Assessed/Time First Assessed: 10/31/20 0800   Primary Wound Type: Edema/Edema Management  Edema extremity: RLE  Edema Swelling: Pitting  Location: Lower Leg  Wound Location Orientation: Anterior;Right      Assessments 01/08/2024 11:54 AM   Wound Image     Treatments Cleansed;Compression Wrap1   Compression Wrap 1 Coflex TLC x/Zinc       Active Orders   Date Order Priority Status Authorizing Provider   01/08/24 1232 Additional Wound Procedures Routine Active Ivana Maris, DPM       Inactive Orders   Date Order Priority Status Authorizing Provider   01/01/24 1157 Additional Wound Procedures Routine Completed Ivana Maris, DPM   08/14/23 1203 Additional Wound Procedures Routine Completed Ivana Maris, DPM   06/05/23 1215 Additional Wound Procedures Routine Completed Ivana Maris, DPM   05/22/23 1221 Additional Wound Procedures Routine Completed Ivana Maris, DPM   05/08/23 1230 Additional Wound Procedures Routine Completed Ivana Maris, DPM       Wound 01/01/24 Lower Leg Anterior;Right (Active)   Date First Assessed/Time First Assessed: 01/01/24 1137   Location: Lower Leg  Wound Location Orientation: Anterior;Right       Assessments 01/08/2024 11:54 AM   Wound Image     Wound Length (cm) 0 cm   Wound Width (cm) 0 cm   Wound Depth (cm) 0 cm   Wound Surface Area (cm^2) 0 cm^2   Wound Volume (cm^3) 0 cm^3   Treatments Cleansed;Compression Wrap1   Compression Wrap 1 Coflex TLC x/Zinc       Active Orders   Date Order Priority Status Authorizing Provider   01/08/24 1232 Additional Wound Procedures Routine Active Ivana Maris, DPM       Inactive Orders   Date Order Priority Status Authorizing Provider   01/01/24 1157 Additional Wound Procedures Routine Completed Ivana Maris, DPM       Edema  RLE Edema: Pitting; 2+ indentation < 5mm  R Foot Measurements(cm): 26.5 cm  R Ankle Measurements(cm): 28 cm  R Calf Measurements(cm): 54 cm  RLE Treatment: RLE Multilayer  RLE Multilayer Tx: Other  LLE Edema: Pitting; 2+ indentation < 5mm  L Foot Measurements(cm): 26.5 cm  L Ankle Measurements(cm): 28 cm  L Calf Measurements(cm): 55 cm  LLE Treatment: LLE Multilayer  LLE Multilayer Tx: Other        Procedure Note   I decided to perform debridement today because, based on the patient's current status, there is an expectation that serial debridement will improve healing potential, reduce or control tissue infection, and prepare the tissue for surgical management. Surgical debridement is required to excise a specific, targeted area of devitalized or necrotic tissue along the margin of viable tissue by sharp dissection. I have reviewed the individualized treatment plan for this patient who requires a debridement. Debridement is needed as part of the plan to control heavy wound colonization. I previously assessed the patient's vascular status which is adequate. I have ensured that this ulcer has been adequately off-loaded. I am continuing to address nutritional issues, monitoring weights, encouraging the use of supplements and drawing laboratory tests when appropriate.    DEBRIDEMENT Lower Leg;Calf Left;Posterior lateral/posterior    Performed by:  Ivana Maris, DPM  Authorized by: Ivana Maris, DPM  Associated wounds:   Wound 10/31/22 Lower Leg;Calf Left;Posterior      Consent:     Consent obtained:  Verbal and written  Consent given by:  Patient    Risks discussed: Yes      Time out: Immediately prior to the procedure a time out was called    Time out performed at:  01/08/2024 12:08 PM    Debridement Details:     Performed by:  Physician    Type: excisional      Level: subcutaneous tissue      Pain control:  Lidocaine 2%    Pain control administration: topical        Time taken:  01/08/2024 11:56 AM  Pre-debridement measurements    Length (cm):  19.1    Width (cm):  19.8    Depth (cm):  0.1    Surface Area (cm^2):  378.18    Time taken:  01/08/2024 11:57 AM    Post-debridement measurements    Length (cm):  19.1    Width (cm):  19.8    Depth (cm):  0.3    Percent Debrided (%):  30    Surface Area (cm^2):  378.18    Area Debrided (cm^2):  113.45    Volume (cm^3):  113.45    Devitalized tissue debrided: biofilm, fibrin and necrotic debris      Instrument:  Curette and scalpel #10    Amount of bleeding: small    Specimen sent for culture:   Specimen sent for culture comment:  No  Specimen sent for pathology:   Specimen sent for pathology comment:  No  Culture obtained:  no  Culture obtained comment:  Culture not obtained.    Hemostasis obtained with:  Pressure    Procedural pain:  0    Post-procedural pain:  0    Response to treatment:  Procedure was tolerated well   The wound bed is comprised of 25-50% beefy red and scattered granulation tissue and >50% necrotic tissue nonviable tissue.         Assessment & Plan   I examined the patient for an wound(s) that has been resistant to healing despite numerous interventions. The plan is to now examine any underlying characteristics that may impede healing. I decided that the wounds are not self limited and significantly impact the patient's daily living.    Wound Assessment and Plan:  The wounds are assessed as  followed: stable.    Wound culture result reviewed-moderate growth of Proteus Mirabella's, light growth of Staphylococcus aureus.  Patient's wound culture was reviewed earlier and Rx Levaquin  500 daily was called in on Monday but patient has just picked up medicine this morning, he will start taking medicine starting today.    After evaluation of the patient and ulcer/wound characteristics, I decided that debridement was indicated today.    Wound Care Plan:  Advanced Wound dressings appropriate to the needs of the wound have been initiated to provide a microenvironment which can enhance healing including maintaining a moist environment, decreasing bioburden and insulating and protecting the wound.     Patient was treated with Dakin's 10 minutes soaking, gentamicin, Aquacel Ag applied.  Patient was placed under the multilayer compressions to control his leg swelling.  Coflex zinc compression applied.    Vascular Status:   The patient's vascular status was assessed and it was determined that their vascular status is adequate for healing.     Edema Management:    The patient has edema to Bilateral lower extremities.  I reviewed the leg, ankle and foot circumference measurements to assess the patient's response to compression. The edema is  +3 pitting  edema . Edema control is imperative for healing.  Patient instructed to elevate the lower extremities above the level of the heart when possible.  Active compression will be used to gain control of the patient's edema. Edema managed through use of Coban 2. I described the possible complications compression wraps and told the patient to remove them if they felt too tight or became soiled, but otherwise they will leave the wraps in place until the next visit..     Nutrition:    Nutrition status is adequate.  I made suggestions regarding appropriate protein intake, vitamin supplementation, and adequate hydration. Recommendations include healthy diet    Other comorbidities  that may affect wound healing: Non-healing wounds are a symptom of their significant comorbid diseases. edema and lymphedema    Coordination of Care: I recommended none needed at this time    Patient Education:  Dressing management. S/S infection and to call clinic/ED precautions if any were to occur.   Patient verbalized understanding and agreed with POC.   Home health was not indicated at this time..    The patient will continue to be seen on a regular basis.  Follow up in clinic in 1 week and PRN interval nurse visits for dressing changes.    Ivana Maris, DPM  01/08/2024          [1]   Social History  Socioeconomic History    Marital status: Married   Tobacco Use    Smoking status: Heavy Smoker     Current packs/day: 1.00     Types: Cigarettes    Smokeless tobacco: Current   Substance and Sexual Activity    Alcohol use: Yes     Comment: occasionally     Drug use: Yes     Comment: marijuana     Social Drivers of Health     Food Insecurity: Unknown (11/07/2022)    Hunger Vital Sign     Worried About Running Out of Food in the Last Year: Never true    Received from [employer]    Social Network    Received from [employer]    HITS   [2] No Known Allergies  [3]   Current Outpatient Medications:     albuterol  sulfate HFA (PROVENTIL ) 108 (90 Base) MCG/ACT inhaler, Inhale 2 puffs into the lungs every 4 (four) hours as needed for Wheezing or Shortness of Breath (coughing) Dispense with spacer, Disp: 6.7 g, Rfl: 0    amlodipine-benazepril (LOTREL) 10-20 MG per capsule, Take 1 capsule by mouth daily, Disp: , Rfl:     benzonatate  (TESSALON ) 100 MG capsule, Take 1 capsule (100 mg) by mouth 3 (three) times daily as needed for Cough, Disp: 15 capsule, Rfl: 0    carvedilol (COREG) 12.5 MG tablet, Take 1 tablet (12.5 mg) by mouth nightly, Disp: , Rfl:     carvedilol (COREG) 25 MG tablet, Take 1 tablet (25 mg) by mouth daily In the morning, Disp: , Rfl:     cloNIDine (CATAPRES) 0.3 MG tablet, Take 1 tablet (0.3 mg) by mouth 2  (two) times daily, Disp: , Rfl:     furosemide (LASIX) 40 MG tablet, Take 1 tablet (40 mg) by mouth 2 (two) times daily, Disp: , Rfl:     hydrochlorothiazide (HYDRODIURIL) 25 MG tablet, Take 1 tablet (25 mg) by mouth daily, Disp: , Rfl:     levoFLOXacin  (LEVAQUIN ) 500 MG tablet, Take 1 tablet (500 mg) by mouth daily for 10 days, Disp: 10 tablet, Rfl:  0    omeprazole (PRILOSEC) 40 MG capsule, Take 1 capsule (40 mg) by mouth daily, Disp: , Rfl:

## 2024-01-15 ENCOUNTER — Ambulatory Visit

## 2024-01-22 ENCOUNTER — Ambulatory Visit: Admitting: Foot & Ankle Surgery

## 2024-01-22 VITALS — BP 141/89 | HR 80 | Temp 98.2°F | Resp 18 | Wt >= 6400 oz

## 2024-01-22 DIAGNOSIS — L97822 Non-pressure chronic ulcer of other part of left lower leg with fat layer exposed: Secondary | ICD-10-CM | POA: Insufficient documentation

## 2024-01-22 DIAGNOSIS — F17290 Nicotine dependence, other tobacco product, uncomplicated: Secondary | ICD-10-CM | POA: Insufficient documentation

## 2024-01-22 DIAGNOSIS — I10 Essential (primary) hypertension: Secondary | ICD-10-CM | POA: Insufficient documentation

## 2024-01-22 DIAGNOSIS — R600 Localized edema: Secondary | ICD-10-CM

## 2024-01-22 DIAGNOSIS — L97222 Non-pressure chronic ulcer of left calf with fat layer exposed: Secondary | ICD-10-CM

## 2024-01-22 DIAGNOSIS — L97212 Non-pressure chronic ulcer of right calf with fat layer exposed: Secondary | ICD-10-CM

## 2024-01-22 DIAGNOSIS — I890 Lymphedema, not elsewhere classified: Secondary | ICD-10-CM | POA: Insufficient documentation

## 2024-01-22 NOTE — Progress Notes (Signed)
 [facility]  [facility]:  [address], [place], [place] [address]  T [phone]  F [phone]  Wound Follow-up - Nursing Documentation    PATIENT: [name] DOB: 06-07-1966   MR #: [id number]  AGE: 58 y.o.    REFERRING MD:  Luberta Ruse, MD PRIMARY MD: Luberta Ruse, MD      Chief Complaint   Patient presents with    Wound Check        Assessment & Plan     Case Management:  Patient identification verified using two identifiers.  This patient has been determined to be at risk for a fall, extra precautionary measures have been taken to ensure their safety.      RTC: one week.    Treatment:  LLE: Dakin's soak , gent aquacel AG, sorbex, Coflex Zinc compression Wrap.  RLE: xeroform on lateral area, Zinc coflex       Additional Wound Procedures    Performed by: Raenette Bumps, RN  Authorized by: Ivana Maris, DPM  Associated wounds:   Wound 10/31/20 Edema/Edema Management Lower Leg Anterior;Left  Wound 10/31/20 Edema/Edema Management Lower Leg Anterior;Right    Body area:  Lower extremity  Lower extremity location:  R lower leg and L lower leg    Compression wrap type: multi-layer    Wrap location: bilateral    Wrap location equal debrided location?: yes    Debrided location: left    Multi-layer: other     Procedure was performed in a clean field. The leg was washed with soap and water and then patted dry. Moisturizer was applied to the leg. A multi-layer compression bandage was applied per manufacturer's instructions. Patient tolerated procedure well and without complaints of pain or discomfort. Patient was educated regarding signs and symptoms of overcompression, including unrelieved pain, toes turning red or purple, or numbness in foot.  Patient was instructed to manually remove outer layer (without scissors) and contact clinic immediately.      Patient Education:  Elevate legs for 30 mins, 3x/day, above the level of the heart. "Above the level of the  heart," is key here. The easiest way to achieve this is to lay down flat and prop your legs up with at least 2 pillows.    Patient and/or Caregiver verbalized understanding of dressing changes/plan of care.    Raenette Bumps, RN   01/22/2024

## 2024-01-22 NOTE — Progress Notes (Signed)
 [facility]    Wound Follow-Up - Provider Documentation    PATIENT: DIEON JAMAL DOB: [birth year]   MR #: [id number]  AGE: 58 y.o.    REFERRING MD:  Luberta Ruse, MD PRIMARY MD: Luberta Ruse, MD        Chief Complaint   Patient presents with    Wound Check      The patient was seen in the office today and a follow up evaluation was performed, including a history & focused exam.     Mr. Thiago Cure is a 58 year old black male who presents today with Patient reports he has had recurrent wounds and swelling of both lower legs for the past two years.  He was being treated at a wound center in [place] but moved to [place]  in July 2023.  He has been self treating with gauze/coban dressings and took a prescription for penicillin in January 2024.  PMH significant for morbid obesity, current tobacco user, hypertention, GERD and chronic back pain. Negative for diabetes.   The patient states his chief complaint is "wounds of bilateral lower legs."    History of Present Illness   SUFIYAN MCGAFFIGAN is a 58 y.o. male who presents to [facility] wound healing center bilateral lower extremity lymphedema, edema and chronic left leg ulcers.  Patient denies fever, chills, nausea, vomiting, and leg pain.     Patient's right leg ulcer is healed however patient still have open left leg ulcer.  Patient has bilateral lower extremity lymphedema/leg edema.    Patient has been treated with Dakin's 10 minutes soaking, gentamicin, Zetuvit.  He is under multilayer compression for his leg for his leg swelling control.    Interval HPI 12/25/2023: Patient was lost in follow-up since November of last year.  Patient lost his transportation and was not able to come into wound center for follow-up until today.  Patient has been using zinc Coflex at home to control his leg swelling until he ran out of his supply.  Patient has been using Kerlix and Coban to manage his left leg ulcer and  swelling.    Interval HPI 01/01/2024: Patient was treated with Dakin's 10 minutes soaking, Aquacel Ag applied to his leg ulcer.  Patient was placed under Coflex compression on left and Coban to compression on right last week.  Patient stated that he received a phone call regarding antibiotic therapy per his leg wound infection however he has not started the medication yet and he will start today.    Interval HPI 01/08/2024: Patient was treated with Dakin's 10 minutes soaking, gentamicin, Aquacel Ag to his leg ulcer.  Patient is under Coflex compression to control his leg swelling.  Patient has been taking Levaquin  for last 2 days.  Patient is still taking Levaquin .    Interval HPI 01/22/2024: Patient was treated with Dakin's 10-minute soaking, gentamicin, Aquacel Ag to his leg ulcers.  Patient on the Coflex compression to control his leg swelling.  Patient has finished taking Levaquin .  Patient missed last week's appointment secondary to patient was unable to find parking at the hospital for over 1 hour.  Patient left due to unable to [facility], and coming to the wound center.    Past Medical History     Past Medical History:   Diagnosis Date    GERD (gastroesophageal reflux disease)     Hypertension     Lower back pain  Past Surgical History   No past surgical history on file.    Family History   No family history on file.    Social History   Social History[1]    Allergies   Allergies[2]    Medications   Current Medications[3]    Physical Exam     Vitals:    01/22/24 1055   BP: 141/89   Pulse: 80   Resp: 18   Temp: 98.2 F (36.8 C)       Body mass index is 54.78 kg/m.    General:  Patient appears their stated age, well-nourished.  Alert and in no apparent distress.  Lungs: Respiratory effort unlabored, no signs of distress  Vascular: normal capillary refill  Skin: see focused wound care exam  Neuro: AAOx3 normal speech and appropriate affect, see wound care exam for sensory exam    Focused Wound Care Exam    Wound Assessment:   Wound 10/31/22 Lower Leg;Calf Left;Posterior (Active)   Date First Assessed: 10/31/22   Location: Lower Leg;Calf  Wound Location Orientation: Left;Posterior  Wound Description: lateral/posterior  Present on Original Admission: Yes      Assessments 01/22/2024 11:06 AM 01/22/2024 11:07 AM   Wound Image      Wound Base Description Fibrin/slough;Full Thickness;Moist --   Peri-wound Description Hyperpigmented;Maceration --   Wound Length (cm) 17.5 cm 17.5 cm   Wound Width (cm) 23 cm 23 cm   Wound Depth (cm) 0.1 cm 0.3 cm   Wound Surface Area (cm^2) 316.12 cm^2 316.12 cm^2   Wound Volume (cm^3) 21.075 cm^3 63.224 cm^3   Closure Open --   Drainage Amount Moderate --   Drainage Description Serosanguinous --   Tunneling No --   Undermining  No --   Margins Defined edges --   Treatments Cleansed --   Compression Wrap 1 Coflex TLC x/Zinc --       Active Orders   Date Order Priority Status Authorizing Provider   01/22/24 1124 DEBRIDEMENT Lower Leg;Calf Left;Posterior lateral/posterior Routine Active Ivana Maris, DPM       Inactive Orders   Date Order Priority Status Authorizing Provider   01/08/24 1232 Additional Wound Procedures Routine Completed Ivana Maris, [facility]   01/08/24 1208 DEBRIDEMENT Lower Leg;Calf Left;Posterior lateral/posterior Routine Completed Ivana Maris, DPM   01/01/24 1157 Additional Wound Procedures Routine Completed Ivana Maris, DPM   01/01/24 1157 DEBRIDEMENT Lower Leg;Calf Left;Lateral lateral/posterior Routine Completed Ivana Maris, DPM   12/25/23 1206 DEBRIDEMENT Lower Leg;Calf Left;Lateral Routine Completed Ivana Maris, DPM   08/14/23 1200 DEBRIDEMENT Lower Leg;Calf Left;Lateral Routine Completed Ivana Maris, DPM   07/03/23 1140 DEBRIDEMENT Lower Leg;Calf Left;Lateral Routine Completed Ivana Maris, DPM   06/19/23 1209 DEBRIDEMENT Lower Leg;Calf Left;Lateral Routine Completed Ivana Maris, DPM   06/05/23 1205 DEBRIDEMENT Lower Leg;Calf Left;Lateral Routine  Completed Ivana Maris, DPM   05/22/23 1229 DEBRIDEMENT Lower Leg;Calf Left;Lateral Routine Completed Ivana Maris, DPM   05/08/23 1202 DEBRIDEMENT Lower Leg;Calf Left;Lateral Routine Completed Ivana Maris, DPM       Wound 10/31/20 Edema/Edema Management Lower Leg Anterior;Left (Active)   Date First Assessed/Time First Assessed: 10/31/20 0800   Primary Wound Type: Edema/Edema Management  Edema extremity: LLE  Edema Swelling: Pitting  Location: Lower Leg  Wound Location Orientation: Anterior;Left      Assessments 01/22/2024 11:06 AM   Wound Image     Treatments Cleansed   Compression Wrap 1 Coflex TLC x/Zinc       Active Orders   Date Order Priority Status Authorizing Provider  01/22/24 1154 Additional Wound Procedures Routine Active Ivana Maris, DPM       Inactive Orders   Date Order Priority Status Authorizing Provider   01/08/24 1232 Additional Wound Procedures Routine Completed Ivana Maris, [facility]   01/01/24 1157 Additional Wound Procedures Routine Completed Ivana Maris, DPM   08/14/23 1203 Additional Wound Procedures Routine Completed Ivana Maris, DPM   06/05/23 1215 Additional Wound Procedures Routine Completed Ivana Maris, DPM   05/22/23 1221 Additional Wound Procedures Routine Completed Ivana Maris, DPM   05/08/23 1230 Additional Wound Procedures Routine Completed Ivana Maris, DPM       Wound 10/31/20 Edema/Edema Management Lower Leg Anterior;Right (Active)   Date First Assessed/Time First Assessed: 10/31/20 0800   Primary Wound Type: Edema/Edema Management  Edema extremity: RLE  Edema Swelling: Pitting  Location: Lower Leg  Wound Location Orientation: Anterior;Right      Assessments 01/22/2024 11:06 AM   Wound Image     Treatments Cleansed   Compression Wrap 1 Coflex TLC x/Zinc       Active Orders   Date Order Priority Status Authorizing Provider   01/22/24 1154 Additional Wound Procedures Routine Active Ivana Maris, DPM       Inactive Orders   Date Order Priority Status Authorizing  Provider   01/08/24 1232 Additional Wound Procedures Routine Completed Ivana Maris, DPM   01/01/24 1157 Additional Wound Procedures Routine Completed Ivana Maris, DPM   08/14/23 1203 Additional Wound Procedures Routine Completed Ivana Maris, DPM   06/05/23 1215 Additional Wound Procedures Routine Completed Ivana Maris, DPM   05/22/23 1221 Additional Wound Procedures Routine Completed Ivana Maris, DPM   05/08/23 1230 Additional Wound Procedures Routine Completed Ivana Maris, DPM       Wound 01/01/24 Lower Leg Anterior;Right (Active)   Date First Assessed/Time First Assessed: 01/01/24 1137   Location: Lower Leg  Wound Location Orientation: Anterior;Right      Assessments 01/22/2024 11:07 AM   Wound Image     Wound Base Description Dry   Peri-wound Description Hyperpigmented   Wound Length (cm) 0 cm   Wound Width (cm) 0 cm   Wound Depth (cm) 0 cm   Wound Surface Area (cm^2) 0 cm^2   Wound Volume (cm^3) 0 cm^3   Closure Approximated, closed and dry   Drainage Amount None   Tunneling No   Undermining  No   Treatments Cleansed   Compression Wrap 1 Coflex TLC x/Zinc       Inactive Orders   Date Order Priority Status Authorizing Provider   01/08/24 1232 Additional Wound Procedures Routine Completed Ivana Maris, DPM   01/01/24 1157 Additional Wound Procedures Routine Completed Ivana Maris, DPM     Edema  R Foot Measurements(cm): 27 cm  R Ankle Measurements(cm): 28 cm  R Calf Measurements(cm): 52 cm  L Foot Measurements(cm): 27.5 cm  L Ankle Measurements(cm): 27.5 cm  L Calf Measurements(cm): 52 cm        Procedure Note   I decided to perform debridement today because, based on the patient's current status, there is an expectation that serial debridement will improve healing potential, reduce or control tissue infection, and prepare the tissue for surgical management. Surgical debridement is required to excise a specific, targeted area of devitalized or necrotic tissue along the margin of viable tissue  by sharp dissection. I have reviewed the individualized treatment plan for this patient who requires a debridement. Debridement is needed as part of the plan to control heavy wound colonization. I previously assessed the patient's  vascular status which is adequate. I have ensured that this ulcer has been adequately off-loaded. I am continuing to address nutritional issues, monitoring weights, encouraging the use of supplements and drawing laboratory tests when appropriate.    DEBRIDEMENT Lower Leg;Calf Left;Posterior lateral/posterior    Performed by: Ivana Maris, DPM  Authorized by: Ivana Maris, DPM  Associated wounds:   Wound 10/31/22 Lower Leg;Calf Left;Posterior      Consent:     Consent obtained:  Verbal and written    Consent given by:  Patient    Risks discussed: Yes      Time out: Immediately prior to the procedure a time out was called    Time out performed at:  01/22/2024 11:24 AM    Debridement Details:     Performed by:  Physician    Type: excisional      Level: subcutaneous tissue      Pain control:  Lidocaine 2%    Pain control administration: topical        Time taken:  01/22/2024 11:06 AM  Pre-debridement measurements    Length (cm):  17.5    Width (cm):  23    Depth (cm):  0.1    Surface Area (cm^2):  402.5    Time taken:  01/22/2024 11:07 AM    Post-debridement measurements    Length (cm):  17.5    Width (cm):  23    Depth (cm):  0.3    Percent Debrided (%):  50    Surface Area (cm^2):  402.5    Area Debrided (cm^2):  201.25    Volume (cm^3):  120.75    Devitalized tissue debrided: biofilm, fibrin and necrotic debris      Instrument:  Curette and scalpel #10    Amount of bleeding: small    Specimen sent for culture:   Specimen sent for culture comment:  No  Specimen sent for pathology:   Specimen sent for pathology comment:  No  Culture obtained:  no  Culture obtained comment:  Culture not obtained.    Hemostasis obtained with:  Pressure    Procedural pain:  0    Post-procedural pain:  0     Response to treatment:  Procedure was tolerated well   The wound bed is comprised of <25% beefy red granulation tissue and >50% necrotic tissue nonviable tissue.         Assessment & Plan   I examined the patient for an wound(s) that has been resistant to healing despite numerous interventions. The plan is to now examine any underlying characteristics that may impede healing. I decided that the wounds are not self limited and significantly impact the patient's daily living.    Wound Assessment and Plan:  The wounds are assessed as followed: stable.    Wound culture result reviewed-moderate growth of Proteus Mirabella's, light growth of Staphylococcus aureus.  Patient's wound culture was reviewed earlier and Rx Levaquin  500 daily was called in on Monday but patient has just picked up medicine this morning, he will start taking medicine starting today.    After evaluation of the patient and ulcer/wound characteristics, I decided that debridement was indicated today.    Wound Care Plan:  Advanced Wound dressings appropriate to the needs of the wound have been initiated to provide a microenvironment which can enhance healing including maintaining a moist environment, decreasing bioburden and insulating and protecting the wound.     Patient was treated with Dakin's 10 minutes soaking, gentamicin, Aquacel Ag  applied.  Patient was placed under the multilayer compressions to control his leg swelling.  Coflex zinc compression applied.    Vascular Status:   The patient's vascular status was assessed and it was determined that their vascular status is adequate for healing.     Edema Management:    The patient has edema to Bilateral lower extremities.  I reviewed the leg, ankle and foot circumference measurements to assess the patient's response to compression. The edema is  +3 pitting edema . Edema control is imperative for healing.  Patient instructed to elevate the lower extremities above the level of the heart when  possible.  Active compression will be used to gain control of the patient's edema. Edema managed through use of Coban 2. I described the possible complications compression wraps and told the patient to remove them if they felt too tight or became soiled, but otherwise they will leave the wraps in place until the next visit..     Nutrition:    Nutrition status is adequate.  I made suggestions regarding appropriate protein intake, vitamin supplementation, and adequate hydration. Recommendations include healthy diet    Other comorbidities that may affect wound healing: Non-healing wounds are a symptom of their significant comorbid diseases. edema and lymphedema    Coordination of Care: I recommended none needed at this time    Patient Education:  Dressing management. S/S infection and to call clinic/ED precautions if any were to occur.   Patient verbalized understanding and agreed with POC.   Home health was not indicated at this time..    The patient will continue to be seen on a regular basis.  Follow up in clinic in 1 week and PRN interval nurse visits for dressing changes.    Ivana Maris, DPM  01/22/2024          [1]   Social History  Socioeconomic History    Marital status: Married   Tobacco Use    Smoking status: Heavy Smoker     Current packs/day: 1.00     Types: Cigarettes    Smokeless tobacco: Current   Substance and Sexual Activity    Alcohol use: Yes     Comment: occasionally     Drug use: Yes     Comment: marijuana     Social Drivers of Health     Food Insecurity: Unknown (11/07/2022)    Hunger Vital Sign     Worried About Running Out of Food in the Last Year: Never true    Received from [employer]    Social Network    Received from [facility]    HITS   [2] No Known Allergies  [3]   Current Outpatient Medications:     albuterol  sulfate HFA (PROVENTIL ) 108 (90 Base) MCG/ACT inhaler, Inhale 2 puffs into the lungs every 4 (four) hours as needed for Wheezing or Shortness of Breath (coughing) Dispense with  spacer, Disp: 6.7 g, Rfl: 0    candesartan (ATACAND) 16 MG tablet, Take 1 tablet (16 mg) by mouth 2 (two) times daily, Disp: , Rfl:     carvedilol (COREG) 25 MG tablet, Take 1 tablet (25 mg) by mouth daily In the morning (Patient taking differently: Take 1 tablet (25 mg) by mouth once daily Patient reports he takes 25 mg. BID), Disp: , Rfl:     cloNIDine (CATAPRES) 0.3 MG tablet, Take 1 tablet (0.3 mg) by mouth 2 (two) times daily, Disp: , Rfl:     furosemide (LASIX) 40 MG  tablet, Take 1 tablet (40 mg) by mouth 2 (two) times daily, Disp: , Rfl:     hydrALAZINE (APRESOLINE) 50 MG tablet, Take 1 tablet (50 mg) by mouth 2 (two) times daily, Disp: , Rfl:     naproxen  (NAPROSYN ) 500 MG tablet, Take 1 tablet (500 mg) by mouth 2 (two) times daily as needed, Disp: , Rfl:     omeprazole (PRILOSEC) 40 MG capsule, Take 1 capsule (40 mg) by mouth daily, Disp: , Rfl:     potassium chloride (KLOR-CON M20) 20 MEQ CR tablet, Take 1 tablet (20 mEq) by mouth once daily, Disp: , Rfl:     amlodipine-benazepril (LOTREL) 10-20 MG per capsule, Take 1 capsule by mouth daily (Patient not taking: Reported on 01/22/2024), Disp: , Rfl:     benzonatate  (TESSALON ) 100 MG capsule, Take 1 capsule (100 mg) by mouth 3 (three) times daily as needed for Cough (Patient not taking: Reported on 01/22/2024), Disp: 15 capsule, Rfl: 0    carvedilol (COREG) 12.5 MG tablet, Take 1 tablet (12.5 mg) by mouth nightly (Patient not taking: Reported on 01/22/2024), Disp: , Rfl:     hydrochlorothiazide (HYDRODIURIL) 25 MG tablet, Take 1 tablet (25 mg) by mouth daily (Patient not taking: Reported on 01/22/2024), Disp: , Rfl:

## 2024-01-23 NOTE — Telephone Encounter (Signed)
 error

## 2024-01-29 ENCOUNTER — Ambulatory Visit: Admitting: Foot & Ankle Surgery

## 2024-01-29 VITALS — BP 150/80 | HR 68 | Temp 97.7°F

## 2024-01-29 DIAGNOSIS — L97222 Non-pressure chronic ulcer of left calf with fat layer exposed: Secondary | ICD-10-CM | POA: Insufficient documentation

## 2024-01-29 DIAGNOSIS — R600 Localized edema: Secondary | ICD-10-CM

## 2024-01-29 DIAGNOSIS — F17210 Nicotine dependence, cigarettes, uncomplicated: Secondary | ICD-10-CM | POA: Insufficient documentation

## 2024-01-29 DIAGNOSIS — I890 Lymphedema, not elsewhere classified: Secondary | ICD-10-CM | POA: Insufficient documentation

## 2024-01-29 DIAGNOSIS — I10 Essential (primary) hypertension: Secondary | ICD-10-CM | POA: Insufficient documentation

## 2024-01-29 NOTE — Progress Notes (Signed)
 [facility]ulatory [facility]  [facility]:  [address] [place], [place] [address]  T [phone]  F [phone]  Wound Follow-up - Nursing Documentation    PATIENT: [name] DOB: 11-May-1966   MR #: [id number]  AGE: 58 y.o.    REFERRING MD:  Elisabeth Lamar DASEN, MD PRIMARY MD: Elisabeth Lamar DASEN, MD      Chief Complaint   Patient presents with    Edema    Wound Check        Assessment & Plan     Case Management:  Patient identification verified using two identifiers.  This patient has been determined to be at risk for a fall, extra precautionary measures have been taken to ensure their safety.      RTC: one week.    Treatment:  LLE: Dakin's soak , gent aquacel AG, sorbex, unna boot, coban 2 in substitute for zinc Coflex . (Coflex Zinc compression Wrap out of stock)  RLE: xeroform on lateral area, Zinc coflex     Additional Wound Procedures    Performed by: Ziyon Soltau IV V, RN  Authorized by: Orlan Munch, DPM  Associated wounds:   Wound 10/31/22 Lower Leg;Calf Left;Posterior  Wound 10/31/20 Edema/Edema Management Lower Leg Anterior;Left    Body area:  Lower extremity  Lower extremity location:  L lower leg    Compression wrap type: multi-layer    Wrap location: left    Wrap location equal debrided location?: yes    Debrided location: left    Multi-layer: 2 layer     Procedure was performed in a clean field. The leg was washed with soap and water and then patted dry. Moisturizer was applied to the leg. A multi-layer compression bandage was applied per manufacturer's instructions. Patient tolerated procedure well and without complaints of pain or discomfort. Patient was educated regarding signs and symptoms of overcompression, including unrelieved pain, toes turning red or purple, or numbness in foot.  Patient was instructed to manually remove outer layer (without scissors) and contact clinic immediately.       Patient Education:  Elevate legs for 30 mins, 3x/day, above the  level of the heart. Above the level of the heart, is key here. The easiest way to achieve this is to lay down flat and prop your legs up with at least 2 pillows.    Patient and/or Caregiver verbalized understanding of dressing changes/plan of care.    Charise Leinbach IV LULLA Ket, RN   01/29/2024

## 2024-01-29 NOTE — Progress Notes (Signed)
 Ambulatory  Wound Healing and Hyperbaric Medicine    Wound Follow-Up - Provider Documentation    PATIENT: [name] DOB: 01-17-1966   MR #: [id number]  AGE: 58 y.o.    REFERRING MD:  Elisabeth Lamar DASEN, MD PRIMARY MD: Elisabeth Lamar DASEN, MD        Chief Complaint   Patient presents with    Edema    Wound Check      The patient was seen in the office today and a follow up evaluation was performed, including a history & focused exam.     [name] is a 58 year old black male who presents today with Patient reports he has had recurrent wounds and swelling of both lower legs for the past two years.  He was being treated at a wound center in [place] but moved to [place]  in July 2023.  He has been self treating with gauze/coban dressings and took a prescription for penicillin in January 2024.  PMH significant for morbid obesity, current tobacco user, hypertention, GERD and chronic back pain. Negative for diabetes.   The patient states his chief complaint is wounds of bilateral lower legs.    History of Present Illness   [name] is a 58 y.o. male who presents to [facility] wound healing center bilateral lower extremity lymphedema, edema and chronic left leg ulcers.  Patient denies fever, chills, nausea, vomiting, and leg pain.     Patient's right leg ulcer is healed however patient still have open left leg ulcer.  Patient has bilateral lower extremity lymphedema/leg edema.    Patient has been treated with Dakin's 10 minutes soaking, gentamicin, Zetuvit.  He is under multilayer compression for his leg for his leg swelling control.    Interval HPI 12/25/2023: Patient was lost in follow-up since November of last year.  Patient lost his transportation and was not able to come into wound center for follow-up until today.  Patient has been using zinc Coflex at home to control his leg swelling until he ran out of his supply.  Patient has been using Kerlix and Coban to manage his left leg  ulcer and swelling.    Interval HPI 01/01/2024: Patient was treated with Dakin's 10 minutes soaking, Aquacel Ag applied to his leg ulcer.  Patient was placed under Coflex compression on left and Coban to compression on right last week.  Patient stated that he received a phone call regarding antibiotic therapy per his leg wound infection however he has not started the medication yet and he will start today.    Interval HPI 01/08/2024: Patient was treated with Dakin's 10 minutes soaking, gentamicin, Aquacel Ag to his leg ulcer.  Patient is under Coflex compression to control his leg swelling.  Patient has been taking Levaquin  for last 2 days.  Patient is still taking Levaquin .    Interval HPI 01/22/2024: Patient was treated with Dakin's 10-minute soaking, gentamicin, Aquacel Ag to his leg ulcers.  Patient on the Coflex compression to control his leg swelling.  Patient has finished taking Levaquin .  Patient missed last week's appointment secondary to patient was unable to find parking at the hospital for over 1 hour.  Patient left due to unable to find a parking spot.    Interval HPI 01/29/2024: Patient was treated with a continuous soaking, gentamicin, Aquacel Ag to his leg ulcers.  Patient is under Unna boot/Coban 2 modify compression to control his leg swelling.    Past Medical History  Past Medical History:   Diagnosis Date    GERD (gastroesophageal reflux disease)     Hypertension     Lower back pain        Past Surgical History   No past surgical history on file.    Family History   No family history on file.    Social History   Social History[1]    Allergies   Allergies[2]    Medications   Current Medications[3]    Physical Exam     Vitals:    01/29/24 1123   BP: 150/80   Pulse: 68   Temp: 97.7 F (36.5 C)         There is no height or weight on file to calculate BMI.    General:  Patient appears their stated age, well-nourished.  Alert and in no apparent distress.  Lungs: Respiratory effort unlabored, no signs  of distress  Vascular: normal capillary refill  Skin: see focused wound care exam  Neuro: AAOx3 normal speech and appropriate affect, see wound care exam for sensory exam    Focused Wound Care Exam   Wound Assessment:   Wound 10/31/22 Lower Leg;Calf Left;Posterior (Active)   Date First Assessed: 10/31/22   Location: Lower Leg;Calf  Wound Location Orientation: Left;Posterior  Wound Description: lateral/posterior  Present on Original Admission: Yes      Assessments 01/29/2024 11:26 AM 01/29/2024 11:27 AM   Wound Image       Wound Base Description Red;Moist;Full Thickness --   Peri-wound Description Hyperpigmented --   Wound Length (cm) 18.2 cm 18.2 cm   Wound Width (cm) 17.3 cm 17.3 cm   Wound Depth (cm) 0.1 cm --   Wound Surface Area (cm^2) 247.29 cm^2 247.29 cm^2   Wound Volume (cm^3) 16.486 cm^3 --   Closure Open --   Drainage Amount Moderate --   Drainage Description Serous --   Margins Defined edges --   Treatments Cleansed;Compression Wrap1 --   Compression Wrap 1 Coflex TLC x/Zinc --   Topical Dakins Solution --       Active Orders   Date Order Priority Status Authorizing Provider   01/29/24 1212 Additional Wound Procedures Routine Active Orlan Munch, DPM   01/29/24 1152 DEBRIDEMENT Lower Leg;Calf Left;Posterior lateral/posterior Routine Active Orlan Munch, DPM       Inactive Orders   Date Order Priority Status Authorizing Provider   01/22/24 1124 DEBRIDEMENT Lower Leg;Calf Left;Posterior lateral/posterior Routine Completed Orlan Munch, [facility]   01/08/24 1232 Additional Wound Procedures Routine Completed Orlan Munch, [facility]   01/08/24 1208 DEBRIDEMENT Lower Leg;Calf Left;Posterior lateral/posterior Routine Completed Orlan Munch, DPM   01/01/24 1157 Additional Wound Procedures Routine Completed Orlan Munch, DPM   01/01/24 1157 DEBRIDEMENT Lower Leg;Calf Left;Lateral lateral/posterior Routine Completed Orlan Munch, DPM   12/25/23 1206 DEBRIDEMENT Lower Leg;Calf Left;Lateral Routine Completed Orlan Munch, DPM   08/14/23 1200 DEBRIDEMENT Lower Leg;Calf Left;Lateral Routine Completed Orlan Munch, DPM   07/03/23 1140 DEBRIDEMENT Lower Leg;Calf Left;Lateral Routine Completed Orlan Munch, DPM   06/19/23 1209 DEBRIDEMENT Lower Leg;Calf Left;Lateral Routine Completed Orlan Munch, DPM   06/05/23 1205 DEBRIDEMENT Lower Leg;Calf Left;Lateral Routine Completed Orlan Munch, DPM   05/22/23 1229 DEBRIDEMENT Lower Leg;Calf Left;Lateral Routine Completed Orlan Munch, DPM   05/08/23 1202 DEBRIDEMENT Lower Leg;Calf Left;Lateral Routine Completed Orlan Munch, DPM       Wound 10/31/20 Edema/Edema Management Lower Leg Anterior;Left (Active)   Date First Assessed/Time First Assessed: 10/31/20 0800   Primary Wound Type: Edema/Edema Management  Edema extremity:  LLE  Edema Swelling: Pitting  Location: Lower Leg  Wound Location Orientation: Anterior;Left      Assessments 01/29/2024 11:26 AM   Wound Image     Treatments Cleansed;Compression Wrap1   Compression Wrap 1 Coflex TLC x/Zinc       Active Orders   Date Order Priority Status Authorizing Provider   01/29/24 1212 Additional Wound Procedures Routine Active Orlan Munch, DPM       Inactive Orders   Date Order Priority Status Authorizing Provider   01/22/24 1154 Additional Wound Procedures Routine Completed Orlan Munch, [facility]   01/08/24 1232 Additional Wound Procedures Routine Completed Orlan Munch, DPM   01/01/24 1157 Additional Wound Procedures Routine Completed Orlan Munch, DPM   08/14/23 1203 Additional Wound Procedures Routine Completed Orlan Munch, DPM   06/05/23 1215 Additional Wound Procedures Routine Completed Orlan Munch, DPM   05/22/23 1221 Additional Wound Procedures Routine Completed Orlan Munch, DPM   05/08/23 1230 Additional Wound Procedures Routine Completed Orlan Munch, DPM       Wound 10/31/20 Edema/Edema Management Lower Leg Anterior;Right (Active)   Date First Assessed/Time First Assessed: 10/31/20 0800   Primary Wound  Type: Edema/Edema Management  Edema extremity: RLE  Edema Swelling: Pitting  Location: Lower Leg  Wound Location Orientation: Anterior;Right      Assessments 01/29/2024 11:25 AM   Wound Image     Treatments Cleansed;Compression Wrap1   Compression Wrap 1 Coflex TLC x/Zinc       Inactive Orders   Date Order Priority Status Authorizing Provider   01/22/24 1154 Additional Wound Procedures Routine Completed Orlan Munch, [facility]   01/08/24 1232 Additional Wound Procedures Routine Completed Orlan Munch, DPM   01/01/24 1157 Additional Wound Procedures Routine Completed Orlan Munch, DPM   08/14/23 1203 Additional Wound Procedures Routine Completed Orlan Munch, DPM   06/05/23 1215 Additional Wound Procedures Routine Completed Orlan Munch, DPM   05/22/23 1221 Additional Wound Procedures Routine Completed Orlan Munch, DPM   05/08/23 1230 Additional Wound Procedures Routine Completed Orlan Munch, DPM       Edema  RLE Edema: Pitting; 1+ barely detectable  R Foot Measurements(cm): 26.5 cm  R Ankle Measurements(cm): 26.5 cm  R Calf Measurements(cm): 51 cm  RLE Treatment: RLE Multilayer  LLE Edema: Pitting; 1+ barely detectable  L Foot Measurements(cm): 26 cm  L Ankle Measurements(cm): 27 cm  L Calf Measurements(cm): 52 cm  LLE Treatment: LLE Multilayer          Procedure Note   I decided to perform debridement today because, based on the patient's current status, there is an expectation that serial debridement will improve healing potential, reduce or control tissue infection, and prepare the tissue for surgical management. Surgical debridement is required to excise a specific, targeted area of devitalized or necrotic tissue along the margin of viable tissue by sharp dissection. I have reviewed the individualized treatment plan for this patient who requires a debridement. Debridement is needed as part of the plan to control heavy wound colonization. I previously assessed the patient's vascular status which is  adequate. I have ensured that this ulcer has been adequately off-loaded. I am continuing to address nutritional issues, monitoring weights, encouraging the use of supplements and drawing laboratory tests when appropriate.    DEBRIDEMENT Lower Leg;Calf Left;Posterior lateral/posterior    Performed by: Orlan Munch, DPM  Authorized by: Orlan Munch, DPM  Associated wounds:   Wound 10/31/22 Lower Leg;Calf Left;Posterior      Consent:     Consent obtained:  Verbal  and written    Consent given by:  Patient    Risks discussed: Yes      Time out: Immediately prior to the procedure a time out was called    Time out performed at:  01/29/2024 11:52 AM    Debridement Details:     Performed by:  Physician    Type: excisional      Level: subcutaneous tissue      Pain control:  Lidocaine 2%    Pain control administration: topical        Time taken:  01/29/2024 11:26 AM  Pre-debridement measurements    Length (cm):  18.2    Width (cm):  17.3    Depth (cm):  0.1    Surface Area (cm^2):  314.86    Time taken:  01/29/2024 11:27 AM    Post-debridement measurements    Length (cm):  18.2    Width (cm):  17.3    Depth (cm):  0.4    Percent Debrided (%):  50    Surface Area (cm^2):  314.86    Area Debrided (cm^2):  157.43    Volume (cm^3):  125.94    Devitalized tissue debrided: biofilm, fibrin and necrotic debris      Instrument:  Curette and scalpel #10    Amount of bleeding: small    Specimen sent for culture:   Specimen sent for culture comment:  No  Specimen sent for pathology:   Specimen sent for pathology comment:  No  Culture obtained:  no  Culture obtained comment:  Culture not obtained.    Hemostasis obtained with:  Pressure    Procedural pain:  0    Post-procedural pain:  0    Response to treatment:  Procedure was tolerated well   The wound bed is comprised of <25% beefy red and friable granulation tissue and >50% slough/fibrin and necrotic tissue nonviable tissue.         Assessment & Plan   I examined the patient for an  wound(s) that has been resistant to healing despite numerous interventions. The plan is to now examine any underlying characteristics that may impede healing. I decided that the wounds are not self limited and significantly impact the patient's daily living.    Wound Assessment and Plan:  The wounds are assessed as followed: stable.    Wound culture result reviewed-moderate growth of Proteus Mirabella's, light growth of Staphylococcus aureus.  Patient's wound culture was reviewed earlier and Rx Levaquin  500 daily was called in on Monday but patient has just picked up medicine this morning, he will start taking medicine starting today.    After evaluation of the patient and ulcer/wound characteristics, I decided that debridement was indicated today.    Wound Care Plan:  Advanced Wound dressings appropriate to the needs of the wound have been initiated to provide a microenvironment which can enhance healing including maintaining a moist environment, decreasing bioburden and insulating and protecting the wound.     Patient was treated with Dakin's 10 minutes soaking, gentamicin, Aquacel Ag applied.  Patient was placed under the multilayer compressions to control his leg swelling.  Unna boot/Coban 2 modified compression applied    Vascular Status:   The patient's vascular status was assessed and it was determined that their vascular status is adequate for healing.     Edema Management:    The patient has edema to Bilateral lower extremities.  I reviewed the leg, ankle and foot circumference measurements to assess the patient's  response to compression. The edema is  +3 pitting edema . Edema control is imperative for healing.  Patient instructed to elevate the lower extremities above the level of the heart when possible.  Active compression will be used to gain control of the patient's edema. Edema managed through use of Coban 2. I described the possible complications compression wraps and told the patient to remove them  if they felt too tight or became soiled, but otherwise they will leave the wraps in place until the next visit..     Nutrition:    Nutrition status is adequate.  I made suggestions regarding appropriate protein intake, vitamin supplementation, and adequate hydration. Recommendations include healthy diet    Other comorbidities that may affect wound healing: Non-healing wounds are a symptom of their significant comorbid diseases. edema and lymphedema    Coordination of Care: I recommended none needed at this time    Patient Education:  Dressing management. S/S infection and to call clinic/ED precautions if any were to occur.   Patient verbalized understanding and agreed with POC.   Home health was not indicated at this time..    The patient will continue to be seen on a regular basis.  Follow up in clinic in 1 week and PRN interval nurse visits for dressing changes.    Roosvelt Needle, DPM  01/29/2024          [1]   Social History  Socioeconomic History    Marital status: Married   Tobacco Use    Smoking status: Heavy Smoker     Current packs/day: 1.00     Types: Cigarettes    Smokeless tobacco: Current   Substance and Sexual Activity    Alcohol use: Yes     Comment: occasionally     Drug use: Yes     Comment: marijuana     Social Drivers of Health     Food Insecurity: Unknown (11/07/2022)    Hunger Vital Sign     Worried About Running Out of Food in the Last Year: Never true    Received from [employer]    Social Network    Received from [facility]    HITS   [2] No Known Allergies  [3]   Current Outpatient Medications:     albuterol  sulfate HFA (PROVENTIL ) 108 (90 Base) MCG/ACT inhaler, Inhale 2 puffs into the lungs every 4 (four) hours as needed for Wheezing or Shortness of Breath (coughing) Dispense with spacer, Disp: 6.7 g, Rfl: 0    amlodipine-benazepril (LOTREL) 10-20 MG per capsule, Take 1 capsule by mouth daily, Disp: , Rfl:     benzonatate  (TESSALON ) 100 MG capsule, Take 1 capsule (100 mg) by mouth 3 (three)  times daily as needed for Cough, Disp: 15 capsule, Rfl: 0    candesartan (ATACAND) 16 MG tablet, Take 1 tablet (16 mg) by mouth 2 (two) times daily, Disp: , Rfl:     carvedilol (COREG) 12.5 MG tablet, Take 1 tablet (12.5 mg) by mouth nightly, Disp: , Rfl:     carvedilol (COREG) 25 MG tablet, Take 1 tablet (25 mg) by mouth daily In the morning (Patient taking differently: Take 1 tablet (25 mg) by mouth once daily Patient reports he takes 25 mg. BID), Disp: , Rfl:     cloNIDine (CATAPRES) 0.3 MG tablet, Take 1 tablet (0.3 mg) by mouth 2 (two) times daily, Disp: , Rfl:     furosemide (LASIX) 40 MG tablet, Take 1 tablet (40 mg) by mouth  2 (two) times daily, Disp: , Rfl:     hydrALAZINE (APRESOLINE) 50 MG tablet, Take 1 tablet (50 mg) by mouth 2 (two) times daily, Disp: , Rfl:     hydrochlorothiazide (HYDRODIURIL) 25 MG tablet, Take 1 tablet (25 mg) by mouth daily, Disp: , Rfl:     naproxen  (NAPROSYN ) 500 MG tablet, Take 1 tablet (500 mg) by mouth 2 (two) times daily as needed, Disp: , Rfl:     omeprazole (PRILOSEC) 40 MG capsule, Take 1 capsule (40 mg) by mouth daily, Disp: , Rfl:     potassium chloride (KLOR-CON M20) 20 MEQ CR tablet, Take 1 tablet (20 mEq) by mouth once daily, Disp: , Rfl:

## 2024-02-05 ENCOUNTER — Ambulatory Visit

## 2024-02-12 ENCOUNTER — Ambulatory Visit: Admitting: Foot & Ankle Surgery

## 2024-02-12 VITALS — BP 165/84 | HR 62 | Temp 97.7°F

## 2024-02-12 DIAGNOSIS — I890 Lymphedema, not elsewhere classified: Secondary | ICD-10-CM | POA: Insufficient documentation

## 2024-02-12 DIAGNOSIS — F17210 Nicotine dependence, cigarettes, uncomplicated: Secondary | ICD-10-CM | POA: Insufficient documentation

## 2024-02-12 DIAGNOSIS — B964 Proteus (mirabilis) (morganii) as the cause of diseases classified elsewhere: Secondary | ICD-10-CM | POA: Insufficient documentation

## 2024-02-12 DIAGNOSIS — L97222 Non-pressure chronic ulcer of left calf with fat layer exposed: Secondary | ICD-10-CM | POA: Insufficient documentation

## 2024-02-12 DIAGNOSIS — R600 Localized edema: Secondary | ICD-10-CM

## 2024-02-12 DIAGNOSIS — B9561 Methicillin susceptible Staphylococcus aureus infection as the cause of diseases classified elsewhere: Secondary | ICD-10-CM | POA: Insufficient documentation

## 2024-02-12 NOTE — Progress Notes (Signed)
 [facility]    Wound Follow-Up - Provider Documentation    PATIENT: [name] DOB: April 12, 1966   MR #: [id number]  AGE: 58 y.o.    REFERRING MD:  Gregory Lamar DASEN, MD PRIMARY MD: Gregory Lamar DASEN, MD        Chief Complaint   Patient presents with    Wound Check    Edema      The patient was seen in the office today and a follow up evaluation was performed, including a history & focused exam.     [name] is a 58 year old black male who presents today with Patient reports he has had recurrent wounds and swelling of both lower legs for the past two years.  He was being treated at a wound center in [place] but moved to [place]  in July 2023.  He has been self treating with gauze/coban dressings and took a prescription for penicillin in January 2024.  PMH significant for morbid obesity, current tobacco user, hypertention, GERD and chronic back pain. Negative for diabetes.   The patient states his chief complaint is wounds of bilateral lower legs.    History of Present Illness   [name] is a 58 y.o. male who presents to [facility] wound healing center bilateral lower extremity lymphedema, edema and chronic left leg ulcers.  Patient denies fever, chills, nausea, vomiting, and leg pain.     Patient's right leg ulcer is healed however patient still have open left leg ulcer.  Patient has bilateral lower extremity lymphedema/leg edema.    Patient has been treated with Dakin's 10 minutes soaking, gentamicin, Zetuvit.  He is under multilayer compression for his leg for his leg swelling control.    Interval HPI 12/25/2023: Patient was lost in follow-up since November of last year.  Patient lost his transportation and was not able to come into wound center for follow-up until today.  Patient has been using zinc Coflex at home to control his leg swelling until he ran out of his supply.  Patient has been using Kerlix and Coban to manage his left leg  ulcer and swelling.    Interval HPI 01/01/2024: Patient was treated with Dakin's 10 minutes soaking, Aquacel Ag applied to his leg ulcer.  Patient was placed under Coflex compression on left and Coban to compression on right last week.  Patient stated that he received a phone call regarding antibiotic therapy per his leg wound infection however he has not started the medication yet and he will start today.    Interval HPI 01/08/2024: Patient was treated with Dakin's 10 minutes soaking, gentamicin, Aquacel Ag to his leg ulcer.  Patient is under Coflex compression to control his leg swelling.  Patient has been taking Levaquin  for last 2 days.  Patient is still taking Levaquin .    Interval HPI 01/22/2024: Patient was treated with Dakin's 10-minute soaking, gentamicin, Aquacel Ag to his leg ulcers.  Patient on the Coflex compression to control his leg swelling.  Patient has finished taking Levaquin .  Patient missed last week's appointment secondary to patient was unable to find parking at the hospital for over 1 hour.  Patient left due to unable to find a parking spot.    Interval HPI 01/29/2024: Patient was treated with Dakin's 10 minutes soaking, gentamicin, Aquacel Ag to his leg ulcers.  Patient is under Unna boot/Coban 2 modify compression to control his leg swelling.    Interval HPI 02/12/2024: Patient  treated with Dakin's 10 minutes soaking soaking, gentamicin, Aquacel Ag to his leg ulcers.  Patient on multilayer compression to control his leg swelling.    Past Medical History     Past Medical History:   Diagnosis Date    GERD (gastroesophageal reflux disease)     Hypertension     Lower back pain        Past Surgical History   No past surgical history on file.    Family History   No family history on file.    Social History   Social History[1]    Allergies   Allergies[2]    Medications   Current Medications[3]    Physical Exam     Vitals:    02/12/24 1123   BP: 165/84   Pulse: 62   Temp: 97.7 F (36.5 C)         There  is no height or weight on file to calculate BMI.    General:  Patient appears their stated age, well-nourished.  Alert and in no apparent distress.  Lungs: Respiratory effort unlabored, no signs of distress  Vascular: normal capillary refill  Skin: see focused wound care exam  Neuro: AAOx3 normal speech and appropriate affect, see wound care exam for sensory exam    Focused Wound Care Exam   Wound Assessment:   Wound 10/31/22 Lower Leg;Calf Left;Posterior (Active)   Date First Assessed: 10/31/22   Location: Lower Leg;Calf  Wound Location Orientation: Left;Posterior  Wound Description: lateral/posterior  Present on Original Admission: Yes      Assessments 02/12/2024 11:46 AM 02/12/2024 11:47 AM   Wound Image       Wound Base Description Full Thickness;Scattered;Red --   Peri-wound Description Hyperpigmented --   Wound Length (cm) 17.9 cm 18 cm   Wound Width (cm) 20.3 cm 20.3 cm   Wound Depth (cm) 0.1 cm 0.4 cm   Wound Surface Area (cm^2) 285.39 cm^2 286.98 cm^2   Wound Volume (cm^3) 19.026 cm^3 76.529 cm^3   Closure Open --   Drainage Amount Scant --   Drainage Description Serous --   Margins Undefined edges --   Treatments Cleansed;Compression Wrap1 --   Compression Wrap 1 Coban 2 or equivalent --   Topical Dakins Solution --       Active Orders   Date Order Priority Status Authorizing Provider   02/12/24 1142 DEBRIDEMENT Lower Leg;Calf Left;Posterior lateral/posterior Routine Active Orlan Munch, DPM       Inactive Orders   Date Order Priority Status Authorizing Provider   01/29/24 1212 Additional Wound Procedures Routine Completed Orlan Munch, DPM   01/29/24 1152 DEBRIDEMENT Lower Leg;Calf Left;Posterior lateral/posterior Routine Completed Orlan Munch, DPM   01/22/24 1124 DEBRIDEMENT Lower Leg;Calf Left;Posterior lateral/posterior Routine Completed Orlan Munch, [facility]   01/08/24 1232 Additional Wound Procedures Routine Completed Orlan Munch, [facility]   01/08/24 1208 DEBRIDEMENT Lower Leg;Calf Left;Posterior  lateral/posterior Routine Completed Orlan Munch, DPM   01/01/24 1157 Additional Wound Procedures Routine Completed Orlan Munch, DPM   01/01/24 1157 DEBRIDEMENT Lower Leg;Calf Left;Lateral lateral/posterior Routine Completed Orlan Munch, DPM   12/25/23 1206 DEBRIDEMENT Lower Leg;Calf Left;Lateral Routine Completed Orlan Munch, DPM   08/14/23 1200 DEBRIDEMENT Lower Leg;Calf Left;Lateral Routine Completed Orlan Munch, DPM   07/03/23 1140 DEBRIDEMENT Lower Leg;Calf Left;Lateral Routine Completed Orlan Munch, DPM   06/19/23 1209 DEBRIDEMENT Lower Leg;Calf Left;Lateral Routine Completed Orlan Munch, DPM   06/05/23 1205 DEBRIDEMENT Lower Leg;Calf Left;Lateral Routine Completed Orlan Munch, DPM   05/22/23 1229 DEBRIDEMENT Lower Leg;Calf Left;Lateral  Routine Completed Orlan Munch, DPM   05/08/23 1202 DEBRIDEMENT Lower Leg;Calf Left;Lateral Routine Completed Orlan Munch, DPM       Wound 10/31/20 Edema/Edema Management Lower Leg Anterior;Left (Active)   Date First Assessed/Time First Assessed: 10/31/20 0800   Primary Wound Type: Edema/Edema Management  Edema extremity: LLE  Edema Swelling: Pitting  Location: Lower Leg  Wound Location Orientation: Anterior;Left      Assessments 02/12/2024 11:44 AM   Wound Image     Treatments Cleansed;Compression Wrap1   Compression Wrap 1 Coflex TLC x/Zinc       Inactive Orders   Date Order Priority Status Authorizing Provider   01/29/24 1212 Additional Wound Procedures Routine Completed Orlan Munch, DPM   01/22/24 1154 Additional Wound Procedures Routine Completed Orlan Munch, [facility]   01/08/24 1232 Additional Wound Procedures Routine Completed Orlan Munch, DPM   01/01/24 1157 Additional Wound Procedures Routine Completed Orlan Munch, DPM   08/14/23 1203 Additional Wound Procedures Routine Completed Orlan Munch, DPM   06/05/23 1215 Additional Wound Procedures Routine Completed Orlan Munch, DPM   05/22/23 1221 Additional Wound Procedures Routine  Completed Orlan Munch, DPM   05/08/23 1230 Additional Wound Procedures Routine Completed Orlan Munch, DPM       Wound 10/31/20 Edema/Edema Management Lower Leg Anterior;Right (Active)   Date First Assessed/Time First Assessed: 10/31/20 0800   Primary Wound Type: Edema/Edema Management  Edema extremity: RLE  Edema Swelling: Pitting  Location: Lower Leg  Wound Location Orientation: Anterior;Right      Assessments 02/12/2024 11:44 AM   Wound Image     Treatments Cleansed;Compression Wrap1   Compression Wrap 1 Coflex TLC x/Zinc       Inactive Orders   Date Order Priority Status Authorizing Provider   01/22/24 1154 Additional Wound Procedures Routine Completed Orlan Munch, [facility]   01/08/24 1232 Additional Wound Procedures Routine Completed Orlan Munch, [facility]   01/01/24 1157 Additional Wound Procedures Routine Completed Orlan Munch, DPM   08/14/23 1203 Additional Wound Procedures Routine Completed Orlan Munch, [facility]   06/05/23 1215 Additional Wound Procedures Routine Completed Orlan Munch, DPM   05/22/23 1221 Additional Wound Procedures Routine Completed Orlan Munch, DPM   05/08/23 1230 Additional Wound Procedures Routine Completed Orlan Munch, DPM         Edema  RLE Edema: Brawny; Pitting; 1+ barely detectable  R Foot Measurements(cm): 27 cm  R Ankle Measurements(cm): 26.5 cm  R Calf Measurements(cm): 51.5 cm  RLE Treatment: RLE Multilayer  RLE Multilayer Tx: Coban 2  LLE Edema: Brawny; Pitting; 1+ barely detectable  L Foot Measurements(cm): 26.5 cm  L Ankle Measurements(cm): 27 cm  L Calf Measurements(cm): 52 cm  LLE Treatment: LLE Multilayer  LLE Multilayer Tx: Coban 2            Procedure Note   I decided to perform debridement today because, based on the patient's current status, there is an expectation that serial debridement will improve healing potential, reduce or control tissue infection, and prepare the tissue for surgical management. Surgical debridement is required to excise a specific,  targeted area of devitalized or necrotic tissue along the margin of viable tissue by sharp dissection. I have reviewed the individualized treatment plan for this patient who requires a debridement. Debridement is needed as part of the plan to control heavy wound colonization. I previously assessed the patient's vascular status which is adequate. I have ensured that this ulcer has been adequately off-loaded. I am continuing to address nutritional issues, monitoring weights, encouraging the use of  supplements and drawing laboratory tests when appropriate.    DEBRIDEMENT Lower Leg;Calf Left;Posterior lateral/posterior    Performed by: Orlan Munch, DPM  Authorized by: Orlan Munch, DPM  Associated wounds:   Wound 10/31/22 Lower Leg;Calf Left;Posterior      Consent:     Consent obtained:  Verbal and written    Consent given by:  Patient    Risks discussed: Yes      Time out: Immediately prior to the procedure a time out was called    Time out performed at:  02/12/2024 11:48 AM    Debridement Details:     Performed by:  Physician    Type: excisional      Level: subcutaneous tissue      Pain control:  Lidocaine 2%    Pain control administration: topical        Time taken:  02/12/2024 11:46 AM  Pre-debridement measurements    Length (cm):  17.9    Width (cm):  20.3    Depth (cm):  0.1    Surface Area (cm^2):  363.37    Time taken:  02/12/2024 11:47 AM    Post-debridement measurements    Length (cm):  18    Width (cm):  20.3    Depth (cm):  0.4    Percent Debrided (%):  30    Surface Area (cm^2):  365.4    Area Debrided (cm^2):  109.62    Volume (cm^3):  146.16    Devitalized tissue debrided: biofilm, fibrin and necrotic debris      Instrument:  Curette and scalpel #10    Amount of bleeding: small    Specimen sent for culture:   Specimen sent for culture comment:  No  Specimen sent for pathology:   Specimen sent for pathology comment:  No  Culture obtained:  no  Culture obtained comment:  Culture not obtained.     Hemostasis obtained with:  Pressure    Procedural pain:  0    Post-procedural pain:  0    Response to treatment:  Procedure was tolerated well   The wound bed is comprised of <25% beefy red granulation tissue and >50% slough/fibrin and necrotic tissue nonviable tissue.         Assessment & Plan   I examined the patient for an wound(s) that has been resistant to healing despite numerous interventions. The plan is to now examine any underlying characteristics that may impede healing. I decided that the wounds are not self limited and significantly impact the patient's daily living.    Wound Assessment and Plan:  The wounds are assessed as followed: stable.    Wound culture result reviewed-moderate growth of Proteus Mirabella's, light growth of Staphylococcus aureus.  Patient's wound culture was reviewed earlier and Rx Levaquin  500 daily was called in on Monday but patient has just picked up medicine this morning, he will start taking medicine starting today.    After evaluation of the patient and ulcer/wound characteristics, I decided that debridement was indicated today.    Wound Care Plan:  Advanced Wound dressings appropriate to the needs of the wound have been initiated to provide a microenvironment which can enhance healing including maintaining a moist environment, decreasing bioburden and insulating and protecting the wound.     Patient was treated with Dakin's 10 minutes soaking, gentamicin, Aquacel Ag applied.  Patient was placed under the multilayer compressions to control his leg swelling.  Unna boot/Coban 2 modified compression applied    Vascular Status:  The patient's vascular status was assessed and it was determined that their vascular status is adequate for healing.     Edema Management:    The patient has edema to Bilateral lower extremities.  I reviewed the leg, ankle and foot circumference measurements to assess the patient's response to compression. The edema is +3 pitting edema. Edema control is  imperative for healing.  Patient instructed to elevate the lower extremities above the level of the heart when possible.  Active compression will be used to gain control of the patient's edema. Edema managed through use of Coban 2. I described the possible complications compression wraps and told the patient to remove them if they felt too tight or became soiled, but otherwise they will leave the wraps in place until the next visit..     Nutrition:    Nutrition status is adequate.  I made suggestions regarding appropriate protein intake, vitamin supplementation, and adequate hydration. Recommendations include healthy diet    Other comorbidities that may affect wound healing: Non-healing wounds are a symptom of their significant comorbid diseases. edema and lymphedema    Coordination of Care: I recommended none needed at this time    Patient Education:  Dressing management. S/S infection and to call clinic/ED precautions if any were to occur.   Patient verbalized understanding and agreed with POC.   Home health was not indicated at this time..    The patient will continue to be seen on a regular basis.  Follow up in clinic in 1 week and PRN interval nurse visits for dressing changes.    Roosvelt Needle, DPM  02/12/2024          [1]   Social History  Socioeconomic History    Marital status: Married   Tobacco Use    Smoking status: Heavy Smoker     Current packs/day: 1.00     Types: Cigarettes    Smokeless tobacco: Current   Substance and Sexual Activity    Alcohol use: Yes     Comment: occasionally     Drug use: Yes     Comment: marijuana     Social Drivers of Health     Food Insecurity: Unknown (11/07/2022)    Hunger Vital Sign     Worried About Running Out of Food in the Last Year: Never true    Received from [employer]    Social Network    Received from [facility]    HITS   [2] No Known Allergies  [3]   Current Outpatient Medications:     albuterol  sulfate HFA (PROVENTIL ) 108 (90 Base) MCG/ACT inhaler, Inhale 2  puffs into the lungs every 4 (four) hours as needed for Wheezing or Shortness of Breath (coughing) Dispense with spacer, Disp: 6.7 g, Rfl: 0    amlodipine-benazepril (LOTREL) 10-20 MG per capsule, Take 1 capsule by mouth daily, Disp: , Rfl:     benzonatate  (TESSALON ) 100 MG capsule, Take 1 capsule (100 mg) by mouth 3 (three) times daily as needed for Cough, Disp: 15 capsule, Rfl: 0    candesartan (ATACAND) 16 MG tablet, Take 1 tablet (16 mg) by mouth 2 (two) times daily, Disp: , Rfl:     carvedilol (COREG) 12.5 MG tablet, Take 1 tablet (12.5 mg) by mouth nightly, Disp: , Rfl:     carvedilol (COREG) 25 MG tablet, Take 1 tablet (25 mg) by mouth daily In the morning (Patient taking differently: Take 1 tablet (25 mg) by mouth once daily Patient reports  he takes 25 mg. BID), Disp: , Rfl:     cloNIDine (CATAPRES) 0.3 MG tablet, Take 1 tablet (0.3 mg) by mouth 2 (two) times daily, Disp: , Rfl:     furosemide (LASIX) 40 MG tablet, Take 1 tablet (40 mg) by mouth 2 (two) times daily, Disp: , Rfl:     hydrALAZINE (APRESOLINE) 50 MG tablet, Take 1 tablet (50 mg) by mouth 2 (two) times daily, Disp: , Rfl:     hydrochlorothiazide (HYDRODIURIL) 25 MG tablet, Take 1 tablet (25 mg) by mouth daily, Disp: , Rfl:     naproxen  (NAPROSYN ) 500 MG tablet, Take 1 tablet (500 mg) by mouth 2 (two) times daily as needed, Disp: , Rfl:     omeprazole (PRILOSEC) 40 MG capsule, Take 1 capsule (40 mg) by mouth daily, Disp: , Rfl:     potassium chloride (KLOR-CON M20) 20 MEQ CR tablet, Take 1 tablet (20 mEq) by mouth once daily, Disp: , Rfl:

## 2024-02-12 NOTE — Progress Notes (Signed)
 [facility]  [facility]:  [address] [place], [place] [address]  T [phone]  F [phone]  Wound Follow-up - Nursing Documentation    PATIENT: [name] DOB: Oct 27, 1965   MR #: [id number]  AGE: 58 y.o.    REFERRING MD:  Elisabeth Lamar DASEN, MD PRIMARY MD: Elisabeth Lamar DASEN, MD      Chief Complaint   Patient presents with    Wound Check    Edema        Assessment & Plan   Patient arrived in the Wound Center on a wheel chair. RN reconciled meds and allergies.    Case Management:  Patient identification verified using two identifiers.  This patient has been determined to be at risk for a fall, extra precautionary measures have been taken to ensure their safety.      RTC: one week.    Treatment:  LLE: Dakin's soak , gent aquacel AG, sorbex, unna boot, coban 2 in substitute for zinc Coflex . (Coflex Zinc compression Wrap out of stock)  RLE: xeroform on lateral area, Zinc coflex     Patient Education:  Elevate legs for 30 mins, 3x/day, above the level of the heart. Above the level of the heart, is key here. The easiest way to achieve this is to lay down flat and prop your legs up with at least 2 pillows.    Patient and/or Caregiver verbalized understanding of dressing changes/plan of care.    Evelyn Aguinaldo IV LULLA Ket, RN   02/12/2024

## 2024-02-19 ENCOUNTER — Ambulatory Visit: Attending: Foot & Ankle Surgery | Admitting: Foot & Ankle Surgery

## 2024-02-19 VITALS — BP 177/97 | HR 89 | Temp 97.2°F | Resp 15 | Ht 76.0 in | Wt >= 6400 oz

## 2024-02-19 DIAGNOSIS — B9561 Methicillin susceptible Staphylococcus aureus infection as the cause of diseases classified elsewhere: Secondary | ICD-10-CM | POA: Insufficient documentation

## 2024-02-19 DIAGNOSIS — L97222 Non-pressure chronic ulcer of left calf with fat layer exposed: Secondary | ICD-10-CM | POA: Insufficient documentation

## 2024-02-19 DIAGNOSIS — F17210 Nicotine dependence, cigarettes, uncomplicated: Secondary | ICD-10-CM | POA: Insufficient documentation

## 2024-02-19 DIAGNOSIS — B964 Proteus (mirabilis) (morganii) as the cause of diseases classified elsewhere: Secondary | ICD-10-CM | POA: Insufficient documentation

## 2024-02-19 DIAGNOSIS — I890 Lymphedema, not elsewhere classified: Secondary | ICD-10-CM | POA: Insufficient documentation

## 2024-02-19 DIAGNOSIS — L97219 Non-pressure chronic ulcer of right calf with unspecified severity: Secondary | ICD-10-CM

## 2024-02-19 DIAGNOSIS — R600 Localized edema: Secondary | ICD-10-CM

## 2024-02-19 NOTE — Progress Notes (Signed)
 [facility]    Wound Follow-Up - Provider Documentation    PATIENT: [name] DOB: Nov 28, 1965   MR #: [id number]  AGE: 58 y.o.    REFERRING MD:  Elisabeth Lamar DASEN, MD PRIMARY MD: Elisabeth Lamar DASEN, MD        Chief Complaint   Patient presents with    Edema    Chronic Ulcer      The patient was seen in the office today and a follow up evaluation was performed, including a history & focused exam.     [name] is a 58 year old black male who presents today with Patient reports he has had recurrent wounds and swelling of both lower legs for the past two years.  He was being treated at a wound center in [place] but moved to [place]  in July 2023.  He has been self treating with gauze/coban dressings and took a prescription for penicillin in January 2024.  PMH significant for morbid obesity, current tobacco user, hypertention, GERD and chronic back pain. Negative for diabetes.   The patient states his chief complaint is wounds of bilateral lower legs.    History of Present Illness   [name] is a 58 y.o. male who presents to [facility] wound healing center bilateral lower extremity lymphedema, edema and chronic left leg ulcers.  Patient denies fever, chills, nausea, vomiting, and leg pain.     Patient's right leg ulcer is healed however patient still have open left leg ulcer.  Patient has bilateral lower extremity lymphedema/leg edema.    Patient has been treated with Dakin's 10 minutes soaking, gentamicin, Zetuvit.  He is under multilayer compression for his leg for his leg swelling control.    Interval HPI 12/25/2023: Patient was lost in follow-up since November of last year.  Patient lost his transportation and was not able to come into wound center for follow-up until today.  Patient has been using zinc Coflex at home to control his leg swelling until he ran out of his supply.  Patient has been using Kerlix and Coban to manage his left leg  ulcer and swelling.    Interval HPI 01/01/2024: Patient was treated with Dakin's 10 minutes soaking, Aquacel Ag applied to his leg ulcer.  Patient was placed under Coflex compression on left and Coban to compression on right last week.  Patient stated that he received a phone call regarding antibiotic therapy per his leg wound infection however he has not started the medication yet and he will start today.    Interval HPI 01/08/2024: Patient was treated with Dakin's 10 minutes soaking, gentamicin, Aquacel Ag to his leg ulcer.  Patient is under Coflex compression to control his leg swelling.  Patient has been taking Levaquin  for last 2 days.  Patient is still taking Levaquin .    Interval HPI 01/22/2024: Patient was treated with Dakin's 10-minute soaking, gentamicin, Aquacel Ag to his leg ulcers.  Patient on the Coflex compression to control his leg swelling.  Patient has finished taking Levaquin .  Patient missed last week's appointment secondary to patient was unable to find parking at the hospital for over 1 hour.  Patient left due to unable to find a parking spot.    Interval HPI 01/29/2024: Patient was treated with Dakin's 10 minutes soaking, gentamicin, Aquacel Ag to his leg ulcers.  Patient is under Unna boot/Coban 2 modify compression to control his leg swelling.    Interval HPI 02/19/2024: Patient  treated with Dakin's 10 minutes soaking soaking, gentamicin, Aquacel Ag to his leg ulcers.  Patient on multilayer compression to control his leg swelling.    Past Medical History     Past Medical History:   Diagnosis Date    GERD (gastroesophageal reflux disease)     Hypertension     Lower back pain        Past Surgical History   History reviewed. No pertinent surgical history.    Family History   No family history on file.    Social History   Social History[1]    Allergies   Allergies[2]    Medications   Current Medications[3]    Physical Exam     Vitals:    02/19/24 1112   BP: (!) 177/97   Pulse: 89   Resp: 15   Temp:  97.2 F (36.2 C)   SpO2: 99%         Body mass index is 54.78 kg/m.    General:  Patient appears their stated age, well-nourished.  Alert and in no apparent distress.  Lungs: Respiratory effort unlabored, no signs of distress  Vascular: normal capillary refill  Skin: see focused wound care exam  Neuro: AAOx3 normal speech and appropriate affect, see wound care exam for sensory exam    Focused Wound Care Exam   Wound Assessment:   Wound 10/31/22 Lower Leg;Calf Left;Posterior (Active)   Date First Assessed: 10/31/22   Location: Lower Leg;Calf  Wound Location Orientation: Left;Posterior  Wound Description: lateral/posterior  Present on Original Admission: Yes      Assessments 02/19/2024 11:30 AM 02/19/2024 11:31 AM   Wound Image      Wound Base Description Granulation tissue;Full Thickness;Fibrin/slough;Scattered;Partial Thickness --   Peri-wound Description Calloused;Dry --   Shape scatter --   Wound Length (cm) 12.5 cm 12.5 cm   Wound Width (cm) 9.5 cm 9.5 cm   Wound Depth (cm) 0.2 cm 0.4 cm   Wound Surface Area (cm^2) 93.27 cm^2 93.27 cm^2   Wound Volume (cm^3) 12.435 cm^3 24.871 cm^3   Closure Open --   Drainage Amount Moderate --   Drainage Description Yellow;Tan/Brown --   Margins Undefined edges --   Treatments Cleansed --   Topical Dakins Solution --   Dressing Silver Impregnated Textile --       Active Orders   Date Order Priority Status Authorizing Provider   02/19/24 1143 Debridement Routine Active Orlan Munch, DPM       Inactive Orders   Date Order Priority Status Authorizing Provider   02/12/24 1142 DEBRIDEMENT Lower Leg;Calf Left;Posterior lateral/posterior Routine Completed Orlan Munch, [facility]   01/29/24 1212 Additional Wound Procedures Routine Completed Orlan Munch, DPM   01/29/24 1152 DEBRIDEMENT Lower Leg;Calf Left;Posterior lateral/posterior Routine Completed Orlan Munch, DPM   01/22/24 1124 DEBRIDEMENT Lower Leg;Calf Left;Posterior lateral/posterior Routine Completed Orlan Munch, [facility]    01/08/24 1232 Additional Wound Procedures Routine Completed Orlan Munch, DPM   01/08/24 1208 DEBRIDEMENT Lower Leg;Calf Left;Posterior lateral/posterior Routine Completed Orlan Munch, DPM   01/01/24 1157 Additional Wound Procedures Routine Completed Orlan Munch, DPM   01/01/24 1157 DEBRIDEMENT Lower Leg;Calf Left;Lateral lateral/posterior Routine Completed Orlan Munch, DPM   12/25/23 1206 DEBRIDEMENT Lower Leg;Calf Left;Lateral Routine Completed Orlan Munch, DPM   08/14/23 1200 DEBRIDEMENT Lower Leg;Calf Left;Lateral Routine Completed Orlan Munch, DPM   07/03/23 1140 DEBRIDEMENT Lower Leg;Calf Left;Lateral Routine Completed Orlan Munch, DPM   06/19/23 1209 DEBRIDEMENT Lower Leg;Calf Left;Lateral Routine Completed Orlan Munch, DPM   06/05/23 1205 DEBRIDEMENT  Lower Leg;Calf Left;Lateral Routine Completed Orlan Munch, DPM   05/22/23 1229 DEBRIDEMENT Lower Leg;Calf Left;Lateral Routine Completed Orlan Munch, DPM   05/08/23 1202 DEBRIDEMENT Lower Leg;Calf Left;Lateral Routine Completed Orlan Munch, DPM       Wound 10/31/20 Edema/Edema Management Lower Leg Anterior;Left (Active)   Date First Assessed/Time First Assessed: 10/31/20 0800   Primary Wound Type: Edema/Edema Management  Edema extremity: LLE  Edema Swelling: Pitting  Location: Lower Leg  Wound Location Orientation: Anterior;Left      Assessments 02/19/2024 11:30 AM   Wound Image     Treatments Cleansed   Compression Wrap 1 Coban 2 or equivalent   Topical Other (Comment)       Active Orders   Date Order Priority Status Authorizing Provider   02/19/24 1157 Additional Wound Procedures Routine Active Orlan Munch, DPM       Inactive Orders   Date Order Priority Status Authorizing Provider   01/29/24 1212 Additional Wound Procedures Routine Completed Orlan Munch, DPM   01/22/24 1154 Additional Wound Procedures Routine Completed Orlan Munch, DPM   01/08/24 1232 Additional Wound Procedures Routine Completed Orlan Munch, DPM    01/01/24 1157 Additional Wound Procedures Routine Completed Orlan Munch, DPM   08/14/23 1203 Additional Wound Procedures Routine Completed Orlan Munch, DPM   06/05/23 1215 Additional Wound Procedures Routine Completed Orlan Munch, DPM   05/22/23 1221 Additional Wound Procedures Routine Completed Orlan Munch, DPM   05/08/23 1230 Additional Wound Procedures Routine Completed Orlan Munch, DPM       Wound 10/31/20 Edema/Edema Management Lower Leg Anterior;Right (Active)   Date First Assessed/Time First Assessed: 10/31/20 0800   Primary Wound Type: Edema/Edema Management  Edema extremity: RLE  Edema Swelling: Pitting  Location: Lower Leg  Wound Location Orientation: Anterior;Right      Assessments 02/19/2024 11:32 AM   Wound Image     Treatments Cleansed   Compression Wrap 1 Coban 2 or equivalent   Topical Other (Comment)       Active Orders   Date Order Priority Status Authorizing Provider   02/19/24 1157 Additional Wound Procedures Routine Active Orlan Munch, DPM       Inactive Orders   Date Order Priority Status Authorizing Provider   01/22/24 1154 Additional Wound Procedures Routine Completed Orlan Munch, DPM   01/08/24 1232 Additional Wound Procedures Routine Completed Orlan Munch, DPM   01/01/24 1157 Additional Wound Procedures Routine Completed Orlan Munch, DPM   08/14/23 1203 Additional Wound Procedures Routine Completed Orlan Munch, DPM   06/05/23 1215 Additional Wound Procedures Routine Completed Orlan Munch, DPM   05/22/23 1221 Additional Wound Procedures Routine Completed Orlan Munch, DPM   05/08/23 1230 Additional Wound Procedures Routine Completed Orlan Munch, DPM           Edema  RLE Edema: Pitting; 2+ indentation < 5mm  R Foot Measurements(cm): 26.5 cm  R Ankle Measurements(cm): 26 cm  R Calf Measurements(cm): 51 cm  RLE Treatment: RLE Multilayer  RLE Multilayer Tx: Coban 2 (unna boot under)  LLE Edema: Pitting; 2+ indentation < 5mm  L Foot Measurements(cm): 25.5  cm  L Ankle Measurements(cm): 26.5 cm  L Calf Measurements(cm): 51 cm  LLE Treatment: LLE Multilayer  LLE Multilayer Tx: Coban 2 (unna boot under)            Procedure Note   I decided to perform debridement today because, based on the patient's current status, there is an expectation that serial debridement will improve healing potential, reduce or control tissue infection, and  prepare the tissue for surgical management. Surgical debridement is required to excise a specific, targeted area of devitalized or necrotic tissue along the margin of viable tissue by sharp dissection. I have reviewed the individualized treatment plan for this patient who requires a debridement. Debridement is needed as part of the plan to control heavy wound colonization. I previously assessed the patient's vascular status which is adequate. I have ensured that this ulcer has been adequately off-loaded. I am continuing to address nutritional issues, monitoring weights, encouraging the use of supplements and drawing laboratory tests when appropriate.    DEBRIDEMENT Lower Leg;Calf Left;Posterior lateral/posterior    Performed by: Orlan Munch, DPM  Authorized by: Orlan Munch, DPM  Associated wounds:   Wound 10/31/22 Lower Leg;Calf Left;Posterior      Consent:     Consent obtained:  Verbal and written    Consent given by:  Patient    Risks discussed: Yes      Time out: Immediately prior to the procedure a time out was called    Time out performed at:  02/19/2024 11:43 AM    Debridement Details:     Performed by:  Physician    Type: excisional      Level: subcutaneous tissue      Pain control:  Lidocaine 2%    Pain control administration: topical        Time taken:  02/19/2024 11:30 AM  Pre-debridement measurements    Length (cm):  12.5    Width (cm):  9.5    Depth (cm):  0.2    Surface Area (cm^2):  93.27    Time taken:  02/19/2024 11:31 AM    Post-debridement measurements    Length (cm):  12.5    Width (cm):  9.5    Depth (cm):  0.4    Percent  Debrided (%):  50    Surface Area (cm^2):  118.75    Area Debrided (cm^2):  59.38    Volume (cm^3):  47.5    Devitalized tissue debrided: biofilm, fibrin and necrotic debris      Instrument:  Curette and scalpel #10    Amount of bleeding: small    Specimen sent for culture:   Specimen sent for culture comment:  No  Specimen sent for pathology:   Specimen sent for pathology comment:  No  Culture obtained:  no  Culture obtained comment:  Culture not obtained.    Hemostasis obtained with:  Pressure    Procedural pain:  0    Post-procedural pain:  0    Response to treatment:  Procedure was tolerated well   The wound bed is comprised of <25% beefy red granulation tissue and >50% necrotic tissue nonviable tissue.         Assessment & Plan   I examined the patient for an wound(s) that has been resistant to healing despite numerous interventions. The plan is to now examine any underlying characteristics that may impede healing. I decided that the wounds are not self limited and significantly impact the patient's daily living.    Wound Assessment and Plan:  The wounds are assessed as followed: stable.    Wound culture result reviewed-moderate growth of Proteus Mirabella's, light growth of Staphylococcus aureus.  Patient's wound culture was reviewed earlier and Rx Levaquin  500 daily was called in on Monday but patient has just picked up medicine this morning, he will start taking medicine starting today.    After evaluation of the patient and ulcer/wound characteristics, I  decided that debridement was indicated today.    Wound Care Plan:  Advanced Wound dressings appropriate to the needs of the wound have been initiated to provide a microenvironment which can enhance healing including maintaining a moist environment, decreasing bioburden and insulating and protecting the wound.     Patient was treated with Dakin's 10 minutes soaking, Acticoat 7 applied  Patient was placed under the multilayer compressions to control his leg  swelling.  Unna boot/Coban 2 modified compression applied    Vascular Status:   The patient's vascular status was assessed and it was determined that their vascular status is adequate for healing.     Edema Management:    The patient has edema to Bilateral lower extremities.  I reviewed the leg, ankle and foot circumference measurements to assess the patient's response to compression. The edema is +3 pitting edema. Edema control is imperative for healing.  Patient instructed to elevate the lower extremities above the level of the heart when possible.  Active compression will be used to gain control of the patient's edema. Edema managed through use of Coban 2. I described the possible complications compression wraps and told the patient to remove them if they felt too tight or became soiled, but otherwise they will leave the wraps in place until the next visit..     Nutrition:    Nutrition status is adequate.  I made suggestions regarding appropriate protein intake, vitamin supplementation, and adequate hydration. Recommendations include healthy diet    Other comorbidities that may affect wound healing: Non-healing wounds are a symptom of their significant comorbid diseases. edema and lymphedema    Coordination of Care: I recommended none needed at this time    Patient Education:  Dressing management. S/S infection and to call clinic/ED precautions if any were to occur.   Patient verbalized understanding and agreed with POC.   Home health was not indicated at this time..    The patient will continue to be seen on a regular basis.  Follow up in clinic in 1 week and PRN interval nurse visits for dressing changes.    [name], DPM  02/19/2024          [1]   Social History  Socioeconomic History    Marital status: Married   Tobacco Use    Smoking status: Heavy Smoker     Current packs/day: 1.00     Types: Cigarettes    Smokeless tobacco: Current   Substance and Sexual Activity    Alcohol use: Yes     Comment:  occasionally     Drug use: Yes     Comment: marijuana     Social Drivers of Health     Food Insecurity: Unknown (11/07/2022)    Hunger Vital Sign     Worried About Running Out of Food in the Last Year: Never true    Received from [employer]    Social Network    Received from [facility]    HITS   [2] No Known Allergies  [3]   Current Outpatient Medications:     albuterol  sulfate HFA (PROVENTIL ) 108 (90 Base) MCG/ACT inhaler, Inhale 2 puffs into the lungs every 4 (four) hours as needed for Wheezing or Shortness of Breath (coughing) Dispense with spacer, Disp: 6.7 g, Rfl: 0    amlodipine-benazepril (LOTREL) 10-20 MG per capsule, Take 1 capsule by mouth daily, Disp: , Rfl:     benzonatate  (TESSALON ) 100 MG capsule, Take 1 capsule (100 mg) by mouth  3 (three) times daily as needed for Cough, Disp: 15 capsule, Rfl: 0    candesartan (ATACAND) 16 MG tablet, Take 1 tablet (16 mg) by mouth 2 (two) times daily, Disp: , Rfl:     carvedilol (COREG) 12.5 MG tablet, Take 1 tablet (12.5 mg) by mouth nightly, Disp: , Rfl:     cloNIDine (CATAPRES) 0.3 MG tablet, Take 1 tablet (0.3 mg) by mouth 2 (two) times daily, Disp: , Rfl:     furosemide (LASIX) 40 MG tablet, Take 1 tablet (40 mg) by mouth 2 (two) times daily, Disp: , Rfl:     hydrALAZINE (APRESOLINE) 50 MG tablet, Take 1 tablet (50 mg) by mouth 2 (two) times daily, Disp: , Rfl:     hydrochlorothiazide (HYDRODIURIL) 25 MG tablet, Take 1 tablet (25 mg) by mouth daily, Disp: , Rfl:     naproxen  (NAPROSYN ) 500 MG tablet, Take 1 tablet (500 mg) by mouth 2 (two) times daily as needed, Disp: , Rfl:     omeprazole (PRILOSEC) 40 MG capsule, Take 1 capsule (40 mg) by mouth daily, Disp: , Rfl:     potassium chloride (KLOR-CON M20) 20 MEQ CR tablet, Take 1 tablet (20 mEq) by mouth once daily, Disp: , Rfl:     carvedilol (COREG) 25 MG tablet, Take 1 tablet (25 mg) by mouth daily In the morning (Patient not taking: Reported on 02/19/2024), Disp: , Rfl:

## 2024-02-19 NOTE — Progress Notes (Signed)
 [facility]ulatory [facility]  [facility]:  [address] [place], [place] [address]  T [phone]  F [phone]  Wound Follow-up - Nursing Documentation    PATIENT: [name] DOB: September 27, 1965   MR #: [id number]  AGE: 58 y.o.    REFERRING MD:  Elisabeth Lamar DASEN, MD PRIMARY MD: Elisabeth Lamar DASEN, MD      Chief Complaint   Patient presents with    Edema    Chronic Ulcer        Assessment & Plan     Case Management:  Patient identification verified using two identifiers.  This patient has been determined to be at risk for a fall, extra precautionary measures have been taken to ensure their safety.    Pt reports that he forgot to take BP medication last night. His BP continue to be high in clinic however patient denies symptoms.    Treatment:  LLE: Dakin's soak , acticoat 7, urea cream to intact skin, unna boot, coban 2  RLE: urea cream to intact skin, unna boot, coban 2  No changes until next clinic visit  RTC: 1 wk     Procedures:  Additional Wound Procedures    Performed by: Jackson Aquas, RN  Authorized by: Orlan Munch, DPM  Associated wounds:   Wound 10/31/20 Edema/Edema Management Lower Leg Anterior;Left  Wound 10/31/20 Edema/Edema Management Lower Leg Anterior;Right    Body area:  Lower extremity  Lower extremity location:  R lower leg and L lower leg    Compression wrap type: multi-layer    Wrap location: bilateral    Wrap location equal debrided location?: yes    Debrided location: bilateral    Multi-layer: coban 2     Procedure was performed in a clean field. The leg was washed with soap and water and then patted dry. Moisturizer was applied to the leg. A multi-layer compression bandage was applied per manufacturer's instructions. Patient tolerated procedure well and without complaints of pain or discomfort. Patient was educated regarding signs and symptoms of overcompression, including unrelieved pain, toes turning red or purple, or numbness in foot.  Patient was  instructed to manually remove outer layer (without scissors) and contact clinic immediately.       Patient Education:  Elevate legs for 30 mins, 3x/day, above the level of the heart. Above the level of the heart, is key here. The easiest way to achieve this is to lay down flat and prop your legs up with at least 2 pillows.  Patient and/or Caregiver verbalized understanding of dressing changes/plan of care.  Aquas Jackson, RN   02/19/2024

## 2024-02-26 ENCOUNTER — Ambulatory Visit

## 2024-03-04 ENCOUNTER — Ambulatory Visit: Attending: Foot & Ankle Surgery | Admitting: Foot & Ankle Surgery

## 2024-03-04 VITALS — BP 153/83 | HR 66 | Temp 96.7°F

## 2024-03-04 DIAGNOSIS — R600 Localized edema: Secondary | ICD-10-CM

## 2024-03-04 DIAGNOSIS — I890 Lymphedema, not elsewhere classified: Secondary | ICD-10-CM | POA: Insufficient documentation

## 2024-03-04 DIAGNOSIS — F17210 Nicotine dependence, cigarettes, uncomplicated: Secondary | ICD-10-CM | POA: Insufficient documentation

## 2024-03-04 DIAGNOSIS — L97222 Non-pressure chronic ulcer of left calf with fat layer exposed: Secondary | ICD-10-CM | POA: Insufficient documentation

## 2024-03-04 NOTE — Progress Notes (Signed)
 Ambulatory [facility]  [facility]:  [address], [place], [place] [address]  T [phone]  F [phone]  Wound Follow-up - Nursing Documentation    PATIENT: [name] DOB: 06-May-1966   MR #: [id number]  AGE: 59 y.o.    REFERRING MD:  Elisabeth Lamar DASEN, MD PRIMARY MD: Elisabeth Lamar DASEN, MD      Chief Complaint   Patient presents with    Wound Check    Edema        Assessment & Plan     Case Management:  Patient identification verified using two identifiers.  This patient has been determined to be at risk for a fall, extra precautionary measures have been taken to ensure their safety.    Pt arrived by transport WC.    Treatment:  LLE: Dakin's soak , acticoat 7, urea cream to intact skin, unna boot, coban 2  RLE: urea cream to intact skin, unna boot, coban 2  No changes until next clinic visit  RTC: 1 wk     Procedures:  Additional Wound Procedures    Performed by: Celinda Arabia, RN  Authorized by: Orlan Munch, DPM  Associated wounds:   Wound 10/31/20 Edema/Edema Management Lower Leg Anterior;Left  Wound 10/31/20 Edema/Edema Management Lower Leg Anterior;Right    Body area:  Lower extremity  Lower extremity location:  R lower leg and L lower leg    Compression wrap type: multi-layer    Wrap location: bilateral    Wrap location equal debrided location?: yes    Debrided location: bilateral    Multi-layer: 2 layer     Procedure was performed in a clean field. The leg was washed with soap and water and then patted dry. Moisturizer was applied to the leg. A multi-layer compression bandage was applied per manufacturer's instructions. Patient tolerated procedure well and without complaints of pain or discomfort. Patient was educated regarding signs and symptoms of overcompression, including unrelieved pain, toes turning red or purple, or numbness in foot.  Patient was instructed to manually remove outer layer (without scissors) and contact clinic immediately.       Patient  Education:  - Elevate legs for 30 mins, 3x/day, above the level of the heart. Above the level of the heart, is key here. The easiest way to achieve this is to lay down flat and prop your legs up with at least 2 pillows.  - Patient and/or Caregiver verbalized understanding of dressing changes/plan of care.    Arabia Celinda, RN   03/04/2024

## 2024-03-04 NOTE — Progress Notes (Signed)
 [facility]    Wound Follow-Up - Provider Documentation    PATIENT: [name] DOB: Jul 26, 1966   MR #: [id number]  AGE: 58 y.o.    REFERRING MD:  Elisabeth Lamar DASEN, MD PRIMARY MD: Elisabeth Lamar DASEN, MD        Chief Complaint   Patient presents with    Wound Check    Edema      The patient was seen in the office today and a follow up evaluation was performed, including a history & focused exam.     [name] is a 58 year old black male who presents today with Patient reports he has had recurrent wounds and swelling of both lower legs for the past two years.  He was being treated at a wound center in [place] but moved to [place]  in July 2023.  He has been self treating with gauze/coban dressings and took a prescription for penicillin in January 2024.  PMH significant for morbid obesity, current tobacco user, hypertention, GERD and chronic back pain. Negative for diabetes.   The patient states his chief complaint is wounds of bilateral lower legs.    History of Present Illness   [name] is a 58 y.o. male who presents to [facility] wound healing center bilateral lower extremity lymphedema, edema and chronic left leg ulcers.  Patient denies fever, chills, nausea, vomiting, and leg pain.     Patient's right leg ulcer is healed however patient still have open left leg ulcer.  Patient has bilateral lower extremity lymphedema/leg edema.    Patient has been treated with Dakin's 10 minutes soaking, gentamicin, Zetuvit.  He is under multilayer compression for his leg for his leg swelling control.    Interval HPI 12/25/2023: Patient was lost in follow-up since November of last year.  Patient lost his transportation and was not able to come into wound center for follow-up until today.  Patient has been using zinc Coflex at home to control his leg swelling until he ran out of his supply.  Patient has been using Kerlix and Coban to manage his left leg  ulcer and swelling.    Interval HPI 01/01/2024: Patient was treated with Dakin's 10 minutes soaking, Aquacel Ag applied to his leg ulcer.  Patient was placed under Coflex compression on left and Coban to compression on right last week.  Patient stated that he received a phone call regarding antibiotic therapy per his leg wound infection however he has not started the medication yet and he will start today.    Interval HPI 01/08/2024: Patient was treated with Dakin's 10 minutes soaking, gentamicin, Aquacel Ag to his leg ulcer.  Patient is under Coflex compression to control his leg swelling.  Patient has been taking Levaquin  for last 2 days.  Patient is still taking Levaquin .    Interval HPI 01/22/2024: Patient was treated with Dakin's 10-minute soaking, gentamicin, Aquacel Ag to his leg ulcers.  Patient on the Coflex compression to control his leg swelling.  Patient has finished taking Levaquin .  Patient missed last week's appointment secondary to patient was unable to find parking at the hospital for over 1 hour.  Patient left due to unable to find a parking spot.    Interval HPI 01/29/2024: Patient was treated with Dakin's 10 minutes soaking, gentamicin, Aquacel Ag to his leg ulcers.  Patient is under Unna boot/Coban 2 modify compression to control his leg swelling.    Interval HPI 02/19/2024: Patient  treated with Dakin's 10 minutes soaking soaking, gentamicin, Aquacel Ag to his leg ulcers.  Patient on multilayer compression to control his leg swelling.    Interval HPI 03/04/2024: Patient was treated with Dakin's 10 minutes soaking, Acticoat 7 to his left leg ulcers.  Patient is under Radio broadcast assistant multilayer compression to control his leg swelling.    Past Medical History     Past Medical History:   Diagnosis Date    GERD (gastroesophageal reflux disease)     Hypertension     Lower back pain        Past Surgical History   History reviewed. No pertinent surgical history.    Family History   No family history on  file.    Social History   Social History[1]    Allergies   Allergies[2]    Medications   Current Medications[3]    Physical Exam     Vitals:    03/04/24 1135   BP: 153/83   Pulse: 66   Temp: (!) 96.7 F (35.9 C)         There is no height or weight on file to calculate BMI.    General:  Patient appears their stated age, well-nourished.  Alert and in no apparent distress.  Lungs: Respiratory effort unlabored, no signs of distress  Vascular: normal capillary refill  Skin: see focused wound care exam  Neuro: AAOx3 normal speech and appropriate affect, see wound care exam for sensory exam    Focused Wound Care Exam   Wound Assessment:   Wound 10/31/22 Lower Leg;Calf Left;Posterior (Active)   Date First Assessed: 10/31/22   Location: Lower Leg;Calf  Wound Location Orientation: Left;Posterior  Wound Description: lateral/posterior  Present on Original Admission: Yes      Assessments 03/04/2024 11:49 AM 03/04/2024 11:50 AM   Wound Image      Wound Base Description Partial Thickness;Red;Moist --   Peri-wound Description Erythema --   Wound Length (cm) 15 cm 15 cm   Wound Width (cm) 20 cm 20 cm   Wound Depth (cm) 0.1 cm 0.4 cm   Wound Surface Area (cm^2) 235.62 cm^2 235.62 cm^2   Wound Volume (cm^3) 15.708 cm^3 62.832 cm^3   Closure Open --   Drainage Amount Small --   Drainage Description Serosanguinous --   Tunneling No --   Undermining  No --   Margins Defined edges --   Treatments Cleansed;Wound Cleanser --   Dressing Silver (Ag) Dressing --       Active Orders   Date Order Priority Status Authorizing Provider   03/04/24 1204 DEBRIDEMENT Lower Leg;Calf Left;Posterior lateral/posterior Routine Active Orlan Munch, DPM       Inactive Orders   Date Order Priority Status Authorizing Provider   02/19/24 1143 DEBRIDEMENT Lower Leg;Calf Left;Posterior lateral/posterior Routine Completed Orlan Munch, DPM   02/12/24 1142 DEBRIDEMENT Lower Leg;Calf Left;Posterior lateral/posterior Routine Completed Orlan Munch, [facility]    01/29/24 1212 Additional Wound Procedures Routine Completed Orlan Munch, DPM   01/29/24 1152 DEBRIDEMENT Lower Leg;Calf Left;Posterior lateral/posterior Routine Completed Orlan Munch, DPM   01/22/24 1124 DEBRIDEMENT Lower Leg;Calf Left;Posterior lateral/posterior Routine Completed Orlan Munch, [facility]   01/08/24 1232 Additional Wound Procedures Routine Completed Orlan Munch, DPM   01/08/24 1208 DEBRIDEMENT Lower Leg;Calf Left;Posterior lateral/posterior Routine Completed Orlan Munch, DPM   01/01/24 1157 Additional Wound Procedures Routine Completed Orlan Munch, DPM   01/01/24 1157 DEBRIDEMENT Lower Leg;Calf Left;Lateral lateral/posterior Routine Completed Orlan Munch, DPM   12/25/23 1206 DEBRIDEMENT Lower Leg;Calf Left;Lateral Routine  Completed Orlan Munch, DPM   08/14/23 1200 DEBRIDEMENT Lower Leg;Calf Left;Lateral Routine Completed Orlan Munch, DPM   07/03/23 1140 DEBRIDEMENT Lower Leg;Calf Left;Lateral Routine Completed Orlan Munch, DPM   06/19/23 1209 DEBRIDEMENT Lower Leg;Calf Left;Lateral Routine Completed Orlan Munch, DPM   06/05/23 1205 DEBRIDEMENT Lower Leg;Calf Left;Lateral Routine Completed Orlan Munch, DPM   05/22/23 1229 DEBRIDEMENT Lower Leg;Calf Left;Lateral Routine Completed Orlan Munch, DPM   05/08/23 1202 DEBRIDEMENT Lower Leg;Calf Left;Lateral Routine Completed Orlan Munch, DPM       Wound 10/31/20 Edema/Edema Management Lower Leg Anterior;Left (Active)   Date First Assessed/Time First Assessed: 10/31/20 0800   Primary Wound Type: Edema/Edema Management  Edema extremity: LLE  Edema Swelling: Pitting  Location: Lower Leg  Wound Location Orientation: Anterior;Left      Assessments 03/04/2024 11:48 AM   Wound Image     Dressing Other (Comment)       Active Orders   Date Order Priority Status Authorizing Provider   03/04/24 1235 Additional Wound Procedures Routine Active Orlan Munch, DPM       Inactive Orders   Date Order Priority Status Authorizing Provider    02/19/24 1157 Additional Wound Procedures Routine Completed Orlan Munch, [facility]   01/29/24 1212 Additional Wound Procedures Routine Completed Orlan Munch, DPM   01/22/24 1154 Additional Wound Procedures Routine Completed Orlan Munch, DPM   01/08/24 1232 Additional Wound Procedures Routine Completed Orlan Munch, DPM   01/01/24 1157 Additional Wound Procedures Routine Completed Orlan Munch, DPM   08/14/23 1203 Additional Wound Procedures Routine Completed Orlan Munch, DPM   06/05/23 1215 Additional Wound Procedures Routine Completed Orlan Munch, DPM   05/22/23 1221 Additional Wound Procedures Routine Completed Orlan Munch, DPM   05/08/23 1230 Additional Wound Procedures Routine Completed Orlan Munch, DPM       Wound 10/31/20 Edema/Edema Management Lower Leg Anterior;Right (Active)   Date First Assessed/Time First Assessed: 10/31/20 0800   Primary Wound Type: Edema/Edema Management  Edema extremity: RLE  Edema Swelling: Pitting  Location: Lower Leg  Wound Location Orientation: Anterior;Right      Assessments 03/04/2024 11:48 AM 03/04/2024 11:49 AM   Wound Image      Dressing -- Other (Comment)       Active Orders   Date Order Priority Status Authorizing Provider   03/04/24 1235 Additional Wound Procedures Routine Active Orlan Munch, DPM       Inactive Orders   Date Order Priority Status Authorizing Provider   02/19/24 1157 Additional Wound Procedures Routine Completed Orlan Munch, DPM   01/22/24 1154 Additional Wound Procedures Routine Completed Orlan Munch, DPM   01/08/24 1232 Additional Wound Procedures Routine Completed Orlan Munch, DPM   01/01/24 1157 Additional Wound Procedures Routine Completed Orlan Munch, DPM   08/14/23 1203 Additional Wound Procedures Routine Completed Orlan Munch, DPM   06/05/23 1215 Additional Wound Procedures Routine Completed Orlan Munch, DPM   05/22/23 1221 Additional Wound Procedures Routine Completed Orlan Munch, DPM   05/08/23 1230  Additional Wound Procedures Routine Completed Orlan Munch, DPM     Edema  RLE Edema: Pitting; 2+ indentation < 5mm  R Foot Measurements(cm): 27 cm  R Ankle Measurements(cm): 28 cm  R Calf Measurements(cm): 50 cm  RLE Treatment: RLE Multilayer  RLE Multilayer Tx: Coban 2  LLE Edema: Pitting; 2+ indentation < 5mm  L Foot Measurements(cm): 27 cm  L Ankle Measurements(cm): 29 cm  L Calf Measurements(cm): 49 cm  LLE Treatment: LLE Multilayer  LLE Multilayer Tx: Coban 2  Procedure Note   I decided to perform debridement today because, based on the patient's current status, there is an expectation that serial debridement will improve healing potential, reduce or control tissue infection, and prepare the tissue for surgical management. Surgical debridement is required to excise a specific, targeted area of devitalized or necrotic tissue along the margin of viable tissue by sharp dissection. I have reviewed the individualized treatment plan for this patient who requires a debridement. Debridement is needed as part of the plan to control heavy wound colonization. I previously assessed the patient's vascular status which is adequate. I have ensured that this ulcer has been adequately off-loaded. I am continuing to address nutritional issues, monitoring weights, encouraging the use of supplements and drawing laboratory tests when appropriate.    DEBRIDEMENT Lower Leg;Calf Left;Posterior lateral/posterior    Performed by: Orlan Munch, DPM  Authorized by: Orlan Munch, DPM  Associated wounds:   Wound 10/31/22 Lower Leg;Calf Left;Posterior      Consent:     Consent obtained:  Verbal and written    Consent given by:  Patient    Risks discussed: Yes      Time out: Immediately prior to the procedure a time out was called    Time out performed at:  03/04/2024 12:04 PM    Debridement Details:     Performed by:  Physician    Type: excisional      Level: subcutaneous tissue      Pain control:  Lidocaine 2%    Pain control  administration: topical        Time taken:  03/04/2024 11:49 AM  Pre-debridement measurements    Length (cm):  15    Width (cm):  20    Depth (cm):  0.1    Surface Area (cm^2):  300    Time taken:  03/04/2024 11:50 AM    Post-debridement measurements    Length (cm):  15    Width (cm):  20    Depth (cm):  0.4    Percent Debrided (%):  20    Surface Area (cm^2):  300    Area Debrided (cm^2):  60    Volume (cm^3):  120    Devitalized tissue debrided: biofilm, callus, fibrin and necrotic debris      Instrument:  Blade, curette and nippers    Amount of bleeding: small    Specimen sent for culture:   Specimen sent for culture comment:  No  Specimen sent for pathology:   Specimen sent for pathology comment:  No  Culture obtained:  no  Culture obtained comment:  Culture not obtained.    Hemostasis obtained with:  Pressure    Procedural pain:  0    Post-procedural pain:  0    Response to treatment:  Procedure was tolerated well   The wound bed is comprised of <25% beefy red granulation tissue and >50% necrotic tissue nonviable tissue.         Assessment & Plan   I examined the patient for an wound(s) that has been resistant to healing despite numerous interventions. The plan is to now examine any underlying characteristics that may impede healing. I decided that the wounds are not self limited and significantly impact the patient's daily living.    Wound Assessment and Plan:  The wounds are assessed as followed: stable.    No sign of clinical infection.    After evaluation of the patient and ulcer/wound characteristics, I decided that debridement was indicated today.  Wound Care Plan:  Advanced Wound dressings appropriate to the needs of the wound have been initiated to provide a microenvironment which can enhance healing including maintaining a moist environment, decreasing bioburden and insulating and protecting the wound.     Patient was treated with Dakin's 10 minutes soaking, Acticoat 7 applied  Patient was placed under  the multilayer compressions to control his leg swelling.  Unna boot/Coban 2 modified compression applied    Vascular Status:   The patient's vascular status was assessed and it was determined that their vascular status is adequate for healing.     Edema Management:    The patient has edema to Bilateral lower extremities.  I reviewed the leg, ankle and foot circumference measurements to assess the patient's response to compression. The edema is +3 pitting edema. Edema control is imperative for healing.  Patient instructed to elevate the lower extremities above the level of the heart when possible.  Active compression will be used to gain control of the patient's edema. Edema managed through use of Coban 2. I described the possible complications compression wraps and told the patient to remove them if they felt too tight or became soiled, but otherwise they will leave the wraps in place until the next visit..     Nutrition:    Nutrition status is adequate.  I made suggestions regarding appropriate protein intake, vitamin supplementation, and adequate hydration. Recommendations include healthy diet    Other comorbidities that may affect wound healing: Non-healing wounds are a symptom of their significant comorbid diseases. edema and lymphedema    Coordination of Care: I recommended none needed at this time    Patient Education:  Dressing management. S/S infection and to call clinic/ED precautions if any were to occur.   Patient verbalized understanding and agreed with POC.   Home health was not indicated at this time..    The patient will continue to be seen on a regular basis.  Follow up in clinic in 1 week and PRN interval nurse visits for dressing changes.    Roosvelt Needle, DPM  03/04/2024          [1]   Social History  Socioeconomic History    Marital status: Married   Tobacco Use    Smoking status: Heavy Smoker     Current packs/day: 1.00     Types: Cigarettes    Smokeless tobacco: Current   Substance and Sexual  Activity    Alcohol use: Yes     Comment: occasionally     Drug use: Yes     Comment: marijuana     Social Drivers of Health     Food Insecurity: Unknown (11/07/2022)    Hunger Vital Sign     Worried About Running Out of Food in the Last Year: Never true    Received from [employer]    Social Network    Received from [facility]    HITS   [2] No Known Allergies  [3]   Current Outpatient Medications:     albuterol  sulfate HFA (PROVENTIL ) 108 (90 Base) MCG/ACT inhaler, Inhale 2 puffs into the lungs every 4 (four) hours as needed for Wheezing or Shortness of Breath (coughing) Dispense with spacer, Disp: 6.7 g, Rfl: 0    amlodipine-benazepril (LOTREL) 10-20 MG per capsule, Take 1 capsule by mouth daily, Disp: , Rfl:     benzonatate  (TESSALON ) 100 MG capsule, Take 1 capsule (100 mg) by mouth 3 (three) times daily as needed for Cough, Disp:  15 capsule, Rfl: 0    candesartan (ATACAND) 16 MG tablet, Take 1 tablet (16 mg) by mouth 2 (two) times daily, Disp: , Rfl:     carvedilol (COREG) 12.5 MG tablet, Take 1 tablet (12.5 mg) by mouth nightly, Disp: , Rfl:     carvedilol (COREG) 25 MG tablet, Take 1 tablet (25 mg) by mouth daily In the morning (Patient not taking: Reported on 02/19/2024), Disp: , Rfl:     cloNIDine (CATAPRES) 0.3 MG tablet, Take 1 tablet (0.3 mg) by mouth 2 (two) times daily, Disp: , Rfl:     furosemide (LASIX) 40 MG tablet, Take 1 tablet (40 mg) by mouth 2 (two) times daily, Disp: , Rfl:     hydrALAZINE (APRESOLINE) 50 MG tablet, Take 1 tablet (50 mg) by mouth 2 (two) times daily, Disp: , Rfl:     hydrochlorothiazide (HYDRODIURIL) 25 MG tablet, Take 1 tablet (25 mg) by mouth daily, Disp: , Rfl:     naproxen  (NAPROSYN ) 500 MG tablet, Take 1 tablet (500 mg) by mouth 2 (two) times daily as needed, Disp: , Rfl:     omeprazole (PRILOSEC) 40 MG capsule, Take 1 capsule (40 mg) by mouth daily, Disp: , Rfl:     potassium chloride (KLOR-CON M20) 20 MEQ CR tablet, Take 1 tablet (20 mEq) by mouth once daily, Disp: ,  Rfl:

## 2024-03-11 ENCOUNTER — Ambulatory Visit: Admitting: Foot & Ankle Surgery

## 2024-03-11 VITALS — BP 122/79 | HR 71 | Temp 97.5°F | Resp 18 | Wt >= 6400 oz

## 2024-03-11 DIAGNOSIS — I890 Lymphedema, not elsewhere classified: Secondary | ICD-10-CM | POA: Insufficient documentation

## 2024-03-11 DIAGNOSIS — L97222 Non-pressure chronic ulcer of left calf with fat layer exposed: Secondary | ICD-10-CM | POA: Insufficient documentation

## 2024-03-11 DIAGNOSIS — F17210 Nicotine dependence, cigarettes, uncomplicated: Secondary | ICD-10-CM | POA: Insufficient documentation

## 2024-03-11 DIAGNOSIS — I10 Essential (primary) hypertension: Secondary | ICD-10-CM | POA: Insufficient documentation

## 2024-03-11 DIAGNOSIS — R600 Localized edema: Secondary | ICD-10-CM

## 2024-03-11 NOTE — Progress Notes (Signed)
 [facility]  [facility]:  [address], [place], [place] [address]  T [phone]  F [phone]  Wound Follow-up - Nursing Documentation    PATIENT: [name] DOB: 1966-09-04   MR #: [id number]  AGE: 58 y.o.    REFERRING MD:  Elisabeth Lamar DASEN, MD PRIMARY MD: Elisabeth Lamar DASEN, MD      Chief Complaint   Patient presents with    Wound Check        Assessment & Plan     Case Management:  Patient identification verified using two identifiers.  RN reconciled meds and allergies.  RTC:one week.    Treatment:  LLE: Dakin's soak , acticoat 7, urea cream and lidex to intact skin, zinc coflex  RLE: urea cream to intact skin, coban 2.     Additional Wound Procedures    Performed by: Buena Manual, RN  Authorized by: Orlan Munch, DPM  Associated wounds:   Wound 10/31/20 Edema/Edema Management Lower Leg Anterior;Left  Wound 10/31/20 Edema/Edema Management Lower Leg Anterior;Right    Body area:  Lower extremity  Lower extremity location:  R lower leg and L lower leg    Compression wrap type: multi-layer    Wrap location: bilateral    Wrap location equal debrided location?: yes    Debrided location: left    Multi-layer: other     Procedure was performed in a clean field. The leg was washed with soap and water and then patted dry. Moisturizer was applied to the leg. A multi-layer compression bandage was applied per manufacturer's instructions. Patient tolerated procedure well and without complaints of pain or discomfort. Patient was educated regarding signs and symptoms of overcompression, including unrelieved pain, toes turning red or purple, or numbness in foot.  Patient was instructed to manually remove outer layer (without scissors) and contact clinic immediately.     Patient Education:  Elevate legs for 30 mins, 3x/day, above the level of the heart. Above the level of the heart, is key here. The easiest way to achieve this is to lay down flat and prop your legs up  with at least 2 pillows.    If wraps get wet/damaged, or if they're too tight, or if s/he has any questions: please call the [facility]. We are open M-F, 8:30-4:30, closed on weekends. If patient needs care during our off hours, please don't hesitate and go to Urgent Care or Emergency Room. Provided Pt with a printed handout regarding compressing wraps, and to never use scissors to remove wraps if too tight. Patient verbalized understanding.    Patient and/or Caregiver verbalized understanding of dressing changes/plan of care.    Manual Buena, RN   03/11/2024

## 2024-03-11 NOTE — Progress Notes (Signed)
 [facility]    Wound Follow-Up - Provider Documentation    PATIENT: [name] DOB: May 05, 1966   MR #: [id number]  AGE: 58 y.o.    REFERRING MD:  Elisabeth Lamar DASEN, MD PRIMARY MD: Elisabeth Lamar DASEN, MD        Chief Complaint   Patient presents with    Wound Check      The patient was seen in the office today and a follow up evaluation was performed, including a history & focused exam.     [name] is a 58 year old black male who presents today with Patient reports he has had recurrent wounds and swelling of both lower legs for the past two years.  He was being treated at a wound center in [place] but moved to [place]  in July 2023.  He has been self treating with gauze/coban dressings and took a prescription for penicillin in January 2024.  PMH significant for morbid obesity, current tobacco user, hypertention, GERD and chronic back pain. Negative for diabetes.   The patient states his chief complaint is wounds of bilateral lower legs.    History of Present Illness   [name] is a 58 y.o. male who presents to [facility] wound healing center bilateral lower extremity lymphedema, edema and chronic left leg ulcers.  Patient denies fever, chills, nausea, vomiting, and leg pain.     Patient's right leg ulcer is healed however patient still have open left leg ulcer.  Patient has bilateral lower extremity lymphedema/leg edema.    Patient has been treated with Dakin's 10 minutes soaking, gentamicin, Zetuvit.  He is under multilayer compression for his leg for his leg swelling control.    Interval HPI 12/25/2023: Patient was lost in follow-up since November of last year.  Patient lost his transportation and was not able to come into wound center for follow-up until today.  Patient has been using zinc Coflex at home to control his leg swelling until he ran out of his supply.  Patient has been using Kerlix and Coban to manage his left leg ulcer and  swelling.    Interval HPI 01/01/2024: Patient was treated with Dakin's 10 minutes soaking, Aquacel Ag applied to his leg ulcer.  Patient was placed under Coflex compression on left and Coban to compression on right last week.  Patient stated that he received a phone call regarding antibiotic therapy per his leg wound infection however he has not started the medication yet and he will start today.    Interval HPI 01/08/2024: Patient was treated with Dakin's 10 minutes soaking, gentamicin, Aquacel Ag to his leg ulcer.  Patient is under Coflex compression to control his leg swelling.  Patient has been taking Levaquin  for last 2 days.  Patient is still taking Levaquin .    Interval HPI 01/22/2024: Patient was treated with Dakin's 10-minute soaking, gentamicin, Aquacel Ag to his leg ulcers.  Patient on the Coflex compression to control his leg swelling.  Patient has finished taking Levaquin .  Patient missed last week's appointment secondary to patient was unable to find parking at the hospital for over 1 hour.  Patient left due to unable to find a parking spot.    Interval HPI 01/29/2024: Patient was treated with Dakin's 10 minutes soaking, gentamicin, Aquacel Ag to his leg ulcers.  Patient is under Unna boot/Coban 2 modify compression to control his leg swelling.    Interval HPI 02/19/2024: Patient treated with Dakin's 10  minutes soaking soaking, gentamicin, Aquacel Ag to his leg ulcers.  Patient on multilayer compression to control his leg swelling.    Interval HPI 03/11/2024: Patient was treated with Dakin's 10 minutes soaking, Acticoat 7 to his left leg ulcers.  Patient is under Radio broadcast assistant multilayer compression to control his leg swelling.    Past Medical History     Past Medical History:   Diagnosis Date    GERD (gastroesophageal reflux disease)     Hypertension     Lower back pain        Past Surgical History   No past surgical history on file.    Family History   No family history on file.    Social History   Social  History[1]    Allergies   Allergies[2]    Medications   Current Medications[3]    Physical Exam     Vitals:    03/11/24 1122   BP: 122/79   Pulse: 71   Resp: 18   Temp: 97.5 F (36.4 C)           Body mass index is 54.78 kg/m.    General:  Patient appears their stated age, well-nourished.  Alert and in no apparent distress.  Lungs: Respiratory effort unlabored, no signs of distress  Vascular: normal capillary refill  Skin: see focused wound care exam  Neuro: AAOx3 normal speech and appropriate affect, see wound care exam for sensory exam    Focused Wound Care Exam   Wound Assessment:   Wound 10/31/22 Lower Leg;Calf Left;Posterior (Active)   Date First Assessed: 10/31/22   Location: Lower Leg;Calf  Wound Location Orientation: Left;Posterior  Wound Description: lateral/posterior  Present on Original Admission: Yes      Assessments 03/11/2024 11:40 AM 03/11/2024 12:00 PM   Wound Image      Wound Base Description Partial Thickness;Scattered --   Peri-wound Description Hyperpigmented --   Wound Length (cm) 14 cm 14 cm   Wound Width (cm) 9 cm 9 cm   Wound Depth (cm) 0.1 cm 0.3 cm   Wound Surface Area (cm^2) 98.96 cm^2 98.96 cm^2   Wound Volume (cm^3) 6.597 cm^3 19.792 cm^3   Closure Open --   Drainage Amount Scant --   Drainage Description Serosanguinous --   Tunneling No --   Undermining  No --   Margins Defined edges --   Treatments Cleansed;Compression Rudine --       Active Orders   Date Order Priority Status Authorizing Provider   03/11/24 1159 DEBRIDEMENT Lower Leg;Calf Left;Posterior lateral/posterior Routine Active Orlan Munch, DPM       Inactive Orders   Date Order Priority Status Authorizing Provider   03/04/24 1204 DEBRIDEMENT Lower Leg;Calf Left;Posterior lateral/posterior Routine Completed Orlan Munch, DPM   02/19/24 1143 DEBRIDEMENT Lower Leg;Calf Left;Posterior lateral/posterior Routine Completed Orlan Munch, DPM   02/12/24 1142 DEBRIDEMENT Lower Leg;Calf Left;Posterior lateral/posterior Routine  Completed Orlan Munch, [facility]   01/29/24 1212 Additional Wound Procedures Routine Completed Orlan Munch, DPM   01/29/24 1152 DEBRIDEMENT Lower Leg;Calf Left;Posterior lateral/posterior Routine Completed Orlan Munch, DPM   01/22/24 1124 DEBRIDEMENT Lower Leg;Calf Left;Posterior lateral/posterior Routine Completed Orlan Munch, [facility]   01/08/24 1232 Additional Wound Procedures Routine Completed Orlan Munch, DPM   01/08/24 1208 DEBRIDEMENT Lower Leg;Calf Left;Posterior lateral/posterior Routine Completed Orlan Munch, DPM   01/01/24 1157 Additional Wound Procedures Routine Completed Orlan Munch, [facility]   01/01/24 1157 DEBRIDEMENT Lower Leg;Calf Left;Lateral lateral/posterior Routine Completed Orlan Munch, DPM   12/25/23 1206 DEBRIDEMENT Lower  Leg;Calf Left;Lateral Routine Completed Orlan Munch, DPM   08/14/23 1200 DEBRIDEMENT Lower Leg;Calf Left;Lateral Routine Completed Orlan Munch, DPM   07/03/23 1140 DEBRIDEMENT Lower Leg;Calf Left;Lateral Routine Completed Orlan Munch, DPM   06/19/23 1209 DEBRIDEMENT Lower Leg;Calf Left;Lateral Routine Completed Orlan Munch, DPM   06/05/23 1205 DEBRIDEMENT Lower Leg;Calf Left;Lateral Routine Completed Orlan Munch, DPM   05/22/23 1229 DEBRIDEMENT Lower Leg;Calf Left;Lateral Routine Completed Orlan Munch, DPM   05/08/23 1202 DEBRIDEMENT Lower Leg;Calf Left;Lateral Routine Completed Orlan Munch, DPM       Wound 10/31/20 Edema/Edema Management Lower Leg Anterior;Left (Active)   Date First Assessed/Time First Assessed: 10/31/20 0800   Primary Wound Type: Edema/Edema Management  Edema extremity: LLE  Edema Swelling: Pitting  Location: Lower Leg  Wound Location Orientation: Anterior;Left      Assessments 03/11/2024 11:40 AM   Wound Image     Treatments Cleansed;Compression Rudine       Active Orders   Date Order Priority Status Authorizing Provider   03/11/24 1218 Additional Wound Procedures Routine Active Orlan Munch, DPM       Inactive Orders   Date  Order Priority Status Authorizing Provider   03/04/24 1235 Additional Wound Procedures Routine Completed Orlan Munch, DPM   02/19/24 1157 Additional Wound Procedures Routine Completed Orlan Munch, DPM   01/29/24 1212 Additional Wound Procedures Routine Completed Orlan Munch, DPM   01/22/24 1154 Additional Wound Procedures Routine Completed Orlan Munch, DPM   01/08/24 1232 Additional Wound Procedures Routine Completed Orlan Munch, DPM   01/01/24 1157 Additional Wound Procedures Routine Completed Orlan Munch, DPM   08/14/23 1203 Additional Wound Procedures Routine Completed Orlan Munch, DPM   06/05/23 1215 Additional Wound Procedures Routine Completed Orlan Munch, DPM   05/22/23 1221 Additional Wound Procedures Routine Completed Orlan Munch, DPM   05/08/23 1230 Additional Wound Procedures Routine Completed Orlan Munch, DPM       Wound 10/31/20 Edema/Edema Management Lower Leg Anterior;Right (Active)   Date First Assessed/Time First Assessed: 10/31/20 0800   Primary Wound Type: Edema/Edema Management  Edema extremity: RLE  Edema Swelling: Pitting  Location: Lower Leg  Wound Location Orientation: Anterior;Right      Assessments 03/11/2024 11:40 AM   Wound Image     Treatments Cleansed   Compression Wrap 1 Coban 2 or equivalent       Active Orders   Date Order Priority Status Authorizing Provider   03/11/24 1218 Additional Wound Procedures Routine Active Orlan Munch, DPM       Inactive Orders   Date Order Priority Status Authorizing Provider   03/04/24 1235 Additional Wound Procedures Routine Completed Orlan Munch, DPM   02/19/24 1157 Additional Wound Procedures Routine Completed Orlan Munch, DPM   01/22/24 1154 Additional Wound Procedures Routine Completed Orlan Munch, DPM   01/08/24 1232 Additional Wound Procedures Routine Completed Orlan Munch, DPM   01/01/24 1157 Additional Wound Procedures Routine Completed Orlan Munch, DPM   08/14/23 1203 Additional Wound Procedures  Routine Completed Orlan Munch, DPM   06/05/23 1215 Additional Wound Procedures Routine Completed Orlan Munch, DPM   05/22/23 1221 Additional Wound Procedures Routine Completed Orlan Munch, DPM   05/08/23 1230 Additional Wound Procedures Routine Completed Orlan Munch, DPM       Edema  R Foot Measurements(cm): 26.5 cm  R Ankle Measurements(cm): 26.5 cm  R Calf Measurements(cm): 51 cm  RLE Treatment: RLE Multilayer  RLE Multilayer Tx: Coban 2  L Foot Measurements(cm): 26 cm  L Ankle Measurements(cm): 27 cm  L Calf Measurements(cm): 50.5 cm  LLE Treatment: LLE Multilayer  LLE Multilayer Tx: -- (zinc coflex wrap.)          Procedure Note   I decided to perform debridement today because, based on the patient's current status, there is an expectation that serial debridement will improve healing potential, reduce or control tissue infection, and prepare the tissue for surgical management. Surgical debridement is required to excise a specific, targeted area of devitalized or necrotic tissue along the margin of viable tissue by sharp dissection. I have reviewed the individualized treatment plan for this patient who requires a debridement. Debridement is needed as part of the plan to control heavy wound colonization. I previously assessed the patient's vascular status which is adequate. I have ensured that this ulcer has been adequately off-loaded. I am continuing to address nutritional issues, monitoring weights, encouraging the use of supplements and drawing laboratory tests when appropriate.    DEBRIDEMENT Lower Leg;Calf Left;Posterior lateral/posterior    Performed by: Orlan Munch, DPM  Authorized by: Orlan Munch, DPM  Associated wounds:   Wound 10/31/22 Lower Leg;Calf Left;Posterior      Consent:     Consent obtained:  Verbal and written    Consent given by:  Patient    Risks discussed: Yes      Time out: Immediately prior to the procedure a time out was called    Time out performed at:  03/11/2024 11:59  AM    Debridement Details:     Performed by:  Physician    Type: excisional      Level: subcutaneous tissue      Pain control:  Lidocaine 2%    Pain control administration: topical        Time taken:  03/11/2024 11:59 AM  Pre-debridement measurements    Length (cm):  14    Width (cm):  9    Depth (cm):  0.1    Surface Area (cm^2):  126    Time taken:  03/11/2024 12:00 PM    Post-debridement measurements    Length (cm):  14    Width (cm):  9    Depth (cm):  0.3    Percent Debrided (%):  20    Surface Area (cm^2):  126    Area Debrided (cm^2):  25.2    Volume (cm^3):  37.8    Devitalized tissue debrided: biofilm, fibrin and necrotic debris      Instrument:  Scalpel #10    Amount of bleeding: small    Specimen sent for culture:   Specimen sent for culture comment:  No  Specimen sent for pathology:   Specimen sent for pathology comment:  No  Culture obtained:  no  Culture obtained comment:  Culture not obtained.    Hemostasis obtained with:  Pressure    Procedural pain:  0    Post-procedural pain:  0    Response to treatment:  Procedure was tolerated well   The wound bed is comprised of 0% granulation tissue and 100% necrotic tissue nonviable tissue.         Assessment & Plan   I examined the patient for an wound(s) that has been resistant to healing despite numerous interventions. The plan is to now examine any underlying characteristics that may impede healing. I decided that the wounds are not self limited and significantly impact the patient's daily living.    Wound Assessment and Plan:  The wounds are assessed as followed: stable.    No sign of clinical infection.  After evaluation of the patient and ulcer/wound characteristics, I decided that debridement was indicated today.    Wound Care Plan:  Advanced Wound dressings appropriate to the needs of the wound have been initiated to provide a microenvironment which can enhance healing including maintaining a moist environment, decreasing bioburden and insulating and  protecting the wound.     Patient was treated with Dakin's 10 minutes soaking, Acticoat 7 applied  Patient was placed under the multilayer compressions to control his leg swelling.  Unna boot/Coban 2 modified compression applied    Vascular Status:   The patient's vascular status was assessed and it was determined that their vascular status is adequate for healing.     Edema Management:    The patient has edema to Bilateral lower extremities.  I reviewed the leg, ankle and foot circumference measurements to assess the patient's response to compression. The edema is +3 pitting edema. Edema control is imperative for healing.  Patient instructed to elevate the lower extremities above the level of the heart when possible.  Active compression will be used to gain control of the patient's edema. Edema managed through use of Coban 2. I described the possible complications compression wraps and told the patient to remove them if they felt too tight or became soiled, but otherwise they will leave the wraps in place until the next visit..     Nutrition:    Nutrition status is adequate.  I made suggestions regarding appropriate protein intake, vitamin supplementation, and adequate hydration. Recommendations include healthy diet    Other comorbidities that may affect wound healing: Non-healing wounds are a symptom of their significant comorbid diseases. edema and lymphedema    Coordination of Care: I recommended none needed at this time    Patient Education:  Dressing management. S/S infection and to call clinic/ED precautions if any were to occur.   Patient verbalized understanding and agreed with POC.   Home health was not indicated at this time..    The patient will continue to be seen on a regular basis.  Follow up in clinic in 1 week and PRN interval nurse visits for dressing changes.    Roosvelt Needle, DPM  03/11/2024          [1]   Social History  Socioeconomic History    Marital status: Married   Tobacco Use    Smoking  status: Heavy Smoker     Current packs/day: 1.00     Types: Cigarettes    Smokeless tobacco: Current   Substance and Sexual Activity    Alcohol use: Yes     Comment: occasionally     Drug use: Yes     Comment: marijuana     Social Drivers of Health     Food Insecurity: Unknown (11/07/2022)    Hunger Vital Sign     Worried About Running Out of Food in the Last Year: Never true    Received from [employer]    Social Network    Received from [facility]    HITS   [2] No Known Allergies  [3]   Current Outpatient Medications:     albuterol  sulfate HFA (PROVENTIL ) 108 (90 Base) MCG/ACT inhaler, Inhale 2 puffs into the lungs every 4 (four) hours as needed for Wheezing or Shortness of Breath (coughing) Dispense with spacer, Disp: 6.7 g, Rfl: 0    amlodipine-benazepril (LOTREL) 10-20 MG per capsule, Take 1 capsule by mouth daily, Disp: , Rfl:     benzonatate  (TESSALON ) 100  MG capsule, Take 1 capsule (100 mg) by mouth 3 (three) times daily as needed for Cough, Disp: 15 capsule, Rfl: 0    candesartan (ATACAND) 16 MG tablet, Take 1 tablet (16 mg) by mouth 2 (two) times daily, Disp: , Rfl:     carvedilol (COREG) 25 MG tablet, Take 1 tablet (25 mg) by mouth daily In the morning, Disp: , Rfl:     cloNIDine (CATAPRES) 0.3 MG tablet, Take 1 tablet (0.3 mg) by mouth 2 (two) times daily, Disp: , Rfl:     furosemide (LASIX) 40 MG tablet, Take 1 tablet (40 mg) by mouth 2 (two) times daily, Disp: , Rfl:     hydrALAZINE (APRESOLINE) 50 MG tablet, Take 1 tablet (50 mg) by mouth 2 (two) times daily, Disp: , Rfl:     hydrochlorothiazide (HYDRODIURIL) 25 MG tablet, Take 1 tablet (25 mg) by mouth daily, Disp: , Rfl:     naproxen  (NAPROSYN ) 500 MG tablet, Take 1 tablet (500 mg) by mouth 2 (two) times daily as needed, Disp: , Rfl:     omeprazole (PRILOSEC) 40 MG capsule, Take 1 capsule (40 mg) by mouth daily, Disp: , Rfl:     potassium chloride (KLOR-CON M20) 20 MEQ CR tablet, Take 1 tablet (20 mEq) by mouth once daily, Disp: , Rfl:      carvedilol (COREG) 12.5 MG tablet, Take 1 tablet (12.5 mg) by mouth nightly (Patient not taking: Reported on 03/11/2024), Disp: , Rfl:

## 2024-03-18 ENCOUNTER — Ambulatory Visit

## 2024-03-25 ENCOUNTER — Ambulatory Visit: Admitting: Foot & Ankle Surgery

## 2024-03-25 VITALS — BP 156/79 | HR 67 | Temp 97.7°F

## 2024-03-25 DIAGNOSIS — L97222 Non-pressure chronic ulcer of left calf with fat layer exposed: Secondary | ICD-10-CM | POA: Insufficient documentation

## 2024-03-25 DIAGNOSIS — I10 Essential (primary) hypertension: Secondary | ICD-10-CM | POA: Insufficient documentation

## 2024-03-25 DIAGNOSIS — R600 Localized edema: Secondary | ICD-10-CM

## 2024-03-25 DIAGNOSIS — I890 Lymphedema, not elsewhere classified: Secondary | ICD-10-CM | POA: Insufficient documentation

## 2024-03-25 NOTE — Progress Notes (Signed)
 Ambulatory  [facility]aling and [facility]  [facility]:  [address] [place], [place] [address]  T [phone]  F [phone]  Wound Follow-up - Nursing Documentation    PATIENT: [name] DOB: 06-08-1966   MR #: [id number]  AGE: 58 y.o.    REFERRING MD:  Elisabeth Lamar DASEN, MD PRIMARY MD: Elisabeth Lamar DASEN, MD      Chief Complaint   Patient presents with    Wound Check    Edema        Assessment & Plan   Patient arrived in the wound center on a wheel chair. RN reconciled meds and allergies. Patient missed last week appointment reported he was not feeling well.    Case Management:  Patient identification verified using two identifiers.  RN reconciled meds and allergies.  RTC:one week.    Treatment:  LLE: Dakin's soak , Aquacel Ag, ABD, urea cream and lidex to intact skin, zinc coflex  RLE: urea cream to intact skin, coban 2.     RTC: 1 week    Additional Wound Procedures    Performed by: Caterin Tabares IV V, RN  Authorized by: Orlan Munch, DPM  Associated wounds:   Wound 10/31/22 Lower Leg;Calf Left;Posterior  Wound 10/31/20 Edema/Edema Management Lower Leg Anterior;Left  Wound 10/31/20 Edema/Edema Management Lower Leg Anterior;Right    Body area:  Lower extremity  Lower extremity location:  R lower leg and L lower leg    Compression wrap type: multi-layer    Wrap location: bilateral    Wrap location equal debrided location?: no    Multi-layer: 2 layer     Procedure was performed in a clean field. The leg was washed with soap and water and then patted dry. Moisturizer was applied to the leg. A multi-layer compression bandage was applied per manufacturer's instructions. Patient tolerated procedure well and without complaints of pain or discomfort. Patient was educated regarding signs and symptoms of overcompression, including unrelieved pain, toes turning red or purple, or numbness in foot.  Patient was instructed to manually remove outer layer (without scissors) and contact clinic  immediately.     Patient Education:  Elevate legs for 30 mins, 3x/day, above the level of the heart. Above the level of the heart, is key here. The easiest way to achieve this is to lay down flat and prop your legs up with at least 2 pillows.    If wraps get wet/damaged, or if they're too tight, or if s/he has any questions: please call the [name]. We are open M-F, 8:30-4:30, closed on weekends. If patient needs care during our off hours, please don't hesitate and go to Urgent Care or Emergency Room. Provided Pt with a printed handout regarding compressing wraps, and to never use scissors to remove wraps if too tight. Patient verbalized understanding.    Patient and/or Caregiver verbalized understanding of dressing changes/plan of care.    Tonimarie Gritz IV LULLA Ket, RN   03/25/2024

## 2024-03-25 NOTE — Progress Notes (Signed)
 [facility]    Wound Follow-Up - Provider Documentation    PATIENT: [name] DOB: [birth year]   MR #: [id number]  AGE: 58 y.o.    REFERRING MD:  Elisabeth Lamar DASEN, MD PRIMARY MD: Elisabeth Lamar DASEN, MD        Chief Complaint   Patient presents with    Wound Check    Edema      The patient was seen in the office today and a follow up evaluation was performed, including a history & focused exam.     Mr. Connell Bognar is a 57 year old black male who presents today with Patient reports he has had recurrent wounds and swelling of both lower legs for the past two years.  He was being treated at a wound center in [place] but moved to [place]  in July 2023.  He has been self treating with gauze/coban dressings and took a prescription for penicillin in January 2024.  PMH significant for morbid obesity, current tobacco user, hypertention, GERD and chronic back pain. Negative for diabetes.   The patient states his chief complaint is wounds of bilateral lower legs.    History of Present Illness   [name] is a 58 y.o. male who presents to [facility] wound healing center bilateral lower extremity lymphedema, edema and chronic left leg ulcers.  Patient denies fever, chills, nausea, vomiting, and leg pain.     Patient's right leg ulcer is healed however patient still have open left leg ulcer.  Patient has bilateral lower extremity lymphedema/leg edema.    Patient has been treated with Dakin's 10 minutes soaking, gentamicin, Zetuvit.  He is under multilayer compression for his leg for his leg swelling control.    Interval HPI 12/25/2023: Patient was lost in follow-up since November of last year.  Patient lost his transportation and was not able to come into wound center for follow-up until today.  Patient has been using zinc Coflex at home to control his leg swelling until he ran out of his supply.  Patient has been using Kerlix and Coban to manage his left leg  ulcer and swelling.    Interval HPI 01/01/2024: Patient was treated with Dakin's 10 minutes soaking, Aquacel Ag applied to his leg ulcer.  Patient was placed under Coflex compression on left and Coban to compression on right last week.  Patient stated that he received a phone call regarding antibiotic therapy per his leg wound infection however he has not started the medication yet and he will start today.    Interval HPI 01/08/2024: Patient was treated with Dakin's 10 minutes soaking, gentamicin, Aquacel Ag to his leg ulcer.  Patient is under Coflex compression to control his leg swelling.  Patient has been taking Levaquin  for last 2 days.  Patient is still taking Levaquin .    Interval HPI 01/22/2024: Patient was treated with Dakin's 10-minute soaking, gentamicin, Aquacel Ag to his leg ulcers.  Patient on the Coflex compression to control his leg swelling.  Patient has finished taking Levaquin .  Patient missed last week's appointment secondary to patient was unable to find parking at the hospital for over 1 hour.  Patient left due to unable to find a parking spot.    Interval HPI 01/29/2024: Patient was treated with Dakin's 10 minutes soaking, gentamicin, Aquacel Ag to his leg ulcers.  Patient is under Unna boot/Coban 2 modify compression to control his leg swelling.    Interval HPI 02/19/2024: Patient  treated with Dakin's 10 minutes soaking soaking, gentamicin, Aquacel Ag to his leg ulcers.  Patient on multilayer compression to control his leg swelling.    Interval HPI 03/11/2024: Patient was treated with Dakin's 10 minutes soaking, Acticoat 7 to his left leg ulcers.  Patient is under Radio broadcast assistant multilayer compression to control his leg swelling.    Interval HPI 03/25/2024: Patient missed appointment last week due to patient was not feeling well.  Patient was treated with Dakin's 10 minutes soaking, Acticoat 7 to his left leg ulcer.  Patient is under Coflex Coban 2 multilayer compression to his left leg and Coban 2 on  right leg to control his leg swelling.    Past Medical History     Past Medical History:   Diagnosis Date    GERD (gastroesophageal reflux disease)     Hypertension     Lower back pain        Past Surgical History   No past surgical history on file.    Family History   No family history on file.    Social History   Social History[1]    Allergies   Allergies[2]    Medications   Current Medications[3]    Physical Exam     Vitals:    03/25/24 1143   BP: 156/79   Pulse: 67   Temp: 97.7 F (36.5 C)             There is no height or weight on file to calculate BMI.    General:  Patient appears their stated age, well-nourished.  Alert and in no apparent distress.  Lungs: Respiratory effort unlabored, no signs of distress  Vascular: normal capillary refill  Skin: see focused wound care exam  Neuro: AAOx3 normal speech and appropriate affect, see wound care exam for sensory exam    Focused Wound Care Exam   Wound Assessment:   Wound 10/31/22 Lower Leg;Calf Left;Posterior (Active)   Date First Assessed: 10/31/22   Location: Lower Leg;Calf  Wound Location Orientation: Left;Posterior  Wound Description: lateral/posterior  Present on Original Admission: Yes      Assessments 03/25/2024 11:55 AM   Wound Image      Wound Length (cm) 13.4 cm   Wound Width (cm) 18.5 cm   Wound Depth (cm) 0.1 cm   Wound Surface Area (cm^2) 194.7 cm^2   Wound Volume (cm^3) 12.98 cm^3       Wound 10/31/20 Edema/Edema Management Lower Leg Anterior;Left (Active)   Date First Assessed/Time First Assessed: 10/31/20 0800   Primary Wound Type: Edema/Edema Management  Edema extremity: LLE  Edema Swelling: Pitting  Location: Lower Leg  Wound Location Orientation: Anterior;Left      Assessments 03/25/2024 11:54 AM   Wound Image         Wound 10/31/20 Edema/Edema Management Lower Leg Anterior;Right (Active)   Date First Assessed/Time First Assessed: 10/31/20 0800   Primary Wound Type: Edema/Edema Management  Edema extremity: RLE  Edema Swelling: Pitting  Location:  Lower Leg  Wound Location Orientation: Anterior;Right      Assessments 03/25/2024 11:54 AM   Wound Image           No data recorded        Procedure Note   I decided to perform debridement today because, based on the patient's current status, there is an expectation that serial debridement will improve healing potential, reduce or control tissue infection, and prepare the tissue for surgical management. Surgical debridement is required to excise  a specific, targeted area of devitalized or necrotic tissue along the margin of viable tissue by sharp dissection. I have reviewed the individualized treatment plan for this patient who requires a debridement. Debridement is needed as part of the plan to control heavy wound colonization. I previously assessed the patient's vascular status which is adequate. I have ensured that this ulcer has been adequately off-loaded. I am continuing to address nutritional issues, monitoring weights, encouraging the use of supplements and drawing laboratory tests when appropriate.    Procedures     Assessment & Plan   I examined the patient for an wound(s) that has been resistant to healing despite numerous interventions. The plan is to now examine any underlying characteristics that may impede healing. I decided that the wounds are not self limited and significantly impact the patient's daily living.    Wound Assessment and Plan:  The wounds are assessed as followed: stable.    No sign of clinical infection.    After evaluation of the patient and ulcer/wound characteristics, I decided that debridement was indicated today.    Wound Care Plan:  Advanced Wound dressings appropriate to the needs of the wound have been initiated to provide a microenvironment which can enhance healing including maintaining a moist environment, decreasing bioburden and insulating and protecting the wound.     He was treated with Dakin's 10 minutes soaking, Aquacel Ag applied.  Patient was placed under the  multilayer compressions to control his leg swelling.  Coflex Coban 2 applied to left leg, Coban 2 applied to the right leg.    Vascular Status:   The patient's vascular status was assessed and it was determined that their vascular status is adequate for healing.     Edema Management:    The patient has edema to Bilateral lower extremities.  I reviewed the leg, ankle and foot circumference measurements to assess the patient's response to compression. The edema is +3 pitting edema. Edema control is imperative for healing.  Patient instructed to elevate the lower extremities above the level of the heart when possible.  Active compression will be used to gain control of the patient's edema. Edema managed through use of Coban 2. I described the possible complications compression wraps and told the patient to remove them if they felt too tight or became soiled, but otherwise they will leave the wraps in place until the next visit..     Nutrition:    Nutrition status is adequate.  I made suggestions regarding appropriate protein intake, vitamin supplementation, and adequate hydration. Recommendations include healthy diet    Other comorbidities that may affect wound healing: Non-healing wounds are a symptom of their significant comorbid diseases. edema and lymphedema    Coordination of Care: I recommended none needed at this time    Patient Education:  Dressing management. S/S infection and to call clinic/ED precautions if any were to occur.   Patient verbalized understanding and agreed with POC.   Home health was not indicated at this time..    The patient will continue to be seen on a regular basis.  Follow up in clinic in 1 week and PRN interval nurse visits for dressing changes.    Roosvelt Needle, DPM  03/25/2024          [1]   Social History  Socioeconomic History    Marital status: Married   Tobacco Use    Smoking status: Heavy Smoker     Current packs/day: 1.00     Types: Cigarettes  Smokeless tobacco: Current    Substance and Sexual Activity    Alcohol use: Yes     Comment: occasionally     Drug use: Yes     Comment: marijuana     Social Drivers of Health     Food Insecurity: Unknown (11/07/2022)    Hunger Vital Sign     Worried About Running Out of Food in the Last Year: Never true    Received from [employer]    Social Network    Received from [employer]    HITS   [2] No Known Allergies  [3]   Current Outpatient Medications:     albuterol  sulfate HFA (PROVENTIL ) 108 (90 Base) MCG/ACT inhaler, Inhale 2 puffs into the lungs every 4 (four) hours as needed for Wheezing or Shortness of Breath (coughing) Dispense with spacer, Disp: 6.7 g, Rfl: 0    amlodipine-benazepril (LOTREL) 10-20 MG per capsule, Take 1 capsule by mouth daily, Disp: , Rfl:     benzonatate  (TESSALON ) 100 MG capsule, Take 1 capsule (100 mg) by mouth 3 (three) times daily as needed for Cough, Disp: 15 capsule, Rfl: 0    candesartan (ATACAND) 16 MG tablet, Take 1 tablet (16 mg) by mouth 2 (two) times daily, Disp: , Rfl:     carvedilol (COREG) 12.5 MG tablet, Take 1 tablet (12.5 mg) by mouth nightly, Disp: , Rfl:     carvedilol (COREG) 25 MG tablet, Take 1 tablet (25 mg) by mouth daily In the morning, Disp: , Rfl:     cloNIDine (CATAPRES) 0.3 MG tablet, Take 1 tablet (0.3 mg) by mouth 2 (two) times daily, Disp: , Rfl:     furosemide (LASIX) 40 MG tablet, Take 1 tablet (40 mg) by mouth 2 (two) times daily, Disp: , Rfl:     hydrALAZINE (APRESOLINE) 50 MG tablet, Take 1 tablet (50 mg) by mouth 2 (two) times daily, Disp: , Rfl:     hydrochlorothiazide (HYDRODIURIL) 25 MG tablet, Take 1 tablet (25 mg) by mouth daily, Disp: , Rfl:     naproxen  (NAPROSYN ) 500 MG tablet, Take 1 tablet (500 mg) by mouth 2 (two) times daily as needed, Disp: , Rfl:     omeprazole (PRILOSEC) 40 MG capsule, Take 1 capsule (40 mg) by mouth daily, Disp: , Rfl:     potassium chloride (KLOR-CON M20) 20 MEQ CR tablet, Take 1 tablet (20 mEq) by mouth once daily, Disp: , Rfl:

## 2024-04-01 ENCOUNTER — Ambulatory Visit: Admitting: Foot & Ankle Surgery

## 2024-04-01 VITALS — BP 124/82 | HR 82 | Temp 98.2°F

## 2024-04-01 DIAGNOSIS — I890 Lymphedema, not elsewhere classified: Secondary | ICD-10-CM | POA: Insufficient documentation

## 2024-04-01 DIAGNOSIS — L97222 Non-pressure chronic ulcer of left calf with fat layer exposed: Secondary | ICD-10-CM | POA: Insufficient documentation

## 2024-04-01 DIAGNOSIS — R600 Localized edema: Secondary | ICD-10-CM

## 2024-04-01 NOTE — Progress Notes (Signed)
 [facility]    Wound Follow-Up - Provider Documentation    PATIENT: [name] DOB: September 04, 1966   MR #: [id number]  AGE: 58 y.o.    REFERRING MD:  Elisabeth Lamar DASEN, MD PRIMARY MD: Elisabeth Lamar DASEN, MD        Chief Complaint   Patient presents with    Wound Check    Laceration      The patient was seen in the office today and a follow up evaluation was performed, including a history & focused exam.     [name] is a 58 year old black male who presents today with Patient reports he has had recurrent wounds and swelling of both lower legs for the past two years.  He was being treated at a wound center in [place] but moved to [place]  in July 2023.  He has been self treating with gauze/coban dressings and took a prescription for penicillin in January 2024.  PMH significant for morbid obesity, current tobacco user, hypertention, GERD and chronic back pain. Negative for diabetes.   The patient states his chief complaint is wounds of bilateral lower legs.    History of Present Illness   [name] is a 58 y.o. male who presents to [facility] wound healing center bilateral lower extremity lymphedema, edema and chronic left leg ulcers.  Patient denies fever, chills, nausea, vomiting, and leg pain.     Patient's right leg ulcer is healed however patient still have open left leg ulcer.  Patient has bilateral lower extremity lymphedema/leg edema.    Patient has been treated with Dakin's 10 minutes soaking, gentamicin, Zetuvit.  He is under multilayer compression for his leg for his leg swelling control.    Interval HPI 12/25/2023: Patient was lost in follow-up since November of last year.  Patient lost his transportation and was not able to come into wound center for follow-up until today.  Patient has been using zinc Coflex at home to control his leg swelling until he ran out of his supply.  Patient has been using Kerlix and Coban to manage his left leg  ulcer and swelling.    Interval HPI 01/01/2024: Patient was treated with Dakin's 10 minutes soaking, Aquacel Ag applied to his leg ulcer.  Patient was placed under Coflex compression on left and Coban to compression on right last week.  Patient stated that he received a phone call regarding antibiotic therapy per his leg wound infection however he has not started the medication yet and he will start today.    Interval HPI 01/08/2024: Patient was treated with Dakin's 10 minutes soaking, gentamicin, Aquacel Ag to his leg ulcer.  Patient is under Coflex compression to control his leg swelling.  Patient has been taking Levaquin  for last 2 days.  Patient is still taking Levaquin .    Interval HPI 01/22/2024: Patient was treated with Dakin's 10-minute soaking, gentamicin, Aquacel Ag to his leg ulcers.  Patient on the Coflex compression to control his leg swelling.  Patient has finished taking Levaquin .  Patient missed last week's appointment secondary to patient was unable to find parking at the hospital for over 1 hour.  Patient left due to unable to find a parking spot.    Interval HPI 01/29/2024: Patient was treated with Dakin's 10 minutes soaking, gentamicin, Aquacel Ag to his leg ulcers.  Patient is under Unna boot/Coban 2 modify compression to control his leg swelling.    Interval HPI 02/19/2024: Patient  treated with Dakin's 10 minutes soaking soaking, gentamicin, Aquacel Ag to his leg ulcers.  Patient on multilayer compression to control his leg swelling.    Interval HPI 03/11/2024: Patient was treated with Dakin's 10 minutes soaking, Acticoat 7 to his left leg ulcers.  Patient is under Radio broadcast assistant multilayer compression to control his leg swelling.    Interval HPI 04/01/2024: Patient missed appointment last week due to patient was not feeling well.  Patient was treated with Dakin's 10 minutes soaking, Aquacel Ag to his left leg ulcer.  Patient is under Coflex Coban 2 multilayer compression to his left leg and Coban 2 on  right leg to control his leg swelling.    Past Medical History     Past Medical History:   Diagnosis Date    GERD (gastroesophageal reflux disease)     Hypertension     Lower back pain        Past Surgical History   No past surgical history on file.    Family History   No family history on file.    Social History   Social History[1]    Allergies   Allergies[2]    Medications   Current Medications[3]    Physical Exam     Vitals:    04/01/24 1101   BP: 124/82   Pulse: 82   Temp: 98.2 F (36.8 C)     There is no height or weight on file to calculate BMI.    General:  Patient appears their stated age, well-nourished.  Alert and in no apparent distress.  Lungs: Respiratory effort unlabored, no signs of distress  Vascular: normal capillary refill  Skin: see focused wound care exam  Neuro: AAOx3 normal speech and appropriate affect, see wound care exam for sensory exam    Focused Wound Care Exam   Wound Assessment:   Wound 10/31/22 Lower Leg;Calf Left;Posterior (Active)   Date First Assessed: 10/31/22   Location: Lower Leg;Calf  Wound Location Orientation: Left;Posterior  Wound Description: lateral/posterior  Present on Original Admission: Yes      Assessments 04/01/2024 11:13 AM 04/01/2024 11:14 AM   Wound Image       Wound Base Description Full Thickness;Red --   Peri-wound Description Hyperpigmented --   Wound Length (cm) 17.8 cm 17.8 cm   Wound Width (cm) 24 cm 24 cm   Wound Depth (cm) 0.1 cm 0.3 cm   Wound Surface Area (cm^2) 335.52 cm^2 335.52 cm^2   Wound Volume (cm^3) 22.368 cm^3 67.104 cm^3   Closure Open --   Drainage Amount Large --   Drainage Description Serosanguinous --   Margins Open --   Treatments Cleansed;Compression Wrap1 --   Compression Wrap 1 Coflex TLC x/Zinc --   Topical Dakins Solution --       Wound 10/31/20 Edema/Edema Management Lower Leg Anterior;Left (Active)   Date First Assessed/Time First Assessed: 10/31/20 0800   Primary Wound Type: Edema/Edema Management  Edema extremity: LLE  Edema  Swelling: Pitting  Location: Lower Leg  Wound Location Orientation: Anterior;Left      Assessments 04/01/2024 11:13 AM   Wound Image     Treatments Cleansed;Compression Wrap1   Compression Wrap 1 Coban 2 or equivalent       Wound 10/31/20 Edema/Edema Management Lower Leg Anterior;Right (Active)   Date First Assessed/Time First Assessed: 10/31/20 0800   Primary Wound Type: Edema/Edema Management  Edema extremity: RLE  Edema Swelling: Pitting  Location: Lower Leg  Wound Location Orientation: Anterior;Right  Assessments 04/01/2024 11:12 AM   Wound Image     Treatments Cleansed;Compression Wrap1   Compression Wrap 1 Coban 2 or equivalent   Topical Moisturizing cream     Edema  RLE Edema: Brawny  R Foot Measurements(cm): 27 cm  R Ankle Measurements(cm): 26.5 cm  R Calf Measurements(cm): 50 cm  RLE Treatment: RLE Multilayer  RLE Multilayer Tx: Coban 2  LLE Edema: Brawny  L Foot Measurements(cm): 26.5 cm  L Ankle Measurements(cm): 27.5 cm  L Calf Measurements(cm): 51.5 cm  LLE Treatment: LLE Multilayer  LLE Multilayer Tx: Other      Procedure Note   I decided to perform debridement today because, based on the patient's current status, there is an expectation that serial debridement will improve healing potential, reduce or control tissue infection, and prepare the tissue for surgical management. Surgical debridement is required to excise a specific, targeted area of devitalized or necrotic tissue along the margin of viable tissue by sharp dissection. I have reviewed the individualized treatment plan for this patient who requires a debridement. Debridement is needed as part of the plan to control heavy wound colonization. I previously assessed the patient's vascular status which is adequate. I have ensured that this ulcer has been adequately off-loaded. I am continuing to address nutritional issues, monitoring weights, encouraging the use of supplements and drawing laboratory tests when appropriate.    DEBRIDEMENT Lower  Leg;Calf Left;Posterior lateral/posterior    Performed by: Orlan Munch, DPM  Authorized by: Orlan Munch, DPM  Associated wounds:   Wound 10/31/22 Lower Leg;Calf Left;Posterior      Consent:     Consent obtained:  Verbal and written    Consent given by:  Patient    Risks discussed: Yes      Time out: Immediately prior to the procedure a time out was called    Time out performed at:  04/01/2024 11:30 AM    Debridement Details:     Performed by:  Physician    Type: excisional      Level: subcutaneous tissue      Pain control:  Lidocaine 2%    Pain control administration: topical        Time taken:  04/01/2024 11:13 AM  Pre-debridement measurements    Length (cm):  17.8    Width (cm):  24    Depth (cm):  0.1    Surface Area (cm^2):  427.2    Time taken:  04/01/2024 11:14 AM    Post-debridement measurements    Length (cm):  17.8    Width (cm):  24    Depth (cm):  0.3    Percent Debrided (%):  10    Surface Area (cm^2):  427.2    Area Debrided (cm^2):  42.72    Volume (cm^3):  128.16    Devitalized tissue debrided: biofilm, fibrin and necrotic debris      Instrument:  Curette and scalpel #15    Amount of bleeding: small    Specimen sent for culture: yes  Specimen sent for culture comment:  Yes  Specimen sent for pathology:   Specimen sent for pathology comment:  No  Culture obtained:  yes  Culture obtained comment:  Culture obtained.    Hemostasis obtained with:  Pressure    Procedural pain:  0    Post-procedural pain:  0    Response to treatment:  Procedure was tolerated well   The wound bed is comprised of 25-50% beefy red granulation tissue and >50% slough/fibrin  and necrotic tissue nonviable tissue.         Assessment & Plan   I examined the patient for an wound(s) that has been resistant to healing despite numerous interventions. The plan is to now examine any underlying characteristics that may impede healing. I decided that the wounds are not self limited and significantly impact the patient's daily  living.    Wound Assessment and Plan:  The wounds are assessed as followed: stable.    No sign of clinical infection however patient does have some worsening with increased drainage, wound cultures performed and sent to the lab.    After evaluation of the patient and ulcer/wound characteristics, I decided that debridement was indicated today.    Wound Care Plan:  Advanced Wound dressings appropriate to the needs of the wound have been initiated to provide a microenvironment which can enhance healing including maintaining a moist environment, decreasing bioburden and insulating and protecting the wound.     Dakin's 10 minutes soaking, Bactroban, Acticoat 7 applied  Unnaboot modified Coban2 to applied to left leg and zinc Coflex 2 applied to right leg.    Vascular Status:   The patient's vascular status was assessed and it was determined that their vascular status is adequate for healing.     Edema Management:    The patient has edema to Bilateral lower extremities.  I reviewed the leg, ankle and foot circumference measurements to assess the patient's response to compression. The edema is +3 pitting edema. Edema control is imperative for healing.  Patient instructed to elevate the lower extremities above the level of the heart when possible.  Active compression will be used to gain control of the patient's edema. Edema managed through use of Coban 2. I described the possible complications compression wraps and told the patient to remove them if they felt too tight or became soiled, but otherwise they will leave the wraps in place until the next visit..     Nutrition:    Nutrition status is adequate.  I made suggestions regarding appropriate protein intake, vitamin supplementation, and adequate hydration. Recommendations include healthy diet    Other comorbidities that may affect wound healing: Non-healing wounds are a symptom of their significant comorbid diseases. edema and lymphedema    Coordination of Care: I  recommended none needed at this time    Patient Education:  Dressing management. S/S infection and to call clinic/ED precautions if any were to occur.   Patient verbalized understanding and agreed with POC.   Home health was not indicated at this time..    The patient will continue to be seen on a regular basis.  Follow up in clinic in 1 week and PRN interval nurse visits for dressing changes.    Roosvelt Needle, DPM  04/01/2024          [1]   Social History  Socioeconomic History    Marital status: Married   Tobacco Use    Smoking status: Heavy Smoker     Current packs/day: 1.00     Types: Cigarettes    Smokeless tobacco: Current   Substance and Sexual Activity    Alcohol use: Yes     Comment: occasionally     Drug use: Yes     Comment: marijuana     Social Drivers of Health     Food Insecurity: Unknown (11/07/2022)    Hunger Vital Sign     Worried About Running Out of Food in the Last Year: Never true  Received from [facility]    Social Network    Received from [facility]    HITS   [2] No Known Allergies  [3]   Current Outpatient Medications:     albuterol  sulfate HFA (PROVENTIL ) 108 (90 Base) MCG/ACT inhaler, Inhale 2 puffs into the lungs every 4 (four) hours as needed for Wheezing or Shortness of Breath (coughing) Dispense with spacer, Disp: 6.7 g, Rfl: 0    amlodipine-benazepril (LOTREL) 10-20 MG per capsule, Take 1 capsule by mouth daily, Disp: , Rfl:     benzonatate  (TESSALON ) 100 MG capsule, Take 1 capsule (100 mg) by mouth 3 (three) times daily as needed for Cough, Disp: 15 capsule, Rfl: 0    candesartan (ATACAND) 16 MG tablet, Take 1 tablet (16 mg) by mouth 2 (two) times daily, Disp: , Rfl:     carvedilol (COREG) 12.5 MG tablet, Take 1 tablet (12.5 mg) by mouth nightly, Disp: , Rfl:     carvedilol (COREG) 25 MG tablet, Take 1 tablet (25 mg) by mouth daily In the morning, Disp: , Rfl:     cloNIDine (CATAPRES) 0.3 MG tablet, Take 1 tablet (0.3 mg) by mouth 2 (two) times daily, Disp: , Rfl:     furosemide  (LASIX) 40 MG tablet, Take 1 tablet (40 mg) by mouth 2 (two) times daily, Disp: , Rfl:     hydrALAZINE (APRESOLINE) 50 MG tablet, Take 1 tablet (50 mg) by mouth 2 (two) times daily, Disp: , Rfl:     hydrochlorothiazide (HYDRODIURIL) 25 MG tablet, Take 1 tablet (25 mg) by mouth daily, Disp: , Rfl:     naproxen  (NAPROSYN ) 500 MG tablet, Take 1 tablet (500 mg) by mouth 2 (two) times daily as needed, Disp: , Rfl:     omeprazole (PRILOSEC) 40 MG capsule, Take 1 capsule (40 mg) by mouth daily, Disp: , Rfl:     potassium chloride (KLOR-CON M20) 20 MEQ CR tablet, Take 1 tablet (20 mEq) by mouth once daily, Disp: , Rfl:

## 2024-04-01 NOTE — Progress Notes (Signed)
 [facility]  [facility]:  [address] [place], [place] [address]  T [phone]  F [phone]  Wound Follow-up - Nursing Documentation    PATIENT: [name] DOB: Dec 19, 1965   MR #: [id number]  AGE: 57 y.o.    REFERRING MD:  Elisabeth Lamar DASEN, MD PRIMARY MD: Elisabeth Lamar DASEN, MD      Chief Complaint   Patient presents with    Wound Check    Laceration        Assessment & Plan   Patient arrived in the wound center on a wheel chair. RN reconciled meds and allergies. Culture done and sent to lab    Case Management:    Treatment:  LLE: Dakin's soak , Acticoat7, unna boot, sorbex , lidex to intact skin, coban 2  RLE: urea cream to intact skin, coban 2.     RTC: 1 week    Additional Wound Procedures    Performed by: De Jaworski IV V, RN  Authorized by: Orlan Munch, DPM  Associated wounds:   Wound 10/31/22 Lower Leg;Calf Left;Posterior  Wound 10/31/20 Edema/Edema Management Lower Leg Anterior;Left  Wound 10/31/20 Edema/Edema Management Lower Leg Anterior;Right    Body area:  Lower extremity  Lower extremity location:  R lower leg and L lower leg    Compression wrap type: multi-layer    Wrap location: bilateral    Wrap location equal debrided location?: yes    Debrided location: left    Multi-layer: 2 layer     Procedure was performed in a clean field. The leg was washed with soap and water and then patted dry. Moisturizer was applied to the leg. A multi-layer compression bandage was applied per manufacturer's instructions. Patient tolerated procedure well and without complaints of pain or discomfort. Patient was educated regarding signs and symptoms of overcompression, including unrelieved pain, toes turning red or purple, or numbness in foot.  Patient was instructed to manually remove outer layer (without scissors) and contact clinic immediately.       Patient Education:  Elevate legs for 30 mins, 3x/day, above the level of the heart. Above the level of the  heart, is key here. The easiest way to achieve this is to lay down flat and prop your legs up with at least 2 pillows.    If wraps get wet/damaged, or if they're too tight, or if s/he has any questions: please call the [facility]. We are open M-F, 8:30-4:30, closed on weekends. If patient needs care during our off hours, please don't hesitate and go to Urgent Care or Emergency Room. Provided Pt with a printed handout regarding compressing wraps, and to never use scissors to remove wraps if too tight. Patient verbalized understanding.    Patient and/or Caregiver verbalized understanding of dressing changes/plan of care.    Minervia Osso IV LULLA Ket, RN   04/01/2024

## 2024-04-04 LAB — CULTURE AND GRAM STAIN, AEROBIC BACTERIA, WOUND/TISSUE/FLUID
Gram Stain: NONE SEEN
Gram Stain: NONE SEEN

## 2024-04-06 ENCOUNTER — Ambulatory Visit: Payer: Self-pay | Admitting: Physician Assistant

## 2024-04-06 MED ORDER — AMOXICILLIN-POT CLAVULANATE 875-125 MG PO TABS
1.0000 | ORAL_TABLET | Freq: Two times a day (BID) | ORAL | 0 refills | Status: DC
Start: 2024-04-06 — End: 2024-04-15

## 2024-04-06 NOTE — Telephone Encounter (Signed)
 I reviewed the patient's most recent culture results of their left calf wound, which were as follows     Susceptibility data from last 90 days.  Collected Specimen Info Organism Ampicillin CLINDAMYCIN Erythromycin  Gentamicin Gentamicin High Level Resistanc Levofloxacin  Oxacillin Penicillin Rifampin Streptomycin High Level Resista Tetracycline Trimethoprim  + Sulfamethoxazole  Vancomycin   04/01/24 Swab from Calf, Left Staphylococcus aureus   R  R  S   S  S   S   S  S  S     Enterococcus faecalis  S     S    S   S    S        CrCl cannot be calculated (Patient's most recent lab result is older than the maximum 30 days allowed.).    Dr. Orlan recommends the following interventions: Antibiotic prescription for Augmentin  875/125mg  BID x 10 days    I discussed this with the Patient and they agreed with the plan.   The patient will be seen at their next follow up appointment on 04/08/24 for further evaluation.

## 2024-04-08 ENCOUNTER — Ambulatory Visit: Attending: Foot & Ankle Surgery | Admitting: Foot & Ankle Surgery

## 2024-04-08 VITALS — BP 157/75 | HR 66 | Temp 97.5°F

## 2024-04-08 DIAGNOSIS — R600 Localized edema: Secondary | ICD-10-CM | POA: Insufficient documentation

## 2024-04-08 DIAGNOSIS — I890 Lymphedema, not elsewhere classified: Secondary | ICD-10-CM | POA: Insufficient documentation

## 2024-04-08 DIAGNOSIS — L97222 Non-pressure chronic ulcer of left calf with fat layer exposed: Secondary | ICD-10-CM | POA: Insufficient documentation

## 2024-04-08 NOTE — Progress Notes (Signed)
 [facility]ulatory [facility]  [facility]:  [address] [place], [place] [address]  T [phone]  F [phone]  Wound Follow-up - Nursing Documentation    PATIENT: [name] DOB: 08-11-1966   MR #: [id number]  AGE: 58 y.o.    REFERRING MD:  Elisabeth Lamar DASEN, MD PRIMARY MD: Elisabeth Lamar DASEN, MD      Chief Complaint   Patient presents with    Wound Check        Assessment & Plan     Case Management:  Patient identification verified using two identifiers.  This patient has been determined to be at risk for a fall, extra precautionary measures have been taken to ensure their safety.          Treatment:    POC: LLE: Dakin's soak , acticoat flex 7, unna boot, lidex to intact skin, and coban 2  RLE: urea cream to intact skin + coban 2 + pt to bring the compression stocking next week  RTC: 1 week      Procedures:  Additional Wound Procedures    Performed by: Babara Almarie PARAS, RN  Authorized by: Dorcas Charlie FALCON, MD  Associated wounds:   Wound 10/31/20 Edema/Edema Management Lower Leg Anterior;Right  Wound 10/31/20 Edema/Edema Management Lower Leg Anterior;Left    Body area:  Lower extremity  Lower extremity location:  R lower leg and L lower leg    Compression wrap type: multi-layer    Wrap location: bilateral    Wrap location equal debrided location?: yes    Debrided location: bilateral    Multi-layer: coban 2     Procedure was performed in a clean field. The leg was washed with soap and water and then patted dry. Moisturizer was applied to the leg. A multi-layer compression bandage was applied per manufacturer's instructions. Patient tolerated procedure well and without complaints of pain or discomfort. Patient was educated regarding signs and symptoms of overcompression, including unrelieved pain, toes turning red or purple, or numbness in foot.  Patient was instructed to manually remove outer layer (without scissors) and contact clinic immediately.       Patient Education:  If  wraps get wet/damaged, or if they're too tight, or if s/he has any questions: please call the [facility]. We are open M-F, 8:30-4:30, closed on weekends. If patient needs care during our off hours, please don't hesitate and go to Urgent Care or Emergency Room. Pt never to  use scissors to remove wraps if too tight. Patient verbalized understanding.      Almarie PARAS Babara, RN   04/08/2024

## 2024-04-08 NOTE — Progress Notes (Signed)
 [facility]    Wound Follow-Up - Provider Documentation    PATIENT: [name] DOB: 1966-08-20   MR #: [id number]  AGE: 58 y.o.    REFERRING MD:  Gregory Lamar DASEN, MD PRIMARY MD: Gregory Lamar DASEN, MD        Chief Complaint   Patient presents with    Wound Check      The patient was seen in the office today and a follow up evaluation was performed, including a history & focused exam.     [name] is a 58 year old black male who presents today with Patient reports he has had recurrent wounds and swelling of both lower legs for the past two years.  He was being treated at a wound center in [place] but moved to [place]  in July 2023.  He has been self treating with gauze/coban dressings and took a prescription for penicillin in January 2024.  PMH significant for morbid obesity, current tobacco user, hypertention, GERD and chronic back pain. Negative for diabetes.   The patient states his chief complaint is wounds of bilateral lower legs.    History of Present Illness   [name] is a 58 y.o. male who presents to [facility] wound healing center bilateral lower extremity lymphedema, edema and chronic left leg ulcers.  Patient denies fever, chills, nausea, vomiting, and leg pain.     Patient's right leg ulcer is healed however patient still have open left leg ulcer.  Patient has bilateral lower extremity lymphedema/leg edema.    Patient has been treated with Dakin's 10 minutes soaking, gentamicin, Zetuvit.  He is under multilayer compression for his leg for his leg swelling control.    Interval HPI 12/25/2023: Patient was lost in follow-up since November of last year.  Patient lost his transportation and was not able to come into wound center for follow-up until today.  Patient has been using zinc Coflex at home to control his leg swelling until he ran out of his supply.  Patient has been using Kerlix and Coban to manage his left leg ulcer and  swelling.    Interval HPI 01/01/2024: Patient was treated with Dakin's 10 minutes soaking, Aquacel Ag applied to his leg ulcer.  Patient was placed under Coflex compression on left and Coban to compression on right last week.  Patient stated that he received a phone call regarding antibiotic therapy per his leg wound infection however he has not started the medication yet and he will start today.    Interval HPI 01/08/2024: Patient was treated with Dakin's 10 minutes soaking, gentamicin, Aquacel Ag to his leg ulcer.  Patient is under Coflex compression to control his leg swelling.  Patient has been taking Levaquin  for last 2 days.  Patient is still taking Levaquin .    Interval HPI 01/22/2024: Patient was treated with Dakin's 10-minute soaking, gentamicin, Aquacel Ag to his leg ulcers.  Patient on the Coflex compression to control his leg swelling.  Patient has finished taking Levaquin .  Patient missed last week's appointment secondary to patient was unable to find parking at the hospital for over 1 hour.  Patient left due to unable to find a parking spot.    Interval HPI 01/29/2024: Patient was treated with Dakin's 10 minutes soaking, gentamicin, Aquacel Ag to his leg ulcers.  Patient is under Unna boot/Coban 2 modify compression to control his leg swelling.    Interval HPI 02/19/2024: Patient treated with Dakin's 10  minutes soaking soaking, gentamicin, Aquacel Ag to his leg ulcers.  Patient on multilayer compression to control his leg swelling.    Interval HPI 03/11/2024: Patient was treated with Dakin's 10 minutes soaking, Acticoat 7 to his left leg ulcers.  Patient is under Radio broadcast assistant multilayer compression to control his leg swelling.    Interval HPI 04/08/2024: Patient was treated with Dakin's 10 minutes soaking, Aquacel Ag to his left leg ulcer.  Patient is under Building services engineer 2 multilayer compression to his left leg and Coban 2 on right leg to control his leg swelling.    Past Medical History     Past Medical  History:   Diagnosis Date    GERD (gastroesophageal reflux disease)     Hypertension     Lower back pain        Past Surgical History   No past surgical history on file.    Family History   No family history on file.    Social History   Social History[1]    Allergies   Allergies[2]    Medications   Current Medications[3]    Physical Exam     Vitals:    04/08/24 1104   BP: 157/75   Pulse: 66   Temp: 97.5 F (36.4 C)       There is no height or weight on file to calculate BMI.    General:  Patient appears their stated age, well-nourished.  Alert and in no apparent distress.  Lungs: Respiratory effort unlabored, no signs of distress  Vascular: normal capillary refill  Skin: see focused wound care exam  Neuro: AAOx3 normal speech and appropriate affect, see wound care exam for sensory exam    Focused Wound Care Exam   Wound Assessment:   Wound 10/31/22 Lower Leg;Calf Left;Posterior (Active)   Date First Assessed: 10/31/22   Location: Lower Leg;Calf  Wound Location Orientation: Left;Posterior  Wound Description: lateral/posterior  Present on Original Admission: Yes      Assessments 04/08/2024 11:00 AM   Wound Image     Wound Base Description Granulation tissue;Full Thickness;Fibrin/slough;Scattered   Peri-wound Description Peeling/Flaking   Wound Length (cm) 6.6 cm   Wound Width (cm) 28.5 cm   Wound Depth (cm) 0.1 cm   Wound Surface Area (cm^2) 147.73 cm^2   Wound Volume (cm^3) 9.849 cm^3   Drainage Amount Moderate   Drainage Description Serosanguinous   Treatments Cleansed   Compression Wrap 1 Coban 2 or equivalent       Wound 10/31/20 Edema/Edema Management Lower Leg Anterior;Left (Active)   Date First Assessed/Time First Assessed: 10/31/20 0800   Primary Wound Type: Edema/Edema Management  Edema extremity: LLE  Edema Swelling: Pitting  Location: Lower Leg  Wound Location Orientation: Anterior;Left      Assessments 04/08/2024 11:10 AM   Wound Image         Wound 10/31/20 Edema/Edema Management Lower Leg Anterior;Right  (Active)   Date First Assessed/Time First Assessed: 10/31/20 0800   Primary Wound Type: Edema/Edema Management  Edema extremity: RLE  Edema Swelling: Pitting  Location: Lower Leg  Wound Location Orientation: Anterior;Right      Assessments 04/08/2024 11:10 AM   Wound Image         Edema  RLE Edema: Pitting; 3+ indentation 5-10 mm  R Foot Measurements(cm): 27 cm  R Ankle Measurements(cm): 26 cm  R Calf Measurements(cm): 50 cm  RLE Treatment: RLE Multilayer  RLE Multilayer Tx: Coban 2  LLE Edema: Pitting; 3+ indentation 5-10  mm  L Foot Measurements(cm): 26 cm  L Ankle Measurements(cm): 27.5 cm  L Calf Measurements(cm): 49 cm  LLE Treatment: LLE Multilayer  LLE Multilayer Tx: Coban 2 (unna boot + coban 2)        Procedure Note   No debridement needed    Procedures     Assessment & Plan   I examined the patient for an wound(s) that has been resistant to healing despite numerous interventions. The plan is to now examine any underlying characteristics that may impede healing. I decided that the wounds are not self limited and significantly impact the patient's daily living.    Wound Assessment and Plan:  The wounds are assessed as followed: Improving.    No sign of clinical infection however patient does have some worsening with increased drainage, wound cultures performed and sent to the lab.    After evaluation of the patient and ulcer/wound characteristics, I decided that debridement was indicated today.    Wound Care Plan:  Advanced Wound dressings appropriate to the needs of the wound have been initiated to provide a microenvironment which can enhance healing including maintaining a moist environment, decreasing bioburden and insulating and protecting the wound.     Dakin's 10 minutes soaking, Bactroban, Acticoat 7 applied  Unnaboot modified Coban2 to applied to left leg and zinc Coflex 2 applied to right leg.    Vascular Status:   The patient's vascular status was assessed and it was determined that their vascular  status is adequate for healing.     Edema Management:    The patient has edema to Bilateral lower extremities.  I reviewed the leg, ankle and foot circumference measurements to assess the patient's response to compression. The edema is +3 pitting edema. Edema control is imperative for healing.  Patient instructed to elevate the lower extremities above the level of the heart when possible.  Active compression will be used to gain control of the patient's edema. Edema managed through use of Coban 2. I described the possible complications compression wraps and told the patient to remove them if they felt too tight or became soiled, but otherwise they will leave the wraps in place until the next visit..     Nutrition:    Nutrition status is adequate.  I made suggestions regarding appropriate protein intake, vitamin supplementation, and adequate hydration. Recommendations include healthy diet    Other comorbidities that may affect wound healing: Non-healing wounds are a symptom of their significant comorbid diseases. edema and lymphedema    Coordination of Care: I recommended none needed at this time    Patient Education:  Dressing management. S/S infection and to call clinic/ED precautions if any were to occur.   Patient verbalized understanding and agreed with POC.   Home health was not indicated at this time..    The patient will continue to be seen on a regular basis.  Follow up in clinic in 1 week and PRN interval nurse visits for dressing changes.    Roosvelt Needle, DPM  04/08/2024          [1]   Social History  Socioeconomic History    Marital status: Married   Tobacco Use    Smoking status: Heavy Smoker     Current packs/day: 1.00     Types: Cigarettes    Smokeless tobacco: Current   Substance and Sexual Activity    Alcohol use: Yes     Comment: occasionally     Drug use: Yes  Comment: marijuana     Social Drivers of Health     Food Insecurity: Unknown (11/07/2022)    Hunger Vital Sign     Worried About Running  Out of Food in the Last Year: Never true    Received from [employer]    Social Network    Received from [employer]    HITS   [2] No Known Allergies  [3]   Current Outpatient Medications:     albuterol  sulfate HFA (PROVENTIL ) 108 (90 Base) MCG/ACT inhaler, Inhale 2 puffs into the lungs every 4 (four) hours as needed for Wheezing or Shortness of Breath (coughing) Dispense with spacer, Disp: 6.7 g, Rfl: 0    amlodipine-benazepril (LOTREL) 10-20 MG per capsule, Take 1 capsule by mouth daily, Disp: , Rfl:     amoxicillin -clavulanate (AUGMENTIN ) 875-125 MG per tablet, Take 1 tablet by mouth 2 (two) times daily for 10 days, Disp: 20 tablet, Rfl: 0    benzonatate  (TESSALON ) 100 MG capsule, Take 1 capsule (100 mg) by mouth 3 (three) times daily as needed for Cough, Disp: 15 capsule, Rfl: 0    candesartan (ATACAND) 16 MG tablet, Take 1 tablet (16 mg) by mouth 2 (two) times daily, Disp: , Rfl:     carvedilol (COREG) 12.5 MG tablet, Take 1 tablet (12.5 mg) by mouth nightly, Disp: , Rfl:     carvedilol (COREG) 25 MG tablet, Take 1 tablet (25 mg) by mouth daily In the morning, Disp: , Rfl:     cloNIDine (CATAPRES) 0.3 MG tablet, Take 1 tablet (0.3 mg) by mouth 2 (two) times daily, Disp: , Rfl:     furosemide (LASIX) 40 MG tablet, Take 1 tablet (40 mg) by mouth 2 (two) times daily, Disp: , Rfl:     hydrALAZINE (APRESOLINE) 50 MG tablet, Take 1 tablet (50 mg) by mouth 2 (two) times daily, Disp: , Rfl:     hydrochlorothiazide (HYDRODIURIL) 25 MG tablet, Take 1 tablet (25 mg) by mouth daily, Disp: , Rfl:     naproxen  (NAPROSYN ) 500 MG tablet, Take 1 tablet (500 mg) by mouth 2 (two) times daily as needed, Disp: , Rfl:     omeprazole (PRILOSEC) 40 MG capsule, Take 1 capsule (40 mg) by mouth daily, Disp: , Rfl:     potassium chloride (KLOR-CON M20) 20 MEQ CR tablet, Take 1 tablet (20 mEq) by mouth once daily, Disp: , Rfl:

## 2024-04-15 ENCOUNTER — Ambulatory Visit: Admitting: Foot & Ankle Surgery

## 2024-04-15 VITALS — BP 132/88 | HR 63 | Temp 97.5°F

## 2024-04-15 DIAGNOSIS — B9561 Methicillin susceptible Staphylococcus aureus infection as the cause of diseases classified elsewhere: Secondary | ICD-10-CM | POA: Insufficient documentation

## 2024-04-15 DIAGNOSIS — I890 Lymphedema, not elsewhere classified: Secondary | ICD-10-CM | POA: Insufficient documentation

## 2024-04-15 DIAGNOSIS — L97221 Non-pressure chronic ulcer of left calf limited to breakdown of skin: Secondary | ICD-10-CM | POA: Insufficient documentation

## 2024-04-15 DIAGNOSIS — F17210 Nicotine dependence, cigarettes, uncomplicated: Secondary | ICD-10-CM | POA: Insufficient documentation

## 2024-04-15 DIAGNOSIS — B952 Enterococcus as the cause of diseases classified elsewhere: Secondary | ICD-10-CM | POA: Insufficient documentation

## 2024-04-15 DIAGNOSIS — L97222 Non-pressure chronic ulcer of left calf with fat layer exposed: Secondary | ICD-10-CM

## 2024-04-15 DIAGNOSIS — R600 Localized edema: Secondary | ICD-10-CM

## 2024-04-15 MED ORDER — AMOXICILLIN-POT CLAVULANATE 875-125 MG PO TABS
1.0000 | ORAL_TABLET | Freq: Two times a day (BID) | ORAL | 0 refills | Status: AC
Start: 2024-04-15 — End: 2024-04-25

## 2024-04-15 NOTE — Progress Notes (Signed)
 [facility]    Wound Follow-Up - Provider Documentation    PATIENT: [name] DOB: 15-May-1966   MR #: [id number]  AGE: 58 y.o.    REFERRING MD:  Elisabeth Lamar DASEN, MD PRIMARY MD: Elisabeth Lamar DASEN, MD        Chief Complaint   Patient presents with    Edema      The patient was seen in the office today and a follow up evaluation was performed, including a history & focused exam.     [name] is a 58 year old black male who presents today with Patient reports he has had recurrent wounds and swelling of both lower legs for the past two years.  He was being treated at a wound center in [place] but moved to [place]  in July 2023.  He has been self treating with gauze/coban dressings and took a prescription for penicillin in January 2024.  PMH significant for morbid obesity, current tobacco user, hypertention, GERD and chronic back pain. Negative for diabetes.   The patient states his chief complaint is wounds of bilateral lower legs.    History of Present Illness   [name] is a 58 y.o. male who presents to [facility] wound healing center bilateral lower extremity lymphedema, edema and chronic left leg ulcers.  Patient denies fever, chills, nausea, vomiting, and leg pain.     Patient's right leg ulcer is healed however patient still have open left leg ulcer.  Patient has bilateral lower extremity lymphedema/leg edema.    Patient has been treated with Dakin's 10 minutes soaking, gentamicin, Zetuvit.  He is under multilayer compression for his leg for his leg swelling control.    Interval HPI 12/25/2023: Patient was lost in follow-up since November of last year.  Patient lost his transportation and was not able to come into wound center for follow-up until today.  Patient has been using zinc Coflex at home to control his leg swelling until he ran out of his supply.  Patient has been using Kerlix and Coban to manage his left leg ulcer and  swelling.    Interval HPI 01/01/2024: Patient was treated with Dakin's 10 minutes soaking, Aquacel Ag applied to his leg ulcer.  Patient was placed under Coflex compression on left and Coban to compression on right last week.  Patient stated that he received a phone call regarding antibiotic therapy per his leg wound infection however he has not started the medication yet and he will start today.    Interval HPI 01/08/2024: Patient was treated with Dakin's 10 minutes soaking, gentamicin, Aquacel Ag to his leg ulcer.  Patient is under Coflex compression to control his leg swelling.  Patient has been taking Levaquin  for last 2 days.  Patient is still taking Levaquin .    Interval HPI 01/22/2024: Patient was treated with Dakin's 10-minute soaking, gentamicin, Aquacel Ag to his leg ulcers.  Patient on the Coflex compression to control his leg swelling.  Patient has finished taking Levaquin .  Patient missed last week's appointment secondary to patient was unable to find parking at the hospital for over 1 hour.  Patient left due to unable to find a parking spot.    Interval HPI 01/29/2024: Patient was treated with Dakin's 10 minutes soaking, gentamicin, Aquacel Ag to his leg ulcers.  Patient is under Unna boot/Coban 2 modify compression to control his leg swelling.    Interval HPI 02/19/2024: Patient treated with Dakin's 10 minutes  soaking soaking, gentamicin, Aquacel Ag to his leg ulcers.  Patient on multilayer compression to control his leg swelling.    Interval HPI 03/11/2024: Patient was treated with Dakin's 10 minutes soaking, Acticoat 7 to his left leg ulcers.  Patient is under Radio broadcast assistant multilayer compression to control his leg swelling.    Interval HPI 04/08/2024: Patient was treated with Dakin's 10 minutes soaking, Aquacel Ag to his left leg ulcer.  Patient is under Building services engineer 2 multilayer compression to his left leg and Coban 2 on right leg to control his leg swelling.    Past Medical History     Past Medical  History:   Diagnosis Date    GERD (gastroesophageal reflux disease)     Hypertension     Lower back pain        Past Surgical History   No past surgical history on file.    Family History   No family history on file.    Social History   Social History[1]    Allergies   Allergies[2]    Medications   Current Medications[3]    Physical Exam     Vitals:    04/15/24 1054   BP: 132/88   Pulse: 63   Temp: 97.5 F (36.4 C)       There is no height or weight on file to calculate BMI.    General:  Patient appears their stated age, well-nourished.  Alert and in no apparent distress.  Lungs: Respiratory effort unlabored, no signs of distress  Vascular: normal capillary refill  Skin: see focused wound care exam  Neuro: AAOx3 normal speech and appropriate affect, see wound care exam for sensory exam    Focused Wound Care Exam   Wound Assessment:   Wound 10/31/22 Lower Leg;Calf Left;Posterior (Active)   Date First Assessed: 10/31/22   Location: Lower Leg;Calf  Wound Location Orientation: Left;Posterior  Wound Description: lateral/posterior  Present on Original Admission: Yes      Assessments 04/15/2024 11:08 AM   Wound Image      Wound Base Description Scattered;Pink   Peri-wound Description Dry;Peeling/Flaking   Wound Length (cm) 5 cm   Wound Width (cm) 4.5 cm   Wound Depth (cm) 0.1 cm   Wound Surface Area (cm^2) 17.67 cm^2   Wound Volume (cm^3) 1.178 cm^3   Closure Open   Drainage Amount Small   Drainage Description Serous   Margins Open;Undefined edges   Treatments Cleansed;Compression Wrap1   Compression Wrap 1 Coban 2 or equivalent   Topical Dakins Solution       Wound 10/31/20 Edema/Edema Management Lower Leg Anterior;Left (Active)   Date First Assessed/Time First Assessed: 10/31/20 0800   Primary Wound Type: Edema/Edema Management  Edema extremity: LLE  Edema Swelling: Pitting  Location: Lower Leg  Wound Location Orientation: Anterior;Left      Assessments 04/15/2024 11:07 AM   Wound Image     Treatments Cleansed;Compression  Tubular Sleeve   Compression Wrap 1 Coban 2 or equivalent       Wound 10/31/20 Edema/Edema Management Lower Leg Anterior;Right (Active)   Date First Assessed/Time First Assessed: 10/31/20 0800   Primary Wound Type: Edema/Edema Management  Edema extremity: RLE  Edema Swelling: Pitting  Location: Lower Leg  Wound Location Orientation: Anterior;Right      Assessments 04/15/2024 11:07 AM   Wound Image     Treatments Cleansed;Compression Wrap1   Compression Wrap 1 Coban 2 or equivalent   Topical Moisturizing cream  Edema  RLE Edema: Pitting; 2+ indentation < 5mm; Brawny  R Foot Measurements(cm): 27 cm  R Ankle Measurements(cm): 27.5 cm  R Calf Measurements(cm): 50 cm  LLE Edema: Pitting; 2+ indentation < 5mm; Brawny  L Foot Measurements(cm): 27 cm  L Ankle Measurements(cm): 27.5 cm  L Calf Measurements(cm): 49 cm        Procedure Note   No debridement needed    Procedures     Assessment & Plan   I examined the patient for an wound(s) that has been resistant to healing despite numerous interventions. The plan is to now examine any underlying characteristics that may impede healing. I decided that the wounds are not self limited and significantly impact the patient's daily living.    Wound Assessment and Plan:  The wounds are assessed as followed: Improving.    Patient's wound culture result reviewed with patient.  Light growth of Staph aureus, moderate growth of Enterococcus faecalis.  Continue with Augmentin  875 mg x 10 days was given to patient on July 21.  Will renew his Augmentin  for 10 more days.    After evaluation of the patient and ulcer/wound characteristics, I decided that debridement was indicated today.    Wound Care Plan:  Advanced Wound dressings appropriate to the needs of the wound have been initiated to provide a microenvironment which can enhance healing including maintaining a moist environment, decreasing bioburden and insulating and protecting the wound.     Dakin's 10 minutes soaking, Bactroban,  Acticoat 7 applied  Unnaboot modified Coban2 to applied to left leg and zinc Coflex 2 applied to right leg.    Vascular Status:   The patient's vascular status was assessed and it was determined that their vascular status is adequate for healing.     Edema Management:    The patient has edema to Bilateral lower extremities.  I reviewed the leg, ankle and foot circumference measurements to assess the patient's response to compression. The edema is +3 pitting edema. Edema control is imperative for healing.  Patient instructed to elevate the lower extremities above the level of the heart when possible.  Active compression will be used to gain control of the patient's edema. Edema managed through use of Coban 2. I described the possible complications compression wraps and told the patient to remove them if they felt too tight or became soiled, but otherwise they will leave the wraps in place until the next visit..     Nutrition:    Nutrition status is adequate.  I made suggestions regarding appropriate protein intake, vitamin supplementation, and adequate hydration. Recommendations include healthy diet    Other comorbidities that may affect wound healing: Non-healing wounds are a symptom of their significant comorbid diseases. edema and lymphedema    Coordination of Care: I recommended none needed at this time    Patient Education:  Dressing management. S/S infection and to call clinic/ED precautions if any were to occur.   Patient verbalized understanding and agreed with POC.   Home health was not indicated at this time..    The patient will continue to be seen on a regular basis.  Follow up in clinic in 1 week and PRN interval nurse visits for dressing changes.    Roosvelt Needle, DPM  04/15/2024          [1]   Social History  Socioeconomic History    Marital status: Married   Tobacco Use    Smoking status: Heavy Smoker     Current  packs/day: 1.00     Types: Cigarettes    Smokeless tobacco: Current   Substance and Sexual  Activity    Alcohol use: Yes     Comment: occasionally     Drug use: Yes     Comment: marijuana     Social Drivers of Health     Food Insecurity: Unknown (11/07/2022)    Hunger Vital Sign     Worried About Running Out of Food in the Last Year: Never true    Received from [employer]    Social Network    Received from [employer]    HITS   [2] No Known Allergies  [3]   Current Outpatient Medications:     albuterol  sulfate HFA (PROVENTIL ) 108 (90 Base) MCG/ACT inhaler, Inhale 2 puffs into the lungs every 4 (four) hours as needed for Wheezing or Shortness of Breath (coughing) Dispense with spacer, Disp: 6.7 g, Rfl: 0    amlodipine-benazepril (LOTREL) 10-20 MG per capsule, Take 1 capsule by mouth daily, Disp: , Rfl:     benzonatate  (TESSALON ) 100 MG capsule, Take 1 capsule (100 mg) by mouth 3 (three) times daily as needed for Cough, Disp: 15 capsule, Rfl: 0    candesartan (ATACAND) 16 MG tablet, Take 1 tablet (16 mg) by mouth 2 (two) times daily, Disp: , Rfl:     carvedilol (COREG) 12.5 MG tablet, Take 1 tablet (12.5 mg) by mouth nightly, Disp: , Rfl:     carvedilol (COREG) 25 MG tablet, Take 1 tablet (25 mg) by mouth daily In the morning, Disp: , Rfl:     cloNIDine (CATAPRES) 0.3 MG tablet, Take 1 tablet (0.3 mg) by mouth 2 (two) times daily, Disp: , Rfl:     furosemide (LASIX) 40 MG tablet, Take 1 tablet (40 mg) by mouth 2 (two) times daily, Disp: , Rfl:     hydrALAZINE (APRESOLINE) 50 MG tablet, Take 1 tablet (50 mg) by mouth 2 (two) times daily, Disp: , Rfl:     hydrochlorothiazide (HYDRODIURIL) 25 MG tablet, Take 1 tablet (25 mg) by mouth daily, Disp: , Rfl:     naproxen  (NAPROSYN ) 500 MG tablet, Take 1 tablet (500 mg) by mouth 2 (two) times daily as needed, Disp: , Rfl:     omeprazole (PRILOSEC) 40 MG capsule, Take 1 capsule (40 mg) by mouth daily, Disp: , Rfl:     potassium chloride (KLOR-CON M20) 20 MEQ CR tablet, Take 1 tablet (20 mEq) by mouth once daily, Disp: , Rfl:     amoxicillin -clavulanate (AUGMENTIN )  875-125 MG per tablet, Take 1 tablet by mouth 2 (two) times daily for 10 days, Disp: 20 tablet, Rfl: 0

## 2024-04-15 NOTE — Progress Notes (Signed)
 Ambulatory [facility]  [facility]:  [address], [place], [place] [address]  T [phone]  F [phone]  Wound Follow-up - Nursing Documentation    PATIENT: [name] DOB: 1966-04-02   MR #: [id number]  AGE: 58 y.o.    REFERRING MD:  Elisabeth Lamar DASEN, MD PRIMARY MD: Elisabeth Lamar DASEN, MD      Chief Complaint   Patient presents with    Edema        Assessment & Plan   Patient arrived in the wound center on a wheel chair. RN reconciled meds and allergies. PO antibiotics prescribed by Provider    Case Management:  Patient identification verified using two identifiers.  This patient has been determined to be at risk for a fall, extra precautionary measures have been taken to ensure their safety.        Treatment:    POC: LLE: Dakin's soak , acticoat flex 7, unna boot, lidex to intact skin, and coban 2  RLE: urea cream to intact skin + coban 2 + pt to bring the compression stocking next week    RTC: 1 week    Additional Wound Procedures    Performed by: Blima Jaimes IV V, RN  Authorized by: Orlan Munch, DPM  Associated wounds:   Wound 10/31/22 Lower Leg;Calf Left;Posterior  Wound 10/31/20 Edema/Edema Management Lower Leg Anterior;Left  Wound 10/31/20 Edema/Edema Management Lower Leg Anterior;Right    Body area:  Lower extremity  Lower extremity location:  R lower leg and L lower leg    Compression wrap type: multi-layer, unna boot    Wrap location: bilateral    Wrap location equal debrided location?: no    Multi-layer: 2 layer     Procedure was performed in a clean field. The leg was washed with soap and water and then patted dry. Moisturizer was applied to the leg. A multi-layer compression bandage was applied per manufacturer's instructions. Patient tolerated procedure well and without complaints of pain or discomfort. Patient was educated regarding signs and symptoms of overcompression, including unrelieved pain, toes turning red or purple, or numbness in foot.   Patient was instructed to manually remove outer layer (without scissors) and contact clinic immediately.       Patient Education:  If wraps get wet/damaged, or if they're too tight, or if s/he has any questions: please call the [facility]. We are open M-F, 8:30-4:30, closed on weekends. If patient needs care during our off hours, please don't hesitate and go to Urgent Care or Emergency Room. Pt never to  use scissors to remove wraps if too tight. Patient verbalized understanding.      Makena Mcgrady IV LULLA Ket, RN   04/15/2024

## 2024-04-22 ENCOUNTER — Ambulatory Visit: Attending: Foot & Ankle Surgery | Admitting: Foot & Ankle Surgery

## 2024-04-22 VITALS — BP 121/74 | HR 73 | Temp 98.0°F | Resp 15 | Ht 76.0 in | Wt >= 6400 oz

## 2024-04-22 DIAGNOSIS — F17210 Nicotine dependence, cigarettes, uncomplicated: Secondary | ICD-10-CM | POA: Insufficient documentation

## 2024-04-22 DIAGNOSIS — I890 Lymphedema, not elsewhere classified: Secondary | ICD-10-CM | POA: Insufficient documentation

## 2024-04-22 DIAGNOSIS — L97222 Non-pressure chronic ulcer of left calf with fat layer exposed: Secondary | ICD-10-CM

## 2024-04-22 DIAGNOSIS — R600 Localized edema: Secondary | ICD-10-CM

## 2024-04-22 DIAGNOSIS — L97219 Non-pressure chronic ulcer of right calf with unspecified severity: Secondary | ICD-10-CM

## 2024-04-22 DIAGNOSIS — L97221 Non-pressure chronic ulcer of left calf limited to breakdown of skin: Secondary | ICD-10-CM | POA: Insufficient documentation

## 2024-04-22 NOTE — Progress Notes (Signed)
 Ambulatory [place] [facility]    Wound Follow-Up - Provider Documentation    PATIENT: [name] DOB: 10-07-1965   MR #: [id number]  AGE: 58 y.o.    REFERRING MD:  Elisabeth Lamar DASEN, MD PRIMARY MD: Elisabeth Lamar DASEN, MD        Chief Complaint   Patient presents with    Chronic Ulcer    Edema      The patient was seen in the office today and a follow up evaluation was performed, including a history & focused exam.     [name] is a 58 year old black male who presents today with Patient reports he has had recurrent wounds and swelling of both lower legs for the past two years.  He was being treated at a wound center in [place] but moved to [place]  in July 2023.  He has been self treating with gauze/coban dressings and took a prescription for penicillin in January 2024.  PMH significant for morbid obesity, current tobacco user, hypertention, GERD and chronic back pain. Negative for diabetes.   The patient states his chief complaint is wounds of bilateral lower legs.    History of Present Illness   [name] is a 58 y.o. male who presents to [facility] wound healing center bilateral lower extremity lymphedema, edema and chronic left leg ulcers.  Patient denies fever, chills, nausea, vomiting, and leg pain.     Patient's right leg ulcer is healed however patient still have open left leg ulcer.  Patient has bilateral lower extremity lymphedema/leg edema.    Patient has been treated with Dakin's 10 minutes soaking, gentamicin, Zetuvit.  He is under multilayer compression for his leg for his leg swelling control.    Interval HPI 12/25/2023: Patient was lost in follow-up since November of last year.  Patient lost his transportation and was not able to come into wound center for follow-up until today.  Patient has been using zinc Coflex at home to control his leg swelling until he ran out of his supply.  Patient has been using Kerlix and Coban to manage his left leg  ulcer and swelling.    Interval HPI 01/01/2024: Patient was treated with Dakin's 10 minutes soaking, Aquacel Ag applied to his leg ulcer.  Patient was placed under Coflex compression on left and Coban to compression on right last week.  Patient stated that he received a phone call regarding antibiotic therapy per his leg wound infection however he has not started the medication yet and he will start today.    Interval HPI 01/08/2024: Patient was treated with Dakin's 10 minutes soaking, gentamicin, Aquacel Ag to his leg ulcer.  Patient is under Coflex compression to control his leg swelling.  Patient has been taking Levaquin  for last 2 days.  Patient is still taking Levaquin .    Interval HPI 01/22/2024: Patient was treated with Dakin's 10-minute soaking, gentamicin, Aquacel Ag to his leg ulcers.  Patient on the Coflex compression to control his leg swelling.  Patient has finished taking Levaquin .  Patient missed last week's appointment secondary to patient was unable to find parking at the hospital for over 1 hour.  Patient left due to unable to find a parking spot.    Interval HPI 01/29/2024: Patient was treated with Dakin's 10 minutes soaking, gentamicin, Aquacel Ag to his leg ulcers.  Patient is under Unna boot/Coban 2 modify compression to control his leg swelling.    Interval HPI 02/19/2024: Patient  treated with Dakin's 10 minutes soaking soaking, gentamicin, Aquacel Ag to his leg ulcers.  Patient on multilayer compression to control his leg swelling.    Interval HPI 03/11/2024: Patient was treated with Dakin's 10 minutes soaking, Acticoat 7 to his left leg ulcers.  Patient is under Radio broadcast assistant multilayer compression to control his leg swelling.    Interval HPI 04/08/2024: Patient was treated with Dakin's 10 minutes soaking, Aquacel Ag to his left leg ulcer.  Patient is under Building services engineer 2 multilayer compression to his left leg and Coban 2 on right leg to control his leg swelling.    Interval HPI 04/22/2024: Patient  treated with Dakin's 10-minute soaking, Bactroban, Acticoat 7.  Patient is under Radio broadcast assistant modified Coban 2 multilayer compression on his left leg and Coban 2 on right leg.    Past Medical History     Past Medical History:   Diagnosis Date    GERD (gastroesophageal reflux disease)     Hypertension     Lower back pain        Past Surgical History   History reviewed. No pertinent surgical history.    Family History   No family history on file.    Social History   Social History[1]    Allergies   Allergies[2]    Medications   Current Medications[3]    Physical Exam     Vitals:    04/22/24 1132   BP: 121/74   Pulse:    Resp:    Temp:    SpO2:          Body mass index is 54.78 kg/m.    General:  Patient appears their stated age, well-nourished.  Alert and in no apparent distress.  Lungs: Respiratory effort unlabored, no signs of distress  Vascular: normal capillary refill  Skin: see focused wound care exam  Neuro: AAOx3 normal speech and appropriate affect, see wound care exam for sensory exam    Focused Wound Care Exam   Wound Assessment:   Wound 10/31/22 Lower Leg;Calf Left;Posterior (Active)   Date First Assessed: 10/31/22   Location: Lower Leg;Calf  Wound Location Orientation: Left;Posterior  Wound Description: lateral/posterior  Present on Original Admission: Yes      Assessments 04/22/2024 11:17 AM   Wound Image     Wound Base Description Partial Thickness;Scattered   Peri-wound Description Intact   Shape scatter   Wound Length (cm) 2.7 cm   Wound Width (cm) 5.2 cm   Wound Depth (cm) 0.1 cm   Wound Surface Area (cm^2) 11.03 cm^2   Wound Volume (cm^3) 0.735 cm^3   Closure Open   Drainage Amount Small   Drainage Description Serosanguinous   Margins Defined edges   Treatments Cleansed   Topical Dakins Solution   Dressing Silver Impregnated Textile;Abdominal Dressing       Wound 10/31/20 Edema/Edema Management Lower Leg Anterior;Left (Active)   Date First Assessed/Time First Assessed: 10/31/20 0800   Primary Wound Type:  Edema/Edema Management  Edema extremity: LLE  Edema Swelling: Pitting  Location: Lower Leg  Wound Location Orientation: Anterior;Left      Assessments 04/22/2024 11:17 AM   Wound Image     Treatments Cleansed   Compression Wrap 1 Coban 2 or equivalent   Topical Other (Comment)       Wound 10/31/20 Edema/Edema Management Lower Leg Anterior;Right (Active)   Date First Assessed/Time First Assessed: 10/31/20 0800   Primary Wound Type: Edema/Edema Management  Edema extremity: RLE  Edema Swelling: Pitting  Location: Lower Leg  Wound Location Orientation: Anterior;Right      Assessments 04/22/2024 11:17 AM   Wound Image     Treatments Cleansed   Compression Wrap 1 Coban 2 or equivalent   Topical Other (Comment)         Edema  RLE Edema: Pitting; 2+ indentation < 5mm  R Foot Measurements(cm): 27.5 cm  R Ankle Measurements(cm): 27 cm  R Calf Measurements(cm): 53 cm  RLE Treatment: RLE Multilayer  RLE Multilayer Tx: Coban 2  LLE Edema: Pitting; 2+ indentation < 5mm  L Foot Measurements(cm): 27 cm  L Ankle Measurements(cm): 27 cm  L Calf Measurements(cm): 51 cm  LLE Treatment: LLE Multilayer  LLE Multilayer Tx: Coban 2          Procedure Note   No debridement needed    Procedures     Assessment & Plan   I examined the patient for an wound(s) that has been resistant to healing despite numerous interventions. The plan is to now examine any underlying characteristics that may impede healing. I decided that the wounds are not self limited and significantly impact the patient's daily living.    Wound Assessment and Plan:  The wounds are assessed as followed: Improving.    Patient's wound culture result reviewed with patient.  Light growth of Staph aureus, moderate growth of Enterococcus faecalis.  Continue with Augmentin  875 mg x 10 days was given to patient on July 21.      After evaluation of the patient and ulcer/wound characteristics, I decided that debridement was indicated today.    Wound Care Plan:  Advanced Wound dressings  appropriate to the needs of the wound have been initiated to provide a microenvironment which can enhance healing including maintaining a moist environment, decreasing bioburden and insulating and protecting the wound.     Dakin's 10 minutes soaking, Bactroban, Acticoat 7 applied  Unnaboot modified Coban2 to applied to left leg and zinc Coflex 2 applied to right leg.    Vascular Status:   The patient's vascular status was assessed and it was determined that their vascular status is adequate for healing.     Edema Management:    The patient has edema to Bilateral lower extremities.  I reviewed the leg, ankle and foot circumference measurements to assess the patient's response to compression. The edema is +3 pitting edema. Edema control is imperative for healing.  Patient instructed to elevate the lower extremities above the level of the heart when possible.  Active compression will be used to gain control of the patient's edema. Edema managed through use of Coban 2. I described the possible complications compression wraps and told the patient to remove them if they felt too tight or became soiled, but otherwise they will leave the wraps in place until the next visit..     Nutrition:    Nutrition status is adequate.  I made suggestions regarding appropriate protein intake, vitamin supplementation, and adequate hydration. Recommendations include healthy diet    Other comorbidities that may affect wound healing: Non-healing wounds are a symptom of their significant comorbid diseases. edema and lymphedema    Coordination of Care: I recommended none needed at this time    Patient Education:  Dressing management. S/S infection and to call clinic/ED precautions if any were to occur.   Patient verbalized understanding and agreed with POC.   Home health was not indicated at this time..    The patient will continue to be seen on a regular  basis.  Follow up in clinic in 1 week and PRN interval nurse visits for dressing  changes.    Roosvelt Needle, DPM  04/22/2024          [1]   Social History  Socioeconomic History    Marital status: Married   Tobacco Use    Smoking status: Heavy Smoker     Current packs/day: 1.00     Types: Cigarettes    Smokeless tobacco: Current   Substance and Sexual Activity    Alcohol use: Yes     Comment: occasionally     Drug use: Yes     Comment: marijuana     Social Drivers of Health     Food Insecurity: Unknown (11/07/2022)    Hunger Vital Sign     Worried About Running Out of Food in the Last Year: Never true    Received from [employer]    Social Network    Received from [employer]    HITS   [2] No Known Allergies  [3]   Current Outpatient Medications:     albuterol  sulfate HFA (PROVENTIL ) 108 (90 Base) MCG/ACT inhaler, Inhale 2 puffs into the lungs every 4 (four) hours as needed for Wheezing or Shortness of Breath (coughing) Dispense with spacer, Disp: 6.7 g, Rfl: 0    amlodipine-benazepril (LOTREL) 10-20 MG per capsule, Take 1 capsule by mouth daily, Disp: , Rfl:     amoxicillin -clavulanate (AUGMENTIN ) 875-125 MG per tablet, Take 1 tablet by mouth 2 (two) times daily for 10 days, Disp: 20 tablet, Rfl: 0    benzonatate  (TESSALON ) 100 MG capsule, Take 1 capsule (100 mg) by mouth 3 (three) times daily as needed for Cough, Disp: 15 capsule, Rfl: 0    candesartan (ATACAND) 16 MG tablet, Take 1 tablet (16 mg) by mouth 2 (two) times daily, Disp: , Rfl:     carvedilol (COREG) 12.5 MG tablet, Take 1 tablet (12.5 mg) by mouth nightly, Disp: , Rfl:     carvedilol (COREG) 25 MG tablet, Take 1 tablet (25 mg) by mouth daily In the morning, Disp: , Rfl:     cloNIDine (CATAPRES) 0.3 MG tablet, Take 1 tablet (0.3 mg) by mouth 2 (two) times daily, Disp: , Rfl:     furosemide (LASIX) 40 MG tablet, Take 1 tablet (40 mg) by mouth 2 (two) times daily, Disp: , Rfl:     hydrALAZINE (APRESOLINE) 50 MG tablet, Take 1 tablet (50 mg) by mouth 2 (two) times daily, Disp: , Rfl:     hydrochlorothiazide (HYDRODIURIL) 25 MG tablet,  Take 1 tablet (25 mg) by mouth daily, Disp: , Rfl:     naproxen  (NAPROSYN ) 500 MG tablet, Take 1 tablet (500 mg) by mouth 2 (two) times daily as needed, Disp: , Rfl:     omeprazole (PRILOSEC) 40 MG capsule, Take 1 capsule (40 mg) by mouth daily, Disp: , Rfl:     potassium chloride (KLOR-CON M20) 20 MEQ CR tablet, Take 1 tablet (20 mEq) by mouth once daily, Disp: , Rfl:

## 2024-04-22 NOTE — Progress Notes (Signed)
 Ambulatory [place] [facility]  [facility]:  [address], [place], [place] [address]  T [phone]  F [phone]  Wound Follow-up - Nursing Documentation    PATIENT: [name] DOB: 21-Oct-1965   MR #: [id number]  AGE: 58 y.o.    REFERRING MD:  Elisabeth Lamar DASEN, MD PRIMARY MD: Elisabeth Lamar DASEN, MD      Chief Complaint   Patient presents with    Chronic Ulcer    Edema        Assessment & Plan     Case Management:  Patient identification verified using two identifiers.  This patient has been determined to be at risk for a fall, extra precautionary measures have been taken to ensure their safety.    Pt reports pain from toothache, currently searching for dentist and awaiting his BP to stabilize.     Treatment:  LLE: Dakin's soak , Acticoat flex 7, unna boot, lidex + moisturizer, abd to bolster and coban 2  RLE: Urea cream + moisturizer to intact skin, abd to bolster, coban 2  No changes until next clinic visit  RTC: 1 week    Procedures:  Additional Wound Procedures    Performed by: Jackson Aquas, RN  Authorized by: Orlan Munch, DPM  Associated wounds:   Wound 10/31/20 Edema/Edema Management Lower Leg Anterior;Left  Wound 10/31/20 Edema/Edema Management Lower Leg Anterior;Right    Body area:  Lower extremity  Lower extremity location:  R lower leg and L lower leg    Compression wrap type: multi-layer    Wrap location: bilateral    Wrap location equal debrided location?: no    Multi-layer: coban 2     Procedure was performed in a clean field. The leg was washed with soap and water and then patted dry. Moisturizer was applied to the leg. A multi-layer compression bandage was applied per manufacturer's instructions. Patient tolerated procedure well and without complaints of pain or discomfort. Patient was educated regarding signs and symptoms of overcompression, including unrelieved pain, toes turning red or purple, or numbness in foot.  Patient was instructed to manually remove outer  layer (without scissors) and contact clinic immediately.       Patient Education:  -pt to bring the compression stocking next week  -keep dressings dry and intact, use cast cover if showering careful to sit while showering not stand  Patient verbalized understanding.     Aquas [place], RN   04/22/2024

## 2024-04-29 ENCOUNTER — Ambulatory Visit

## 2024-05-06 ENCOUNTER — Ambulatory Visit: Admitting: Foot & Ankle Surgery

## 2024-05-06 VITALS — BP 145/83 | HR 67 | Temp 97.2°F

## 2024-05-06 DIAGNOSIS — L97222 Non-pressure chronic ulcer of left calf with fat layer exposed: Secondary | ICD-10-CM | POA: Insufficient documentation

## 2024-05-06 DIAGNOSIS — I10 Essential (primary) hypertension: Secondary | ICD-10-CM | POA: Insufficient documentation

## 2024-05-06 DIAGNOSIS — I890 Lymphedema, not elsewhere classified: Secondary | ICD-10-CM | POA: Insufficient documentation

## 2024-05-06 DIAGNOSIS — F17200 Nicotine dependence, unspecified, uncomplicated: Secondary | ICD-10-CM | POA: Insufficient documentation

## 2024-05-06 DIAGNOSIS — R600 Localized edema: Secondary | ICD-10-CM

## 2024-05-06 NOTE — Progress Notes (Signed)
 [facility]ulatory [facility]    Wound Follow-Up - Provider Documentation    PATIENT: [name] DOB: [birth year]   MR #: [id number]  AGE: 58 y.o.    REFERRING MD:  Elisabeth Lamar DASEN, MD PRIMARY MD: Elisabeth Lamar DASEN, MD        Chief Complaint   Patient presents with    Edema      The patient was seen in the office today and a follow up evaluation was performed, including a history & focused exam.     [name] is a 58 year old black male who presents today with Patient reports he has had recurrent wounds and swelling of both lower legs for the past two years.  He was being treated at a wound center in [place] but moved to [place]  in July 2023.  He has been self treating with gauze/coban dressings and took a prescription for penicillin in January 2024.  PMH significant for morbid obesity, current tobacco user, hypertention, GERD and chronic back pain. Negative for diabetes.   The patient states his chief complaint is wounds of bilateral lower legs.    History of Present Illness   [name] is a 58 y.o. male who presents to [facility] wound healing center bilateral lower extremity lymphedema, edema and chronic left leg ulcers.  Patient denies fever, chills, nausea, vomiting, and leg pain.     Patient's right leg ulcer is healed however patient still have open left leg ulcer.  Patient has bilateral lower extremity lymphedema/leg edema.    Patient has been treated with Dakin's 10 minutes soaking, gentamicin, Zetuvit.  He is under multilayer compression for his leg for his leg swelling control.    Interval HPI 12/25/2023: Patient was lost in follow-up since November of last year.  Patient lost his transportation and was not able to come into wound center for follow-up until today.  Patient has been using zinc Coflex at home to control his leg swelling until he ran out of his supply.  Patient has been using Kerlix and Coban to manage his left leg ulcer and  swelling.    Interval HPI 04/22/2024: Patient treated with Dakin's 10-minute soaking, Bactroban, Acticoat 7.  Patient is under Radio broadcast assistant modified Coban 2 multilayer compression on his left leg and Coban 2 on right leg.    Interval HPI 05/06/2024: Patient was treated with Dakin's 10 minutes soaking, Bactroban, Acticoat 7.  Patient is under actual wound boot modified Coban 2 multilayer compression to left lower extremity to control his leg swelling and Coflex to right lower extremity.  Patient missed appointment last week secondary to a stomach virus.    Past Medical History     Past Medical History:   Diagnosis Date    GERD (gastroesophageal reflux disease)     Hypertension     Lower back pain        Past Surgical History   No past surgical history on file.    Family History   No family history on file.    Social History   Social History[1]    Allergies   Allergies[2]    Medications   Current Medications[3]    Physical Exam     Vitals:    05/06/24 1105   BP: 145/83   Pulse: 67   Temp: 97.2 F (36.2 C)         There is no height or weight on file to calculate BMI.  General:  Patient appears their stated age, well-nourished.  Alert and in no apparent distress.  Lungs: Respiratory effort unlabored, no signs of distress  Vascular: normal capillary refill  Skin: see focused wound care exam  Neuro: AAOx3 normal speech and appropriate affect, see wound care exam for sensory exam    Focused Wound Care Exam   Wound Assessment:   Wound 10/31/22 Lower Leg;Calf Left;Posterior (Active)   Date First Assessed: 10/31/22   Location: Lower Leg;Calf  Wound Location Orientation: Left;Posterior  Wound Description: lateral/posterior  Present on Original Admission: Yes      Assessments 05/06/2024 11:16 AM   Wound Image      Wound Length (cm) 15.2 cm   Wound Width (cm) 4.1 cm   Wound Depth (cm) 0.1 cm   Wound Surface Area (cm^2) 48.95 cm^2   Wound Volume (cm^3) 3.263 cm^3       Wound 10/31/20 Edema/Edema Management Lower Leg Anterior;Left  (Active)   Date First Assessed/Time First Assessed: 10/31/20 0800   Primary Wound Type: Edema/Edema Management  Edema extremity: LLE  Edema Swelling: Pitting  Location: Lower Leg  Wound Location Orientation: Anterior;Left      Assessments 05/06/2024 11:16 AM   Wound Image         Wound 10/31/20 Edema/Edema Management Lower Leg Anterior;Right (Active)   Date First Assessed/Time First Assessed: 10/31/20 0800   Primary Wound Type: Edema/Edema Management  Edema extremity: RLE  Edema Swelling: Pitting  Location: Lower Leg  Wound Location Orientation: Anterior;Right      Assessments 05/06/2024 11:16 AM   Wound Image           No data recorded        Procedure Note   No debridement needed    Procedures     Assessment & Plan   I examined the patient for an wound(s) that has been resistant to healing despite numerous interventions. The plan is to now examine any underlying characteristics that may impede healing. I decided that the wounds are not self limited and significantly impact the patient's daily living.    Wound Assessment and Plan:  The wounds are assessed as followed: Improving.    Patient's wound culture result reviewed with patient.  Light growth of Staph aureus, moderate growth of Enterococcus faecalis.  Continue with Augmentin  875 mg x 10 days was given to patient on July 21.      After evaluation of the patient and ulcer/wound characteristics, I decided that debridement was indicated today.    Wound Care Plan:  Advanced Wound dressings appropriate to the needs of the wound have been initiated to provide a microenvironment which can enhance healing including maintaining a moist environment, decreasing bioburden and insulating and protecting the wound.     Dakin's 10 minutes soaking, Bactroban, Acticoat 7 applied  Unnaboot modified Coban2 to applied to left leg and zinc Coflex 2 applied to right leg.    Vascular Status:   The patient's vascular status was assessed and it was determined that their vascular status  is adequate for healing.     Edema Management:    The patient has edema to Bilateral lower extremities.  I reviewed the leg, ankle and foot circumference measurements to assess the patient's response to compression. The edema is +3 pitting edema. Edema control is imperative for healing.  Patient instructed to elevate the lower extremities above the level of the heart when possible.  Active compression will be used to gain control of the patient's edema. Edema  managed through use of Coban 2. I described the possible complications compression wraps and told the patient to remove them if they felt too tight or became soiled, but otherwise they will leave the wraps in place until the next visit..     Nutrition:    Nutrition status is adequate.  I made suggestions regarding appropriate protein intake, vitamin supplementation, and adequate hydration. Recommendations include healthy diet    Other comorbidities that may affect wound healing: Non-healing wounds are a symptom of their significant comorbid diseases. edema and lymphedema    Coordination of Care: I recommended none needed at this time    Patient Education:  Dressing management. S/S infection and to call clinic/ED precautions if any were to occur.   Patient verbalized understanding and agreed with POC.   Home health was not indicated at this time..    The patient will continue to be seen on a regular basis.  Follow up in clinic in 1 week and PRN interval nurse visits for dressing changes.    Roosvelt Needle, DPM  05/06/2024          [1]   Social History  Socioeconomic History    Marital status: Married   Tobacco Use    Smoking status: Heavy Smoker     Current packs/day: 1.00     Types: Cigarettes    Smokeless tobacco: Current   Substance and Sexual Activity    Alcohol use: Yes     Comment: occasionally     Drug use: Yes     Comment: marijuana     Social Drivers of Health     Food Insecurity: Unknown (11/07/2022)    Hunger Vital Sign     Worried About Running Out of  Food in the Last Year: Never true    Received from [employer]    Social Network    Received from [facility]    HITS   [2] No Known Allergies  [3]   Current Outpatient Medications:     albuterol  sulfate HFA (PROVENTIL ) 108 (90 Base) MCG/ACT inhaler, Inhale 2 puffs into the lungs every 4 (four) hours as needed for Wheezing or Shortness of Breath (coughing) Dispense with spacer, Disp: 6.7 g, Rfl: 0    amlodipine-benazepril (LOTREL) 10-20 MG per capsule, Take 1 capsule by mouth daily, Disp: , Rfl:     benzonatate  (TESSALON ) 100 MG capsule, Take 1 capsule (100 mg) by mouth 3 (three) times daily as needed for Cough, Disp: 15 capsule, Rfl: 0    candesartan (ATACAND) 16 MG tablet, Take 1 tablet (16 mg) by mouth 2 (two) times daily, Disp: , Rfl:     carvedilol (COREG) 12.5 MG tablet, Take 1 tablet (12.5 mg) by mouth nightly, Disp: , Rfl:     carvedilol (COREG) 25 MG tablet, Take 1 tablet (25 mg) by mouth daily In the morning, Disp: , Rfl:     cloNIDine (CATAPRES) 0.3 MG tablet, Take 1 tablet (0.3 mg) by mouth 2 (two) times daily, Disp: , Rfl:     furosemide (LASIX) 40 MG tablet, Take 1 tablet (40 mg) by mouth 2 (two) times daily, Disp: , Rfl:     hydrALAZINE (APRESOLINE) 50 MG tablet, Take 1 tablet (50 mg) by mouth 2 (two) times daily, Disp: , Rfl:     hydrochlorothiazide (HYDRODIURIL) 25 MG tablet, Take 1 tablet (25 mg) by mouth daily, Disp: , Rfl:     naproxen  (NAPROSYN ) 500 MG tablet, Take 1 tablet (500 mg) by mouth 2 (  two) times daily as needed, Disp: , Rfl:     omeprazole (PRILOSEC) 40 MG capsule, Take 1 capsule (40 mg) by mouth daily, Disp: , Rfl:     potassium chloride (KLOR-CON M20) 20 MEQ CR tablet, Take 1 tablet (20 mEq) by mouth once daily, Disp: , Rfl:

## 2024-05-06 NOTE — Progress Notes (Signed)
 Amb[facility]  [facility]:  [address], [place], [place] [address]  T [phone]  F [phone]  Wound Follow-up - Nursing Documentation    PATIENT: [name] DOB: May 15, 1966   MR #: [id number]  AGE: 58 y.o.    REFERRING MD:  Elisabeth Lamar DASEN, MD PRIMARY MD: Elisabeth Lamar DASEN, MD      Chief Complaint   Patient presents with    Edema        Assessment & Plan   Patient arrived in the wound center on a wheel chair. RN reconciled meds and allergies. Patient missed last week appointment stated he was not feeling well.    Case Management:  Patient identification verified using two identifiers.  This patient has been determined to be at risk for a fall, extra precautionary measures have been taken to ensure their safety.    Pt reports pain from toothache, currently searching for dentist and awaiting his BP to stabilize.     Treatment:  LLE: Dakin's soak , Acticoat flex 7, unna boot, lidex + moisturizer, abd to bolster and coban 2  RLE: Unna boot, lidex, abd to bolster, coban 2  No changes until next clinic visit    RTC: 1 week    Additional Wound Procedures    Performed by: Spike Desilets IV V, RN  Authorized by: Orlan Munch, DPM  Associated wounds:   Wound 10/31/22 Lower Leg;Calf Left;Posterior  Wound 10/31/20 Edema/Edema Management Lower Leg Anterior;Left  Wound 10/31/20 Edema/Edema Management Lower Leg Anterior;Right    Body area:  Lower extremity  Lower extremity location:  R lower leg and L lower leg    Compression wrap type: multi-layer, unna boot    Wrap location: bilateral    Wrap location equal debrided location?: yes    Debrided location: left    Multi-layer: 2 layer     Procedure was performed in a clean field. The leg was washed with soap and water and then patted dry. Moisturizer was applied to the leg. A multi-layer compression bandage was applied per manufacturer's instructions. Patient tolerated procedure well and without complaints of pain or  discomfort. Patient was educated regarding signs and symptoms of overcompression, including unrelieved pain, toes turning red or purple, or numbness in foot.  Patient was instructed to manually remove outer layer (without scissors) and contact clinic immediately.     Patient Education:  -pt to bring the compression stocking next week  -keep dressings dry and intact, use cast cover if showering careful to sit while showering not stand  Patient verbalized understanding.     Derald Lorge IV LULLA Ket, RN   05/06/2024

## 2024-05-13 ENCOUNTER — Ambulatory Visit: Attending: Foot & Ankle Surgery | Admitting: Foot & Ankle Surgery

## 2024-05-13 VITALS — BP 130/91 | HR 61 | Temp 98.2°F

## 2024-05-13 DIAGNOSIS — L97222 Non-pressure chronic ulcer of left calf with fat layer exposed: Secondary | ICD-10-CM | POA: Insufficient documentation

## 2024-05-13 DIAGNOSIS — I890 Lymphedema, not elsewhere classified: Secondary | ICD-10-CM | POA: Insufficient documentation

## 2024-05-13 DIAGNOSIS — R600 Localized edema: Secondary | ICD-10-CM | POA: Insufficient documentation

## 2024-05-13 NOTE — Progress Notes (Signed)
 Ambulatory [facility]    Wound Follow-Up - Provider Documentation    PATIENT: [name] DOB: Apr 30, 1966   MR #: [id number]  AGE: 58 y.o.    REFERRING MD:  Elisabeth Lamar DASEN, MD PRIMARY MD: Elisabeth Lamar DASEN, MD        Chief Complaint   Patient presents with   . Wound Check      The patient was seen in the office today and a follow up evaluation was performed, including a history & focused exam.     [name] is a 58 year old black male who presents today with Patient reports he has had recurrent wounds and swelling of both lower legs for the past two years.  He was being treated at a wound center in [place] but moved to [place]  in July 2023.  He has been self treating with gauze/coban dressings and took a prescription for penicillin in January 2024.  PMH significant for morbid obesity, current tobacco user, hypertention, GERD and chronic back pain. Negative for diabetes.   The patient states his chief complaint is wounds of bilateral lower legs.    History of Present Illness   [name] is a 58 y.o. male who presents to [facility] wound healing center bilateral lower extremity lymphedema, edema and chronic left leg ulcers.  Patient denies fever, chills, nausea, vomiting, and leg pain.     Patient's right leg ulcer is healed however patient still have open left leg ulcer.  Patient has bilateral lower extremity lymphedema/leg edema.    Patient has been treated with Dakin's 10 minutes soaking, gentamicin, Zetuvit.  He is under multilayer compression for his leg for his leg swelling control.    Interval HPI 12/25/2023: Patient was lost in follow-up since November of last year.  Patient lost his transportation and was not able to come into wound center for follow-up until today.  Patient has been using zinc Coflex at home to control his leg swelling until he ran out of his supply.  Patient has been using Kerlix and Coban to manage his left leg ulcer and  swelling.    Interval HPI 05/08/2024: Patient was treated with Dakin's 10 minutes soaking, Bactroban, Acticoat 7.  Patient is under actual wound boot modified Coban 2 multilayer compression to left lower extremity to control his leg swelling and Coflex to right lower extremity.  Patient missed appointment last week secondary to a stomach virus.    Interval HPI 05/13/2024: Patient treated with Dakin's 10-minute soaking, Bactroban, Acticoat 7.  Patient is under Radio broadcast assistant modified Coban 2 multilayer compression on his left leg and Coban 2 on right leg.    Past Medical History     Past Medical History:   Diagnosis Date   . GERD (gastroesophageal reflux disease)    . Hypertension    . Lower back pain        Past Surgical History   No past surgical history on file.    Family History   No family history on file.    Social History   Social History[1]    Allergies   Allergies[2]    Medications   Current Medications[3]    Physical Exam     Vitals:    05/13/24 1054   BP: (!) 130/91   Pulse: 61   Temp: 98.2 F (36.8 C)     There is no height or weight on file to calculate BMI.    General:  Patient appears their stated age, well-nourished.  Alert and in no apparent distress.  Lungs: Respiratory effort unlabored, no signs of distress  Vascular: normal capillary refill  Skin: see focused wound care exam  Neuro: AAOx3 normal speech and appropriate affect, see wound care exam for sensory exam    Focused Wound Care Exam   Wound Assessment:   Wound 10/31/22 Lower Leg;Calf Left;Posterior (Active)   Date First Assessed: 10/31/22   Location: Lower Leg;Calf  Wound Location Orientation: Left;Posterior  Wound Description: lateral/posterior  Present on Original Admission: Yes      Assessments 05/13/2024 11:00 AM   Wound Image     Wound Length (cm) 3.4 cm   Wound Width (cm) 10.3 cm   Wound Depth (cm) 0.1 cm   Wound Surface Area (cm^2) 27.5 cm^2   Wound Volume (cm^3) 1.834 cm^3       Wound 10/31/20 Edema/Edema Management Lower Leg Anterior;Left  (Active)   Date First Assessed/Time First Assessed: 10/31/20 0800   Primary Wound Type: Edema/Edema Management  Edema extremity: LLE  Edema Swelling: Pitting  Location: Lower Leg  Wound Location Orientation: Anterior;Left      Assessments 05/13/2024 11:18 AM   Wound Image         Wound 10/31/20 Edema/Edema Management Lower Leg Anterior;Right (Active)   Date First Assessed/Time First Assessed: 10/31/20 0800   Primary Wound Type: Edema/Edema Management  Edema extremity: RLE  Edema Swelling: Pitting  Location: Lower Leg  Wound Location Orientation: Anterior;Right      Assessments 05/13/2024 11:18 AM   Wound Image       Edema  RLE Edema: Pitting  R Foot Measurements(cm): 27 cm  R Ankle Measurements(cm): 26.5 cm  R Calf Measurements(cm): 47.5 cm  RLE Treatment: RLE Multilayer  RLE Multilayer Tx: Coban 2  LLE Edema: Pitting  L Foot Measurements(cm): 26 cm  L Ankle Measurements(cm): 26.5 cm  L Calf Measurements(cm): 48 cm  LLE Treatment: LLE Multilayer  LLE Multilayer Tx: Coban 2      Procedure Note   No debridement needed    Procedures     Assessment & Plan   I examined the patient for an wound(s) that has been resistant to healing despite numerous interventions. The plan is to now examine any underlying characteristics that may impede healing. I decided that the wounds are not self limited and significantly impact the patient's daily living.    Wound Assessment and Plan:  The wounds are assessed as followed: Improving.    No sign of clinical infection.    After evaluation of the patient and ulcer/wound characteristics, I decided that debridement was indicated today.    Wound Care Plan:  Advanced Wound dressings appropriate to the needs of the wound have been initiated to provide a microenvironment which can enhance healing including maintaining a moist environment, decreasing bioburden and insulating and protecting the wound.     Dakin's 10 minutes soaking, Bactroban, Acticoat 7 applied  Unnaboot modified Coban2 to applied  to left leg and zinc Coflex 2 applied to right leg.    Vascular Status:   The patient's vascular status was assessed and it was determined that their vascular status is adequate for healing.     Edema Management:    The patient has edema to Bilateral lower extremities.  I reviewed the leg, ankle and foot circumference measurements to assess the patient's response to compression. The edema is +3 pitting edema. Edema control is imperative for healing.  Patient instructed to elevate the  lower extremities above the level of the heart when possible.  Active compression will be used to gain control of the patient's edema. Edema managed through use of Coban 2. I described the possible complications compression wraps and told the patient to remove them if they felt too tight or became soiled, but otherwise they will leave the wraps in place until the next visit..     Nutrition:    Nutrition status is adequate.  I made suggestions regarding appropriate protein intake, vitamin supplementation, and adequate hydration. Recommendations include healthy diet    Other comorbidities that may affect wound healing: Non-healing wounds are a symptom of their significant comorbid diseases. edema and lymphedema    Coordination of Care: I recommended none needed at this time    Patient Education:  Dressing management. S/S infection and to call clinic/ED precautions if any were to occur.   Patient verbalized understanding and agreed with POC.   Home health was not indicated at this time..    The patient will continue to be seen on a regular basis.  Follow up in clinic in 1 week and PRN interval nurse visits for dressing changes.    Roosvelt Needle, DPM  05/13/2024              [1]  Social History  Socioeconomic History   . Marital status: Married   Tobacco Use   . Smoking status: Heavy Smoker     Current packs/day: 1.00     Types: Cigarettes   . Smokeless tobacco: Current   Substance and Sexual Activity   . Alcohol use: Yes     Comment:  occasionally    . Drug use: Yes     Comment: marijuana     Social Drivers of Health     Food Insecurity: Unknown (11/07/2022)    Hunger Vital Sign    . Worried About Programme researcher, broadcasting/film/video in the Last Year: Never true    Received from [employer]    Received from [employer]    HITS   [2]  No Known Allergies  [3]    Current Outpatient Medications:   .  albuterol  sulfate HFA (PROVENTIL ) 108 (90 Base) MCG/ACT inhaler, Inhale 2 puffs into the lungs every 4 (four) hours as needed for Wheezing or Shortness of Breath (coughing) Dispense with spacer, Disp: 6.7 g, Rfl: 0  .  amlodipine-benazepril (LOTREL) 10-20 MG per capsule, Take 1 capsule by mouth daily, Disp: , Rfl:   .  benzonatate  (TESSALON ) 100 MG capsule, Take 1 capsule (100 mg) by mouth 3 (three) times daily as needed for Cough, Disp: 15 capsule, Rfl: 0  .  candesartan (ATACAND) 16 MG tablet, Take 1 tablet (16 mg) by mouth 2 (two) times daily, Disp: , Rfl:   .  carvedilol (COREG) 12.5 MG tablet, Take 1 tablet (12.5 mg) by mouth nightly, Disp: , Rfl:   .  carvedilol (COREG) 25 MG tablet, Take 1 tablet (25 mg) by mouth daily In the morning, Disp: , Rfl:   .  cloNIDine (CATAPRES) 0.3 MG tablet, Take 1 tablet (0.3 mg) by mouth 2 (two) times daily, Disp: , Rfl:   .  furosemide (LASIX) 40 MG tablet, Take 1 tablet (40 mg) by mouth 2 (two) times daily, Disp: , Rfl:   .  hydrALAZINE (APRESOLINE) 50 MG tablet, Take 1 tablet (50 mg) by mouth 2 (two) times daily, Disp: , Rfl:   .  hydrochlorothiazide (HYDRODIURIL) 25 MG  tablet, Take 1 tablet (25 mg) by mouth daily, Disp: , Rfl:   .  naproxen  (NAPROSYN ) 500 MG tablet, Take 1 tablet (500 mg) by mouth 2 (two) times daily as needed, Disp: , Rfl:   .  omeprazole (PRILOSEC) 40 MG capsule, Take 1 capsule (40 mg) by mouth daily, Disp: , Rfl:   .  potassium chloride (KLOR-CON M20) 20 MEQ CR tablet, Take 1 tablet (20 mEq) by mouth once daily, Disp: , Rfl:

## 2024-05-13 NOTE — Progress Notes (Signed)
 Ambulatory [facility]  [facility]:  [address] [place], [place] [address]  T [phone]  F [phone]  Wound Follow-up - Nursing Documentation    PATIENT: [name] DOB: May 21, 1966   MR #: [id number]  AGE: 58 y.o.    REFERRING MD:  Elisabeth Lamar DASEN, MD PRIMARY MD: Elisabeth Lamar DASEN, MD      Chief Complaint   Patient presents with    Wound Check        Assessment & Plan     Case Management:  Patient identification verified using two identifiers.  This patient has been determined to be at risk for a fall, extra precautionary measures have been taken to ensure their safety.          Treatment:    POC: LLE: Dakin's Soak 10min, Acticoat Flex 7, unna boot, lidex + vaseline to peeling areas, ABDs to bolster where needed, and coban 2  RLE: unna boot, lidex, abd to bolster, coban 2     RTC: 1 week      Procedures:  Additional Wound Procedures    Performed by: Babara Almarie PARAS, RN  Authorized by: Orlan Munch, DPM  Associated wounds:   Wound 10/31/20 Edema/Edema Management Lower Leg Anterior;Right  Wound 10/31/20 Edema/Edema Management Lower Leg Anterior;Left    Body area:  Lower extremity  Lower extremity location:  R lower leg and L lower leg    Compression wrap type: multi-layer    Wrap location: bilateral    Wrap location equal debrided location?: yes    Debrided location: bilateral    Multi-layer: coban 2     Procedure was performed in a clean field. The leg was washed with soap and water and then patted dry. Moisturizer was applied to the leg. A multi-layer compression bandage was applied per manufacturer's instructions. Patient tolerated procedure well and without complaints of pain or discomfort. Patient was educated regarding signs and symptoms of overcompression, including unrelieved pain, toes turning red or purple, or numbness in foot.  Patient was instructed to manually remove outer layer (without scissors) and contact clinic immediately.         Patient Education:  If  wraps get wet/damaged, or if they're too tight, or if s/he has any questions: please call the [facility]. We are open M-F, 8:30-4:30, closed on weekends. If patient needs care during our off hours, please don't hesitate and go to Urgent Care or Emergency Room. Pt never to use scissors to remove wraps if too tight. Patient verbalized understanding.      Almarie PARAS Babara, RN   05/13/2024

## 2024-05-20 ENCOUNTER — Ambulatory Visit: Attending: Foot & Ankle Surgery | Admitting: Foot & Ankle Surgery

## 2024-05-20 VITALS — BP 100/65 | HR 67 | Temp 97.4°F

## 2024-05-20 DIAGNOSIS — L97222 Non-pressure chronic ulcer of left calf with fat layer exposed: Secondary | ICD-10-CM | POA: Insufficient documentation

## 2024-05-20 DIAGNOSIS — R600 Localized edema: Secondary | ICD-10-CM | POA: Insufficient documentation

## 2024-05-20 DIAGNOSIS — I890 Lymphedema, not elsewhere classified: Secondary | ICD-10-CM | POA: Insufficient documentation

## 2024-05-20 DIAGNOSIS — L03116 Cellulitis of left lower limb: Secondary | ICD-10-CM | POA: Insufficient documentation

## 2024-05-20 MED ORDER — AMOXICILLIN-POT CLAVULANATE 875-125 MG PO TABS
1.0000 | ORAL_TABLET | Freq: Two times a day (BID) | ORAL | 0 refills | Status: AC
Start: 2024-05-20 — End: 2024-05-27

## 2024-05-20 NOTE — Progress Notes (Signed)
 Ambulatory [facility]  [facility]:  [address], [place], [place] [address]  T [phone]  F [phone]  Wound Follow-up - Nursing Documentation    PATIENT: [name] DOB: 1966-05-23   MR #: [id number]  AGE: 58 y.o.    REFERRING MD:  Elisabeth Lamar DASEN, MD PRIMARY MD: Elisabeth Lamar DASEN, MD      Chief Complaint   Patient presents with    Edema    Wound Check        Assessment & Plan   Patient arrived in the wound center on a wheel chair. RN reconciled meds and allergies. Culture done on LLE wound and sent to lab    Case Management:  Patient identification verified using two identifiers.  This patient has been determined to be at risk for a fall, extra precautionary measures have been taken to ensure their safety.      Treatment:    POC: LLE: Dakin's Soak , bactroban Acticoat Flex 7, unna boot, lidex + vaseline to peeling areas, ABDs to bolster where needed, and coban 2  RLE: unna boot, lidex, abd to bolster, coban 2     RTC: 1 week    Additional Wound Procedures    Performed by: Mikita Lesmeister IV V, RN  Authorized by: Orlan Munch, DPM  Associated wounds:   Wound 10/31/22 Lower Leg;Calf Left;Posterior  Wound 10/31/20 Edema/Edema Management Lower Leg Anterior;Left  Wound 10/31/20 Edema/Edema Management Lower Leg Anterior;Right    Body area:  Lower extremity  Lower extremity location:  R lower leg and L lower leg    Compression wrap type: multi-layer    Wrap location: bilateral    Wrap location equal debrided location?: yes    Debrided location: bilateral    Multi-layer: 2 layer     Procedure was performed in a clean field. The leg was washed with soap and water and then patted dry. Moisturizer was applied to the leg. A multi-layer compression bandage was applied per manufacturer's instructions. Patient tolerated procedure well and without complaints of pain or discomfort. Patient was educated regarding signs and symptoms of overcompression, including unrelieved pain,  toes turning red or purple, or numbness in foot.  Patient was instructed to manually remove outer layer (without scissors) and contact clinic immediately.       Patient Education:  If wraps get wet/damaged, or if they're too tight, or if s/he has any questions: please call the [facility]. We are open M-F, 8:30-4:30, closed on weekends. If patient needs care during our off hours, please don't hesitate and go to Urgent Care or Emergency Room. Pt never to use scissors to remove wraps if too tight. Patient verbalized understanding.      Ida Milbrath IV LULLA Ket, RN   05/20/2024

## 2024-05-20 NOTE — Progress Notes (Signed)
 Ambulatory  [facility]    Wound Follow-Up - Provider Documentation    PATIENT: [name] DOB: 1966-02-11   MR #: [id number]  AGE: 58 y.o.    REFERRING MD:  Elisabeth Lamar DASEN, MD PRIMARY MD: Elisabeth Lamar DASEN, MD        Chief Complaint   Patient presents with    Edema    Wound Check      The patient was seen in the office today and a follow up evaluation was performed, including a history & focused exam.     [name] is a 58 year old black male who presents today with Patient reports he has had recurrent wounds and swelling of both lower legs for the past two years.  He was being treated at a wound center in [place] but moved to [place]  in July 2023.  He has been self treating with gauze/coban dressings and took a prescription for penicillin in January 2024.  PMH significant for morbid obesity, current tobacco user, hypertention, GERD and chronic back pain. Negative for diabetes.   The patient states his chief complaint is wounds of bilateral lower legs.    History of Present Illness   [name] is a 58 y.o. male who presents to [facility] wound healing center bilateral lower extremity lymphedema, edema and chronic left leg ulcers.  Patient denies fever, chills, nausea, vomiting, and leg pain.     Patient's right leg ulcer is healed however patient still have open left leg ulcer.  Patient has bilateral lower extremity lymphedema/leg edema.    Patient has been treated with Dakin's 10 minutes soaking, gentamicin, Zetuvit.  He is under multilayer compression for his leg for his leg swelling control.    Interval HPI 12/25/2023: Patient was lost in follow-up since November of last year.  Patient lost his transportation and was not able to come into wound center for follow-up until today.  Patient has been using zinc Coflex at home to control his leg swelling until he ran out of his supply.  Patient has been using Kerlix and Coban to manage his left leg  ulcer and swelling.    Interval HPI 05/08/2024: Patient was treated with Dakin's 10 minutes soaking, Bactroban, Acticoat 7.  Patient is under actual wound boot modified Coban 2 multilayer compression to left lower extremity to control his leg swelling and Coflex to right lower extremity.  Patient missed appointment last week secondary to a stomach virus.    Interval HPI 05/20/2024: Patient treated with Dakin's 10-minute soaking, Bactroban, Acticoat 7.  Patient is under Radio broadcast assistant modified Coban 2 multilayer compression on his left leg and Coban 2 on right leg.    Past Medical History     Past Medical History:   Diagnosis Date    GERD (gastroesophageal reflux disease)     Hypertension     Lower back pain        Past Surgical History   No past surgical history on file.    Family History   No family history on file.    Social History   Social History[1]    Allergies   Allergies[2]    Medications   Current Medications[3]    Physical Exam     There were no vitals filed for this visit.    There is no height or weight on file to calculate BMI.    General:  Patient appears their stated age, well-nourished.  Alert and in  no apparent distress.  Lungs: Respiratory effort unlabored, no signs of distress  Vascular: normal capillary refill  Skin: see focused wound care exam  Neuro: AAOx3 normal speech and appropriate affect, see wound care exam for sensory exam    Focused Wound Care Exam   Wound Assessment:   Wound 10/31/22 Lower Leg;Calf Left;Posterior (Active)   Date First Assessed: 10/31/22   Location: Lower Leg;Calf  Wound Location Orientation: Left;Posterior  Wound Description: lateral/posterior  Present on Original Admission: Yes      Assessments 05/20/2024 11:01 AM   Wound Image     Wound Base Description Full Thickness;Red   Peri-wound Description Hyperpigmented;Peeling/Flaking   Wound Length (cm) 6 cm   Wound Width (cm) 6.9 cm   Wound Depth (cm) 0.1 cm   Wound Surface Area (cm^2) 32.52 cm^2   Wound Volume (cm^3) 2.168 cm^3    Closure Open   Drainage Amount Small   Drainage Description Serosanguinous   Margins Open;Undefined edges   Treatments Cleansed;Compression Wrap1   Compression Wrap 1 Coban 2 or equivalent   Topical Dakins Solution       Wound 10/31/20 Edema/Edema Management Lower Leg Anterior;Left (Active)   Date First Assessed/Time First Assessed: 10/31/20 0800   Primary Wound Type: Edema/Edema Management  Edema extremity: LLE  Edema Swelling: Pitting  Location: Lower Leg  Wound Location Orientation: Anterior;Left      Assessments 05/20/2024 11:01 AM   Wound Image         Wound 10/31/20 Edema/Edema Management Lower Leg Anterior;Right (Active)   Date First Assessed/Time First Assessed: 10/31/20 0800   Primary Wound Type: Edema/Edema Management  Edema extremity: RLE  Edema Swelling: Pitting  Location: Lower Leg  Wound Location Orientation: Anterior;Right      Assessments 05/20/2024 11:01 AM   Wound Image       Edema  RLE Edema: Pitting; 2+ indentation < 5mm  R Foot Measurements(cm): 27 cm  R Ankle Measurements(cm): 26 cm  R Calf Measurements(cm): 49 cm  RLE Treatment: RLE Multilayer  RLE Multilayer Tx: Coban 2  LLE Edema: Pitting; 2+ indentation < 5mm  L Foot Measurements(cm): 26 cm  L Ankle Measurements(cm): 26.5 cm  L Calf Measurements(cm): 49.5 cm  LLE Treatment: LLE Multilayer  LLE Multilayer Tx: Coban 2      Procedure Note   No debridement needed    Procedures     Assessment & Plan   I examined the patient for an wound(s) that has been resistant to healing despite numerous interventions. The plan is to now examine any underlying characteristics that may impede healing. I decided that the wounds are not self limited and significantly impact the patient's daily living.    Wound Assessment and Plan:  The wounds are assessed as followed: Leg ulcers stable but more maceration noted today.    No sign of clinical infection.  Due to increased maceration wound culture was performed and sent to the lab.  Rx Augmentin  875 mg twice daily for  7 days for empiric therapy.    After evaluation of the patient and ulcer/wound characteristics, I decided that debridement was indicated today.    Wound Care Plan:  Advanced Wound dressings appropriate to the needs of the wound have been initiated to provide a microenvironment which can enhance healing including maintaining a moist environment, decreasing bioburden and insulating and protecting the wound.     Dakin's 10 minutes soaking, Bactroban, Acticoat 7 applied  Unnaboot modified Coban2 to applied to left leg and zinc Coflex  2 applied to right leg.    Vascular Status:   The patient's vascular status was assessed and it was determined that their vascular status is adequate for healing.     Edema Management:    The patient has edema to Bilateral lower extremities.  I reviewed the leg, ankle and foot circumference measurements to assess the patient's response to compression. The edema is +3 pitting edema. Edema control is imperative for healing.  Patient instructed to elevate the lower extremities above the level of the heart when possible.  Active compression will be used to gain control of the patient's edema. Edema managed through use of Coban 2. I described the possible complications compression wraps and told the patient to remove them if they felt too tight or became soiled, but otherwise they will leave the wraps in place until the next visit..     Nutrition:    Nutrition status is adequate.  I made suggestions regarding appropriate protein intake, vitamin supplementation, and adequate hydration. Recommendations include healthy diet    Other comorbidities that may affect wound healing: Non-healing wounds are a symptom of their significant comorbid diseases. edema and lymphedema    Coordination of Care: I recommended none needed at this time    Patient Education:  Dressing management. S/S infection and to call clinic/ED precautions if any were to occur.   Patient verbalized understanding and agreed with POC.    Home health was not indicated at this time..    The patient will continue to be seen on a regular basis.  Follow up in clinic in 1 week and PRN interval nurse visits for dressing changes.    Roosvelt Needle, DPM  05/20/2024                [1]   Social History  Socioeconomic History    Marital status: Married   Tobacco Use    Smoking status: Heavy Smoker     Current packs/day: 1.00     Types: Cigarettes    Smokeless tobacco: Current   Substance and Sexual Activity    Alcohol use: Yes     Comment: occasionally     Drug use: Yes     Comment: marijuana     Social Drivers of Health     Food Insecurity: Unknown (11/07/2022)    Hunger Vital Sign     Worried About Running Out of Food in the Last Year: Never true    Received from [employer]    Social Network    Received from [facility]    HITS   [2] No Known Allergies  [3]   Current Outpatient Medications:     albuterol  sulfate HFA (PROVENTIL ) 108 (90 Base) MCG/ACT inhaler, Inhale 2 puffs into the lungs every 4 (four) hours as needed for Wheezing or Shortness of Breath (coughing) Dispense with spacer, Disp: 6.7 g, Rfl: 0    amlodipine-benazepril (LOTREL) 10-20 MG per capsule, Take 1 capsule by mouth daily, Disp: , Rfl:     benzonatate  (TESSALON ) 100 MG capsule, Take 1 capsule (100 mg) by mouth 3 (three) times daily as needed for Cough, Disp: 15 capsule, Rfl: 0    candesartan (ATACAND) 16 MG tablet, Take 1 tablet (16 mg) by mouth 2 (two) times daily, Disp: , Rfl:     carvedilol (COREG) 12.5 MG tablet, Take 1 tablet (12.5 mg) by mouth nightly, Disp: , Rfl:     carvedilol (COREG) 25 MG tablet, Take 1 tablet (25 mg) by mouth daily  In the morning, Disp: , Rfl:     cloNIDine (CATAPRES) 0.3 MG tablet, Take 1 tablet (0.3 mg) by mouth 2 (two) times daily, Disp: , Rfl:     furosemide (LASIX) 40 MG tablet, Take 1 tablet (40 mg) by mouth 2 (two) times daily, Disp: , Rfl:     hydrALAZINE (APRESOLINE) 50 MG tablet, Take 1 tablet (50 mg) by mouth 2 (two) times daily, Disp: , Rfl:      hydrochlorothiazide (HYDRODIURIL) 25 MG tablet, Take 1 tablet (25 mg) by mouth daily, Disp: , Rfl:     naproxen  (NAPROSYN ) 500 MG tablet, Take 1 tablet (500 mg) by mouth 2 (two) times daily as needed, Disp: , Rfl:     omeprazole (PRILOSEC) 40 MG capsule, Take 1 capsule (40 mg) by mouth daily, Disp: , Rfl:     potassium chloride (KLOR-CON M20) 20 MEQ CR tablet, Take 1 tablet (20 mEq) by mouth once daily, Disp: , Rfl:

## 2024-05-23 LAB — CULTURE AND GRAM STAIN, AEROBIC BACTERIA, WOUND/TISSUE/FLUID: Gram Stain: NONE SEEN

## 2024-05-25 ENCOUNTER — Ambulatory Visit: Payer: Self-pay | Admitting: Physician Assistant

## 2024-05-27 ENCOUNTER — Ambulatory Visit

## 2024-06-03 ENCOUNTER — Ambulatory Visit: Attending: Foot & Ankle Surgery | Admitting: Foot & Ankle Surgery

## 2024-06-03 VITALS — BP 156/97 | HR 90 | Temp 98.4°F

## 2024-06-03 DIAGNOSIS — L97222 Non-pressure chronic ulcer of left calf with fat layer exposed: Secondary | ICD-10-CM | POA: Insufficient documentation

## 2024-06-03 DIAGNOSIS — I890 Lymphedema, not elsewhere classified: Secondary | ICD-10-CM | POA: Insufficient documentation

## 2024-06-03 DIAGNOSIS — R600 Localized edema: Secondary | ICD-10-CM | POA: Insufficient documentation

## 2024-06-03 DIAGNOSIS — L97219 Non-pressure chronic ulcer of right calf with unspecified severity: Secondary | ICD-10-CM | POA: Insufficient documentation

## 2024-06-03 MED ORDER — AMOXICILLIN-POT CLAVULANATE 875-125 MG PO TABS
1.0000 | ORAL_TABLET | Freq: Two times a day (BID) | ORAL | 0 refills | Status: AC
Start: 2024-06-03 — End: 2024-06-13

## 2024-06-03 NOTE — Progress Notes (Signed)
 [facility]    Wound Follow-Up - Provider Documentation    PATIENT: [name] DOB: Oct 19, 1965   MR #: [id number]  AGE: 58 y.o.    REFERRING MD:  Elisabeth Lamar DASEN, MD PRIMARY MD: Elisabeth Lamar DASEN, MD        Chief Complaint   Patient presents with    Wound Check      The patient was seen in the office today and a follow up evaluation was performed, including a history & focused exam.     [name] is a 58 year old black male who presents today with Patient reports he has had recurrent wounds and swelling of both lower legs for the past two years.  He was being treated at a wound center in [place] but moved to [place]  in July 2023.  He has been self treating with gauze/coban dressings and took a prescription for penicillin in January 2024.  PMH significant for morbid obesity, current tobacco user, hypertention, GERD and chronic back pain. Negative for diabetes.   The patient states his chief complaint is wounds of bilateral lower legs.    History of Present Illness   [name] is a 58 y.o. male who presents to [facility] wound healing center bilateral lower extremity lymphedema, edema and chronic left leg ulcers.  Patient denies fever, chills, nausea, vomiting, and leg pain.     Patient's right leg ulcer is healed however patient still have open left leg ulcer.  Patient has bilateral lower extremity lymphedema/leg edema.    Patient has been treated with Dakin's 10 minutes soaking, gentamicin, Zetuvit.  He is under multilayer compression for his leg for his leg swelling control.    Interval HPI 05/20/2024: Patient treated with Dakin's 10-minute soaking, Bactroban, Acticoat 7.  Patient is under Radio broadcast assistant modified Coban 2 multilayer compression on his left leg and Coban 2 on right leg.    Interval HPI 06/03/2024: Patient missed appointment secondary to parking issue last week.  Patient was treated with Dakin's 10 minutes soaking, Bactroban,  Acticoat 7.  Patient is under Unna boot modified Coban 2 multilayer compression to control his leg swelling.    Past Medical History     Past Medical History:   Diagnosis Date    GERD (gastroesophageal reflux disease)     Hypertension     Lower back pain        Past Surgical History   History reviewed. No pertinent surgical history.    Family History   No family history on file.    Social History   Social History[1]    Allergies   Allergies[2]    Medications   Current Medications[3]    Physical Exam     Vitals:    06/03/24 1055   BP: (!) 156/97   Pulse: 90   Temp: 98.4 F (36.9 C)       There is no height or weight on file to calculate BMI.    General:  Patient appears their stated age, well-nourished.  Alert and in no apparent distress.  Lungs: Respiratory effort unlabored, no signs of distress  Vascular: normal capillary refill  Skin: see focused wound care exam  Neuro: AAOx3 normal speech and appropriate affect, see wound care exam for sensory exam    Focused Wound Care Exam   Wound Assessment:   Wound 10/31/22 Lower Leg;Calf Left;Posterior (Active)   Date First Assessed: 10/31/22   Location: Lower Leg;Calf  Wound Location Orientation: Left;Posterior  Wound Description: lateral/posterior  Present on Original Admission: Yes      Assessments 06/03/2024 11:07 AM   Wound Image     Wound Base Description Moist;Partial Thickness;Red;Fibrin/slough;White   Peri-wound Description Dark edges;Red   Wound Length (cm) 4 cm   Wound Width (cm) 3.8 cm   Wound Depth (cm) 0.1 cm   Wound Surface Area (cm^2) 11.94 cm^2   Wound Volume (cm^3) 0.796 cm^3   Closure Open   Drainage Amount Small   Drainage Description Serosanguinous   Tunneling No   Undermining  No   Margins Defined edges   Treatments Cleansed   Topical Pharmaceutical agent   Dressing Silver (Ag) Dressing;Abdominal Dressing       Wound 10/31/20 Edema/Edema Management Lower Leg Anterior;Left (Active)   Date First Assessed/Time First Assessed: 10/31/20 0800   Primary Wound  Type: Edema/Edema Management  Edema extremity: LLE  Edema Swelling: Pitting  Location: Lower Leg  Wound Location Orientation: Anterior;Left      Assessments 06/03/2024 11:07 AM   Wound Image     Topical Pharmaceutical agent       Wound 10/31/20 Edema/Edema Management Lower Leg Anterior;Right (Active)   Date First Assessed/Time First Assessed: 10/31/20 0800   Primary Wound Type: Edema/Edema Management  Edema extremity: RLE  Edema Swelling: Pitting  Location: Lower Leg  Wound Location Orientation: Anterior;Right      Assessments 06/03/2024 11:07 AM   Wound Image     Topical Pharmaceutical agent       Edema  RLE Edema: Pitting; 2+ indentation < 5mm  R Foot Measurements(cm): 27 cm  R Ankle Measurements(cm): 26 cm  R Calf Measurements(cm): 48 cm  RLE Treatment: RLE Multilayer  RLE Multilayer Tx: Coban 2  LLE Edema: Pitting; 2+ indentation < 5mm  L Foot Measurements(cm): 26 cm  L Ankle Measurements(cm): 26 cm  L Calf Measurements(cm): 49 cm  LLE Treatment: LLE Multilayer  LLE Multilayer Tx: Coban 2        Procedure Note   No debridement needed    Procedures     Assessment & Plan   I examined the patient for an wound(s) that has been resistant to healing despite numerous interventions. The plan is to now examine any underlying characteristics that may impede healing. I decided that the wounds are not self limited and significantly impact the patient's daily living.    Wound Assessment and Plan:  The wounds are assessed as followed: Leg ulcers stable but more maceration noted today.    No sign of clinical infection.  Wound culture result reviewed patient.  Moderate growth of Staphylococcus aureus sensitive to oxacillin.  Continue with Augmentin  875 mg twice daily.    After evaluation of the patient and ulcer/wound characteristics, I decided that debridement was indicated today.    Wound Care Plan:  Advanced Wound dressings appropriate to the needs of the wound have been initiated to provide a microenvironment which can  enhance healing including maintaining a moist environment, decreasing bioburden and insulating and protecting the wound.     Dakin's 10 minutes soaking, Bactroban, Acticoat 7 applied  Unnaboot modified Coban2 to applied to left leg and zinc Coflex 2 applied to right leg.    Vascular Status:   The patient's vascular status was assessed and it was determined that their vascular status is adequate for healing.     Edema Management:    The patient has edema to Bilateral lower extremities.  I reviewed the leg, ankle  and foot circumference measurements to assess the patient's response to compression. The edema is +3 pitting edema. Edema control is imperative for healing.  Patient instructed to elevate the lower extremities above the level of the heart when possible.  Active compression will be used to gain control of the patient's edema. Edema managed through use of Coban 2. I described the possible complications compression wraps and told the patient to remove them if they felt too tight or became soiled, but otherwise they will leave the wraps in place until the next visit..     Nutrition:    Nutrition status is adequate.  I made suggestions regarding appropriate protein intake, vitamin supplementation, and adequate hydration. Recommendations include healthy diet    Other comorbidities that may affect wound healing: Non-healing wounds are a symptom of their significant comorbid diseases. edema and lymphedema    Coordination of Care: I recommended none needed at this time    Patient Education:  Dressing management. S/S infection and to call clinic/ED precautions if any were to occur.   Patient verbalized understanding and agreed with POC.   Home health was not indicated at this time..    The patient will continue to be seen on a regular basis.  Follow up in clinic in 1 week and PRN interval nurse visits for dressing changes.    Roosvelt Needle, DPM  06/03/2024            [1]   Social History  Socioeconomic History     Marital status: Married   Tobacco Use    Smoking status: Heavy Smoker     Current packs/day: 1.00     Types: Cigarettes    Smokeless tobacco: Current   Substance and Sexual Activity    Alcohol use: Yes     Comment: occasionally     Drug use: Yes     Comment: marijuana     Social Drivers of Health     Food Insecurity: Unknown (11/07/2022)    Hunger Vital Sign     Worried About Running Out of Food in the Last Year: Never true    Received from [employer]    Social Network    Received from [employer]    HITS   [2] No Known Allergies  [3]   Current Outpatient Medications:     albuterol  sulfate HFA (PROVENTIL ) 108 (90 Base) MCG/ACT inhaler, Inhale 2 puffs into the lungs every 4 (four) hours as needed for Wheezing or Shortness of Breath (coughing) Dispense with spacer, Disp: 6.7 g, Rfl: 0    amlodipine-benazepril (LOTREL) 10-20 MG per capsule, Take 1 capsule by mouth daily, Disp: , Rfl:     amoxicillin -clavulanate (AUGMENTIN ) 875-125 MG per tablet, Take 1 tablet by mouth 2 (two) times daily for 10 days, Disp: 20 tablet, Rfl: 0    benzonatate  (TESSALON ) 100 MG capsule, Take 1 capsule (100 mg) by mouth 3 (three) times daily as needed for Cough, Disp: 15 capsule, Rfl: 0    candesartan (ATACAND) 16 MG tablet, Take 1 tablet (16 mg) by mouth 2 (two) times daily, Disp: , Rfl:     carvedilol (COREG) 12.5 MG tablet, Take 1 tablet (12.5 mg) by mouth nightly, Disp: , Rfl:     carvedilol (COREG) 25 MG tablet, Take 1 tablet (25 mg) by mouth daily In the morning, Disp: , Rfl:     cloNIDine (CATAPRES) 0.3 MG tablet, Take 1 tablet (0.3 mg) by mouth 2 (two) times daily, Disp: , Rfl:  furosemide (LASIX) 40 MG tablet, Take 1 tablet (40 mg) by mouth 2 (two) times daily, Disp: , Rfl:     hydrALAZINE (APRESOLINE) 50 MG tablet, Take 1 tablet (50 mg) by mouth 2 (two) times daily, Disp: , Rfl:     hydrochlorothiazide (HYDRODIURIL) 25 MG tablet, Take 1 tablet (25 mg) by mouth daily, Disp: , Rfl:     naproxen  (NAPROSYN ) 500 MG tablet, Take 1  tablet (500 mg) by mouth 2 (two) times daily as needed, Disp: , Rfl:     omeprazole (PRILOSEC) 40 MG capsule, Take 1 capsule (40 mg) by mouth daily, Disp: , Rfl:     potassium chloride (KLOR-CON M20) 20 MEQ CR tablet, Take 1 tablet (20 mEq) by mouth once daily, Disp: , Rfl:

## 2024-06-03 NOTE — Progress Notes (Signed)
 [facility]  [facility]:  [address] [place], [place] [address]  T [phone]  F [phone]  Wound Follow-up - Nursing Documentation    PATIENT: Gregory MCGUIRT DOB: November 03, 1965   MR #: [id number]  AGE: 58 y.o.    REFERRING MD:  Elisabeth Lamar DASEN, MD PRIMARY MD: Elisabeth Lamar DASEN, MD      Chief Complaint   Patient presents with    Wound Check        Assessment & Plan   Patient arrived in the wound center on a wheel chair. RN reconciled meds and allergies. Culture done on LLE wound and sent to lab    Case Management:  Patient identification verified using two identifiers.  This patient has been determined to be at risk for a fall, extra precautionary measures have been taken to ensure their safety.   RN reconciled meds and allergies.     Treatment:    POC: LLE: Dakin's Soak , bactroban Acticoat Flex 7, unna boot, lidex + vaseline to peeling areas, ABDs to bolster where needed, and coban 2  RLE: unna boot, lidex, abd to bolster, coban 2     RTC: 1 week    Additional Wound Procedures    Performed by: Celinda Arabia, RN  Authorized by: Orlan Munch, DPM  Associated wounds:   Wound 10/31/20 Edema/Edema Management Lower Leg Anterior;Left  Wound 10/31/20 Edema/Edema Management Lower Leg Anterior;Right    Body area:  Lower extremity  Lower extremity location:  R lower leg and L lower leg    Compression wrap type: multi-layer    Wrap location: bilateral    Wrap location equal debrided location?: yes    Debrided location: bilateral    Multi-layer: 2 layer     Procedure was performed in a clean field. The leg was washed with soap and water and then patted dry. Moisturizer was applied to the leg. A multi-layer compression bandage was applied per manufacturer's instructions. Patient tolerated procedure well and without complaints of pain or discomfort. Patient was educated regarding signs and symptoms of overcompression, including unrelieved pain, toes turning red or purple, or  numbness in foot.  Patient was instructed to manually remove outer layer (without scissors) and contact clinic immediately.       Patient Education:  - Plan of care and wound dressings.  - reviewed: Elevate legs for 30 mins, 3x/day, above the level of the heart. Above the level of the heart, is key here. The easiest way to achieve this is to lay down flat and prop your legs up with at least 2 pillows.  - If wraps get wet/damaged, or if they're too tight, or if s/he has any questions: please call the [facility]. We are open M-F, 8:30-4:30, closed on weekends. If patient needs care during our off hours, please don't hesitate and go to Urgent Care or Emergency Room. Pt never to use scissors to remove wraps if too tight.   - Patient verbalized understanding.      Arabia Celinda, RN   06/03/2024

## 2024-06-10 ENCOUNTER — Ambulatory Visit: Admitting: Foot & Ankle Surgery

## 2024-06-10 VITALS — BP 143/77 | HR 71 | Wt >= 6400 oz

## 2024-06-10 DIAGNOSIS — L97222 Non-pressure chronic ulcer of left calf with fat layer exposed: Secondary | ICD-10-CM

## 2024-06-10 DIAGNOSIS — R600 Localized edema: Secondary | ICD-10-CM

## 2024-06-10 DIAGNOSIS — I890 Lymphedema, not elsewhere classified: Secondary | ICD-10-CM | POA: Insufficient documentation

## 2024-06-10 DIAGNOSIS — L97221 Non-pressure chronic ulcer of left calf limited to breakdown of skin: Secondary | ICD-10-CM | POA: Insufficient documentation

## 2024-06-10 NOTE — Progress Notes (Signed)
 [facility]    Wound Follow-Up - Provider Documentation    PATIENT: [name] DOB: Mar 31, 1966   MR #: [id number]  AGE: 58 y.o.    REFERRING MD:  Gregory Lamar DASEN, MD PRIMARY MD: Gregory Lamar DASEN, MD        Chief Complaint   Patient presents with    Wound Check    Edema      The patient was seen in the office today and a follow up evaluation was performed, including a history & focused exam.     [name] is a 58 year old black male who presents today with Patient reports he has had recurrent wounds and swelling of both lower legs for the past two years.  He was being treated at a wound center in [place] but moved to [place]  in July 2023.  He has been self treating with gauze/coban dressings and took a prescription for penicillin in January 2024.  PMH significant for morbid obesity, current tobacco user, hypertention, GERD and chronic back pain. Negative for diabetes.   The patient states his chief complaint is wounds of bilateral lower legs.    History of Present Illness   [name] is a 58 y.o. male who presents to [facility] wound healing center bilateral lower extremity lymphedema, edema and chronic left leg ulcers.  Patient denies fever, chills, nausea, vomiting, and leg pain.     Patient's right leg ulcer is healed however patient still have open left leg ulcer.  Patient has bilateral lower extremity lymphedema/leg edema.    Patient has been treated with Dakin's 10 minutes soaking, gentamicin, Zetuvit.  He is under multilayer compression for his leg for his leg swelling control.    Interval HPI 06/03/2024: Patient missed appointment secondary to parking issue last week.  Patient was treated with Dakin's 10 minutes soaking, Bactroban, Acticoat 7.  Patient is under Unna boot modified Coban 2 multilayer compression to control his leg swelling.    Interval HPI 06/10/2024: Patient was treated with Dakin's 10 minutes soaking, Bactroban,  Acticoat 7.  Patient is under Radio broadcast assistant modified Coban 2 multilayer compression to control his leg swelling    Past Medical History     Past Medical History:   Diagnosis Date    GERD (gastroesophageal reflux disease)     Hypertension     Lower back pain        Past Surgical History   History reviewed. No pertinent surgical history.    Family History   No family history on file.    Social History   Social History[1]    Allergies   Allergies[2]    Medications   Current Medications[3]    Physical Exam     Vitals:    06/10/24 1104   BP: 143/77   Pulse: 71       Body mass index is 54.78 kg/m.    General:  Patient appears their stated age, well-nourished.  Alert and in no apparent distress.  Lungs: Respiratory effort unlabored, no signs of distress  Vascular: normal capillary refill  Skin: see focused wound care exam  Neuro: AAOx3 normal speech and appropriate affect, see wound care exam for sensory exam    Focused Wound Care Exam   Wound Assessment:   Wound 10/31/22 Lower Leg;Calf Left;Posterior (Active)   Date First Assessed: 10/31/22   Location: Lower Leg;Calf  Wound Location Orientation: Left;Posterior  Wound Description: lateral/posterior  Present on Original  Admission: Yes      Assessments 06/10/2024 11:14 AM   Wound Image      Wound Base Description Dry;Partial Thickness;Scattered   Peri-wound Description Hyperpigmented;Ecchymosis   Shape scattered   Wound Length (cm) 1 cm   Wound Width (cm) 3 cm   Wound Depth (cm) 0.1 cm   Wound Surface Area (cm^2) 2.36 cm^2   Wound Volume (cm^3) 0.157 cm^3   Drainage Amount Small   Drainage Description Serosanguinous   Margins Defined edges   Treatments Cleansed;Debrided   Compression Wrap 1 Coban 2 or equivalent   Topical Pharmaceutical agent       Wound 10/31/20 Edema/Edema Management Lower Leg Anterior;Left (Active)   Date First Assessed/Time First Assessed: 10/31/20 0800   Primary Wound Type: Edema/Edema Management  Edema extremity: LLE  Edema Swelling: Pitting  Location:  Lower Leg  Wound Location Orientation: Anterior;Left      Assessments 06/10/2024 11:13 AM   Wound Image     Treatments Cleansed;Compression Wrap1   Compression Wrap 1 Coban 2 or equivalent   Topical Moisturizing cream       Wound 10/31/20 Edema/Edema Management Lower Leg Anterior;Right (Active)   Date First Assessed/Time First Assessed: 10/31/20 0800   Primary Wound Type: Edema/Edema Management  Edema extremity: RLE  Edema Swelling: Pitting  Location: Lower Leg  Wound Location Orientation: Anterior;Right      Assessments 06/10/2024 11:13 AM   Wound Image     Treatments Cleansed;Compression Wrap1   Compression Wrap 1 Coban 2 or equivalent   Topical Moisturizing cream       Edema  RLE Edema: 1+ barely detectable  R Foot Measurements(cm): 27 cm  R Ankle Measurements(cm): 26 cm  R Calf Measurements(cm): 49.5 cm  RLE Treatment: RLE Multilayer  RLE Multilayer Tx: Coban 2  LLE Edema: 1+ barely detectable  L Foot Measurements(cm): 26.5 cm  L Ankle Measurements(cm): 26.5 cm  L Calf Measurements(cm): 49 cm  LLE Treatment: LLE Multilayer  LLE Multilayer Tx: Coban 2        Procedure Note   No debridement needed    Procedures     Assessment & Plan   I examined the patient for an wound(s) that has been resistant to healing despite numerous interventions. The plan is to now examine any underlying characteristics that may impede healing. I decided that the wounds are not self limited and significantly impact the patient's daily living.    Wound Assessment and Plan:  The wounds are assessed as followed: Leg ulcers stable but more maceration noted today.    No sign of clinical infection.  Continue with Augmentin  875 mg twice daily.    After evaluation of the patient and ulcer/wound characteristics, I decided that debridement was indicated today.    Wound Care Plan:  Advanced Wound dressings appropriate to the needs of the wound have been initiated to provide a microenvironment which can enhance healing including maintaining a moist  environment, decreasing bioburden and insulating and protecting the wound.     Dakin's 10 minutes soaking, Bactroban, Acticoat 7 applied  Unnaboot modified Coban2 to applied to left leg and zinc Coflex 2 applied to right leg.    Vascular Status:   The patient's vascular status was assessed and it was determined that their vascular status is adequate for healing.     Edema Management:    The patient has edema to Bilateral lower extremities.  I reviewed the leg, ankle and foot circumference measurements to assess the patient's response to  compression. The edema is +3 pitting edema. Edema control is imperative for healing.  Patient instructed to elevate the lower extremities above the level of the heart when possible.  Active compression will be used to gain control of the patient's edema. Edema managed through use of Coban 2. I described the possible complications compression wraps and told the patient to remove them if they felt too tight or became soiled, but otherwise they will leave the wraps in place until the next visit..     Nutrition:    Nutrition status is adequate.  I made suggestions regarding appropriate protein intake, vitamin supplementation, and adequate hydration. Recommendations include healthy diet    Other comorbidities that may affect wound healing: Non-healing wounds are a symptom of their significant comorbid diseases. edema and lymphedema    Coordination of Care: I recommended none needed at this time    Patient Education:  Dressing management. S/S infection and to call clinic/ED precautions if any were to occur.   Patient verbalized understanding and agreed with POC.   Home health was not indicated at this time..    The patient will continue to be seen on a regular basis.  Follow up in clinic in 1 week and PRN interval nurse visits for dressing changes.    Roosvelt Needle, DPM  06/10/2024                [1]   Social History  Socioeconomic History    Marital status: Married   Tobacco Use    Smoking  status: Heavy Smoker     Current packs/day: 1.00     Types: Cigarettes    Smokeless tobacco: Current   Substance and Sexual Activity    Alcohol use: Yes     Comment: occasionally     Drug use: Yes     Comment: marijuana     Social Drivers of Health     Food Insecurity: Unknown (11/07/2022)    Hunger Vital Sign     Worried About Running Out of Food in the Last Year: Never true   Social Connections: Unknown (01/30/2022)    Received from [facility]    Social Network     Social Network: Not on file   Intimate Partner Violence: Unknown (12/22/2021)    Received from [facility]    HITS     Physically Hurt: Not on file     Insult or Talk Down To: Not on file     Threaten Physical Harm: Not on file     Scream or Curse: Not on file   [2] No Known Allergies  [3]   Current Outpatient Medications:     albuterol  sulfate HFA (PROVENTIL ) 108 (90 Base) MCG/ACT inhaler, Inhale 2 puffs into the lungs every 4 (four) hours as needed for Wheezing or Shortness of Breath (coughing) Dispense with spacer, Disp: 6.7 g, Rfl: 0    amlodipine-benazepril (LOTREL) 10-20 MG per capsule, Take 1 capsule by mouth daily, Disp: , Rfl:     amoxicillin -clavulanate (AUGMENTIN ) 875-125 MG per tablet, Take 1 tablet by mouth 2 (two) times daily for 10 days, Disp: 20 tablet, Rfl: 0    benzonatate  (TESSALON ) 100 MG capsule, Take 1 capsule (100 mg) by mouth 3 (three) times daily as needed for Cough, Disp: 15 capsule, Rfl: 0    candesartan (ATACAND) 16 MG tablet, Take 1 tablet (16 mg) by mouth 2 (two) times daily, Disp: , Rfl:     carvedilol (COREG) 12.5 MG tablet, Take 1  tablet (12.5 mg) by mouth nightly, Disp: , Rfl:     carvedilol (COREG) 25 MG tablet, Take 1 tablet (25 mg) by mouth daily In the morning, Disp: , Rfl:     cloNIDine (CATAPRES) 0.3 MG tablet, Take 1 tablet (0.3 mg) by mouth 2 (two) times daily, Disp: , Rfl:     furosemide (LASIX) 40 MG tablet, Take 1 tablet (40 mg) by mouth 2 (two) times daily, Disp: , Rfl:     hydrALAZINE (APRESOLINE) 50 MG  tablet, Take 1 tablet (50 mg) by mouth 2 (two) times daily, Disp: , Rfl:     hydrochlorothiazide (HYDRODIURIL) 25 MG tablet, Take 1 tablet (25 mg) by mouth daily, Disp: , Rfl:     naproxen  (NAPROSYN ) 500 MG tablet, Take 1 tablet (500 mg) by mouth 2 (two) times daily as needed, Disp: , Rfl:     omeprazole (PRILOSEC) 40 MG capsule, Take 1 capsule (40 mg) by mouth daily, Disp: , Rfl:     potassium chloride (KLOR-CON M20) 20 MEQ CR tablet, Take 1 tablet (20 mEq) by mouth once daily, Disp: , Rfl:

## 2024-06-10 NOTE — Progress Notes (Signed)
 [facility]  [facility]:  [address], [place], [place] [address]  T [phone]  F [phone]  Wound Follow-up - Nursing Documentation    PATIENT: [name] DOB: 10/15/1965   MR #: [id number]  AGE: 58 y.o.    REFERRING MD:  Elisabeth Lamar DASEN, MD PRIMARY MD: Elisabeth Lamar DASEN, MD      Chief Complaint   Patient presents with    Wound Check    Edema        Assessment & Plan   Patient arrived in the wound center on a wheel chair.     Case Management:  Patient identification verified using two identifiers.  This patient has been determined to be at risk for a fall, extra precautionary measures have been taken to ensure their safety.   RN reconciled meds and allergies.     Treatment:    POC: LLE: Dakin's Soak 10min, bactroban, Acticoat Flex 7, unna boot, lidex + vaseline to peeling areas, ABDs to bolster where needed, and coban 2  RLE: unna boot, lidex, abd to bolster, coban 2     RTC: 1 week    Additional Wound Procedures    Performed by: Helena Dines, RN  Authorized by: Orlan Munch, DPM  Associated wounds:   Wound 10/31/20 Edema/Edema Management Lower Leg Anterior;Left  Wound 10/31/20 Edema/Edema Management Lower Leg Anterior;Right    Body area:  Lower extremity  Lower extremity location:  R lower leg and L lower leg    Compression wrap type: multi-layer    Wrap location: bilateral    Wrap location equal debrided location?: yes    Debrided location: left    Multi-layer: coban 2     Procedure was performed in a clean field. The leg was washed with soap and water and then patted dry. Moisturizer was applied to the leg. A multi-layer compression bandage was applied per manufacturer's instructions. Patient tolerated procedure well and without complaints of pain or discomfort. Patient was educated regarding signs and symptoms of overcompression, including unrelieved pain, toes turning red or purple, or numbness in foot.  Patient was instructed to manually remove outer layer  (without scissors) and contact clinic immediately.       Patient Education:  - Plan of care and wound dressings.  - reviewed: Elevate legs for 30 mins, 3x/day, above the level of the heart. Above the level of the heart, is key here. The easiest way to achieve this is to lay down flat and prop your legs up with at least 2 pillows.  - Nutrition: with patients with chronic or slow-healing wounds, we recommend protein. Take your bodyweight, divide by 2 = daily protein goal, in grams.   - If wraps get wet/damaged, or if they're too tight, or if s/he has any questions: please call the [facility]. We are open M-F, 8:30-4:30, closed on weekends. If patient needs care during our off hours, please don't hesitate and go to Urgent Care or Emergency Room. Pt never to use scissors to remove wraps if too tight.   - Patient verbalized understanding.      Dines Helena, RN   06/10/2024

## 2024-06-17 ENCOUNTER — Ambulatory Visit: Attending: Foot & Ankle Surgery | Admitting: Foot & Ankle Surgery

## 2024-06-17 VITALS — BP 135/73 | HR 65 | Temp 98.2°F

## 2024-06-17 DIAGNOSIS — L97221 Non-pressure chronic ulcer of left calf limited to breakdown of skin: Secondary | ICD-10-CM | POA: Insufficient documentation

## 2024-06-17 DIAGNOSIS — I890 Lymphedema, not elsewhere classified: Secondary | ICD-10-CM | POA: Insufficient documentation

## 2024-06-17 DIAGNOSIS — L97222 Non-pressure chronic ulcer of left calf with fat layer exposed: Secondary | ICD-10-CM

## 2024-06-17 DIAGNOSIS — R600 Localized edema: Secondary | ICD-10-CM

## 2024-06-17 NOTE — Progress Notes (Signed)
 [facility]  [facility]:  [address], [place], [place] [address]  T [phone]  F [phone]  Wound Follow-up - Nursing Documentation    PATIENT: [name] DOB: [birth year]   MR #: [id number]  AGE: 58 y.o.    REFERRING MD:  Elisabeth Lamar DASEN, MD PRIMARY MD: Elisabeth Lamar DASEN, MD      No chief complaint on file.       Assessment & Plan   Patient arrived in the wound center on a wheel chair.     Case Management:  Patient identification verified using two identifiers.  This patient has been determined to be at risk for a fall, extra precautionary measures have been taken to ensure their safety.   RN reconciled meds and allergies.     Treatment:    POC: BLE: unna boot, lidex, abd to bolster, coban 2     RTC: 1 week    Additional Wound Procedures    Performed by: Celinda Arabia, RN  Authorized by: Orlan Munch, DPM  Associated wounds:   Wound 10/31/20 Edema/Edema Management Lower Leg Anterior;Left  Wound 10/31/20 Edema/Edema Management Lower Leg Anterior;Right    Body area:  Lower extremity  Lower extremity location:  R lower leg and L lower leg    Compression wrap type: multi-layer    Wrap location: left    Wrap location equal debrided location?: no    Multi-layer: 2 layer     Procedure was performed in a clean field. The leg was washed with soap and water and then patted dry. Moisturizer was applied to the leg. A multi-layer compression bandage was applied per manufacturer's instructions. Patient tolerated procedure well and without complaints of pain or discomfort. Patient was educated regarding signs and symptoms of overcompression, including unrelieved pain, toes turning red or purple, or numbness in foot.  Patient was instructed to manually remove outer layer (without scissors) and contact clinic immediately.       Patient Education:  - Plan of care and wound dressings.  - reviewed: Elevate legs for 30 mins, 3x/day, above the level of the heart. Above the level of  the heart, is key here. The easiest way to achieve this is to lay down flat and prop your legs up with at least 2 pillows.  - Nutrition: with patients with chronic or slow-healing wounds, we recommend protein. Take your bodyweight, divide by 2 = daily protein goal, in grams.   - If wraps get wet/damaged, or if they're too tight, or if s/he has any questions: please call the [facility]. We are open M-F, 8:30-4:30, closed on weekends. If patient needs care during our off hours, please don't hesitate and go to Urgent Care or Emergency Room. Pt never to use scissors to remove wraps if too tight.   - Patient verbalized understanding.      Arabia Celinda, RN   06/17/2024

## 2024-06-17 NOTE — Progress Notes (Signed)
 Ambulatory [name]    Wound Follow-Up - Provider Documentation    PATIENT: [name] DOB: Jun 23, 1966   MR #: [id number]  AGE: 58 y.o.    REFERRING MD:  Elisabeth Lamar DASEN, MD PRIMARY MD: Elisabeth Lamar DASEN, MD        Chief Complaint   Patient presents with    Wound Check    Edema      The patient was seen in the office today and a follow up evaluation was performed, including a history & focused exam.     [name] is a 58 year old black male who presents today with Patient reports he has had recurrent wounds and swelling of both lower legs for the past two years.  He was being treated at a wound center in [place] but moved to [place]  in July 2023.  He has been self treating with gauze/coban dressings and took a prescription for penicillin in January 2024.  PMH significant for morbid obesity, current tobacco user, hypertention, GERD and chronic back pain. Negative for diabetes.   The patient states his chief complaint is wounds of bilateral lower legs.    History of Present Illness   GRAVIEL [name] is a 58 y.o. male who presents to [facility] wound healing center bilateral lower extremity lymphedema, edema and chronic left leg ulcers.  Patient denies fever, chills, nausea, vomiting, and leg pain.     Patient's right leg ulcer is healed however patient still have open left leg ulcer.  Patient has bilateral lower extremity lymphedema/leg edema.    Patient has been treated with Dakin's 10 minutes soaking, gentamicin, Zetuvit.  He is under multilayer compression for his leg for his leg swelling control.    Interval HPI 06/10/2024: Patient was treated with Dakin's 10 minutes soaking, Bactroban, Acticoat 7.  Patient is under Unna boot modified Coban 2 multilayer compression to control his leg swelling.    Interval HPI 06/17/2024: Patient was treated with Dakin's 10 minutes soaking, Bactroban, Acticoat 7.  Patient is under Unna boot modified Coban 2 compression to  control his leg swelling.  Patient has no new complaint at this time.    Past Medical History     Past Medical History:   Diagnosis Date    GERD (gastroesophageal reflux disease)     Hypertension     Lower back pain        Past Surgical History   History reviewed. No pertinent surgical history.    Family History   No family history on file.    Social History   Social History[1]    Allergies   Allergies[2]    Medications   Current Medications[3]    Physical Exam     Vitals:    06/17/24 1050   BP: 135/73   Pulse: 65   Temp: 98.2 F (36.8 C)       There is no height or weight on file to calculate BMI.    General:  Patient appears their stated age, well-nourished.  Alert and in no apparent distress.  Lungs: Respiratory effort unlabored, no signs of distress  Vascular: normal capillary refill  Skin: see focused wound care exam  Neuro: AAOx3 normal speech and appropriate affect, see wound care exam for sensory exam    Focused Wound Care Exam   Wound Assessment:   Wound 10/31/22 Lower Leg;Calf Left;Posterior (Active)   Date First Assessed: 10/31/22   Location: Lower Leg;Calf  Wound Location  Orientation: Left;Posterior  Wound Description: lateral/posterior  Present on Original Admission: Yes      Assessments 06/17/2024 11:01 AM   Wound Image      Wound Base Description Dry;Peeling   Peri-wound Description Dry;Peeling/Flaking   Wound Length (cm) 0.1 cm   Wound Width (cm) 0.1 cm   Wound Depth (cm) 0.1 cm   Wound Surface Area (cm^2) 0.01 cm^2   Wound Volume (cm^3) 0.001 cm^3   Closure Open   Drainage Amount None   Tunneling No   Undermining  No   Margins Defined edges   Treatments Cleansed   Topical Moisture Barrier;Pharmaceutical agent       Wound 10/31/20 Edema/Edema Management Lower Leg Anterior;Left (Active)   Date First Assessed/Time First Assessed: 10/31/20 0800   Primary Wound Type: Edema/Edema Management  Edema extremity: LLE  Edema Swelling: Pitting  Location: Lower Leg  Wound Location Orientation: Anterior;Left       Assessments 06/17/2024 11:01 AM   Wound Image     Treatments Cleansed   Topical Moisturizing cream       Wound 10/31/20 Edema/Edema Management Lower Leg Anterior;Right (Active)   Date First Assessed/Time First Assessed: 10/31/20 0800   Primary Wound Type: Edema/Edema Management  Edema extremity: RLE  Edema Swelling: Pitting  Location: Lower Leg  Wound Location Orientation: Anterior;Right      Assessments 06/17/2024 11:01 AM   Wound Image     Treatments Cleansed   Topical Moisturizing cream     Wound 10/31/22 Lower Leg;Calf Left;Posterior (Active)   Date First Assessed: 10/31/22   Location: Lower Leg;Calf  Wound Location Orientation: Left;Posterior  Wound Description: lateral/posterior  Present on Original Admission: Yes      Assessments 06/17/2024 11:01 AM   Wound Image      Wound Base Description Dry;Peeling   Peri-wound Description Dry;Peeling/Flaking   Wound Length (cm) 0.1 cm   Wound Width (cm) 0.1 cm   Wound Depth (cm) 0.1 cm   Wound Surface Area (cm^2) 0.01 cm^2   Wound Volume (cm^3) 0.001 cm^3   Closure Open   Drainage Amount None   Tunneling No   Undermining  No   Margins Defined edges   Treatments Cleansed   Topical Moisture Barrier;Pharmaceutical agent       Wound 10/31/20 Edema/Edema Management Lower Leg Anterior;Left (Active)   Date First Assessed/Time First Assessed: 10/31/20 0800   Primary Wound Type: Edema/Edema Management  Edema extremity: LLE  Edema Swelling: Pitting  Location: Lower Leg  Wound Location Orientation: Anterior;Left      Assessments 06/17/2024 11:01 AM   Wound Image     Treatments Cleansed   Topical Moisturizing cream       Wound 10/31/20 Edema/Edema Management Lower Leg Anterior;Right (Active)   Date First Assessed/Time First Assessed: 10/31/20 0800   Primary Wound Type: Edema/Edema Management  Edema extremity: RLE  Edema Swelling: Pitting  Location: Lower Leg  Wound Location Orientation: Anterior;Right      Assessments 06/17/2024 11:01 AM   Wound Image     Treatments Cleansed   Topical  Moisturizing cream     Wound 10/31/22 Lower Leg;Calf Left;Posterior (Active)   Date First Assessed: 10/31/22   Location: Lower Leg;Calf  Wound Location Orientation: Left;Posterior  Wound Description: lateral/posterior  Present on Original Admission: Yes      Assessments 06/17/2024 11:01 AM   Wound Image      Wound Base Description Dry;Peeling   Peri-wound Description Dry;Peeling/Flaking   Wound Length (cm) 0.1 cm   Wound Width (  cm) 0.1 cm   Wound Depth (cm) 0.1 cm   Wound Surface Area (cm^2) 0.01 cm^2   Wound Volume (cm^3) 0.001 cm^3   Closure Open   Drainage Amount None   Tunneling No   Undermining  No   Margins Defined edges   Treatments Cleansed   Topical Moisture Barrier;Pharmaceutical agent       Wound 10/31/20 Edema/Edema Management Lower Leg Anterior;Left (Active)   Date First Assessed/Time First Assessed: 10/31/20 0800   Primary Wound Type: Edema/Edema Management  Edema extremity: LLE  Edema Swelling: Pitting  Location: Lower Leg  Wound Location Orientation: Anterior;Left      Assessments 06/17/2024 11:01 AM   Wound Image     Treatments Cleansed   Topical Moisturizing cream       Wound 10/31/20 Edema/Edema Management Lower Leg Anterior;Right (Active)   Date First Assessed/Time First Assessed: 10/31/20 0800   Primary Wound Type: Edema/Edema Management  Edema extremity: RLE  Edema Swelling: Pitting  Location: Lower Leg  Wound Location Orientation: Anterior;Right      Assessments 06/17/2024 11:01 AM   Wound Image     Treatments Cleansed   Topical Moisturizing cream     Wound 10/31/22 Lower Leg;Calf Left;Posterior (Active)   Date First Assessed: 10/31/22   Location: Lower Leg;Calf  Wound Location Orientation: Left;Posterior  Wound Description: lateral/posterior  Present on Original Admission: Yes      Assessments 06/17/2024 11:01 AM   Wound Image      Wound Base Description Dry;Peeling   Peri-wound Description Dry;Peeling/Flaking   Wound Length (cm) 0.1 cm   Wound Width (cm) 0.1 cm   Wound Depth (cm) 0.1 cm   Wound  Surface Area (cm^2) 0.01 cm^2   Wound Volume (cm^3) 0.001 cm^3   Closure Open   Drainage Amount None   Tunneling No   Undermining  No   Margins Defined edges   Treatments Cleansed   Topical Moisture Barrier;Pharmaceutical agent       Wound 10/31/20 Edema/Edema Management Lower Leg Anterior;Left (Active)   Date First Assessed/Time First Assessed: 10/31/20 0800   Primary Wound Type: Edema/Edema Management  Edema extremity: LLE  Edema Swelling: Pitting  Location: Lower Leg  Wound Location Orientation: Anterior;Left      Assessments 06/17/2024 11:01 AM   Wound Image     Treatments Cleansed   Topical Moisturizing cream       Wound 10/31/20 Edema/Edema Management Lower Leg Anterior;Right (Active)   Date First Assessed/Time First Assessed: 10/31/20 0800   Primary Wound Type: Edema/Edema Management  Edema extremity: RLE  Edema Swelling: Pitting  Location: Lower Leg  Wound Location Orientation: Anterior;Right      Assessments 06/17/2024 11:01 AM   Wound Image     Treatments Cleansed   Topical Moisturizing cream     Wound 10/31/22 Lower Leg;Calf Left;Posterior (Active)   Date First Assessed: 10/31/22   Location: Lower Leg;Calf  Wound Location Orientation: Left;Posterior  Wound Description: lateral/posterior  Present on Original Admission: Yes      Assessments 06/17/2024 11:01 AM   Wound Image      Wound Base Description Dry;Peeling   Peri-wound Description Dry;Peeling/Flaking   Wound Length (cm) 0.1 cm   Wound Width (cm) 0.1 cm   Wound Depth (cm) 0.1 cm   Wound Surface Area (cm^2) 0.01 cm^2   Wound Volume (cm^3) 0.001 cm^3   Closure Open   Drainage Amount None   Tunneling No   Undermining  No   Margins Defined edges   Treatments  Cleansed   Topical Moisture Barrier;Pharmaceutical agent       Wound 10/31/20 Edema/Edema Management Lower Leg Anterior;Left (Active)   Date First Assessed/Time First Assessed: 10/31/20 0800   Primary Wound Type: Edema/Edema Management  Edema extremity: LLE  Edema Swelling: Pitting  Location: Lower Leg   Wound Location Orientation: Anterior;Left      Assessments 06/17/2024 11:01 AM   Wound Image     Treatments Cleansed   Topical Moisturizing cream       Wound 10/31/20 Edema/Edema Management Lower Leg Anterior;Right (Active)   Date First Assessed/Time First Assessed: 10/31/20 0800   Primary Wound Type: Edema/Edema Management  Edema extremity: RLE  Edema Swelling: Pitting  Location: Lower Leg  Wound Location Orientation: Anterior;Right      Assessments 06/17/2024 11:01 AM   Wound Image     Treatments Cleansed   Topical Moisturizing cream     Wound 10/31/22 Lower Leg;Calf Left;Posterior (Active)   Date First Assessed: 10/31/22   Location: Lower Leg;Calf  Wound Location Orientation: Left;Posterior  Wound Description: lateral/posterior  Present on Original Admission: Yes      Assessments 06/17/2024 11:01 AM   Wound Image      Wound Base Description Dry;Peeling   Peri-wound Description Dry;Peeling/Flaking   Wound Length (cm) 0.1 cm   Wound Width (cm) 0.1 cm   Wound Depth (cm) 0.1 cm   Wound Surface Area (cm^2) 0.01 cm^2   Wound Volume (cm^3) 0.001 cm^3   Closure Open   Drainage Amount None   Tunneling No   Undermining  No   Margins Defined edges   Treatments Cleansed   Topical Moisture Barrier;Pharmaceutical agent       Wound 10/31/20 Edema/Edema Management Lower Leg Anterior;Left (Active)   Date First Assessed/Time First Assessed: 10/31/20 0800   Primary Wound Type: Edema/Edema Management  Edema extremity: LLE  Edema Swelling: Pitting  Location: Lower Leg  Wound Location Orientation: Anterior;Left      Assessments 06/17/2024 11:01 AM   Wound Image     Treatments Cleansed   Topical Moisturizing cream       Wound 10/31/20 Edema/Edema Management Lower Leg Anterior;Right (Active)   Date First Assessed/Time First Assessed: 10/31/20 0800   Primary Wound Type: Edema/Edema Management  Edema extremity: RLE  Edema Swelling: Pitting  Location: Lower Leg  Wound Location Orientation: Anterior;Right      Assessments 06/17/2024 11:01 AM    Wound Image     Treatments Cleansed   Topical Moisturizing cream     Wound 10/31/22 Lower Leg;Calf Left;Posterior (Active)   Date First Assessed: 10/31/22   Location: Lower Leg;Calf  Wound Location Orientation: Left;Posterior  Wound Description: lateral/posterior  Present on Original Admission: Yes      Assessments 06/17/2024 11:01 AM   Wound Image      Wound Base Description Dry;Peeling   Peri-wound Description Dry;Peeling/Flaking   Wound Length (cm) 0.1 cm   Wound Width (cm) 0.1 cm   Wound Depth (cm) 0.1 cm   Wound Surface Area (cm^2) 0.01 cm^2   Wound Volume (cm^3) 0.001 cm^3   Closure Open   Drainage Amount None   Tunneling No   Undermining  No   Margins Defined edges   Treatments Cleansed   Topical Moisture Barrier;Pharmaceutical agent       Wound 10/31/20 Edema/Edema Management Lower Leg Anterior;Left (Active)   Date First Assessed/Time First Assessed: 10/31/20 0800   Primary Wound Type: Edema/Edema Management  Edema extremity: LLE  Edema Swelling: Pitting  Location: Lower Leg  Wound Location Orientation: Anterior;Left      Assessments 06/17/2024 11:01 AM   Wound Image     Treatments Cleansed   Topical Moisturizing cream       Wound 10/31/20 Edema/Edema Management Lower Leg Anterior;Right (Active)   Date First Assessed/Time First Assessed: 10/31/20 0800   Primary Wound Type: Edema/Edema Management  Edema extremity: RLE  Edema Swelling: Pitting  Location: Lower Leg  Wound Location Orientation: Anterior;Right      Assessments 06/17/2024 11:01 AM   Wound Image     Treatments Cleansed   Topical Moisturizing cream         Edema  R Foot Measurements(cm): 27 cm  R Ankle Measurements(cm): 27 cm  R Calf Measurements(cm): 48.5 cm  L Foot Measurements(cm): 26.5 cm  L Ankle Measurements(cm): 27 cm  L Calf Measurements(cm): 47.5 cm          Procedure Note   No debridement needed    Procedures     Assessment & Plan   I examined the patient for an wound(s) that has been resistant to healing despite numerous interventions. The  plan is to now examine any underlying characteristics that may impede healing. I decided that the wounds are not self limited and significantly impact the patient's daily living.    Wound Assessment and Plan:  The wounds are assessed as followed: Leg ulcers stable but more maceration noted today.    No sign of clinical infection.  Continue with Augmentin  875 mg twice daily.    After evaluation of the patient and ulcer/wound characteristics, I decided that debridement was indicated today.    Wound Care Plan:  Advanced Wound dressings appropriate to the needs of the wound have been initiated to provide a microenvironment which can enhance healing including maintaining a moist environment, decreasing bioburden and insulating and protecting the wound.     Dakin's 10 minutes soaking, Unna boot applied directly to his leg.  Coban 2 modify multilayer compression applied.    Vascular Status:   The patient's vascular status was assessed and it was determined that their vascular status is adequate for healing.     Edema Management:    The patient has edema to Bilateral lower extremities.  I reviewed the leg, ankle and foot circumference measurements to assess the patient's response to compression. The edema is +3 pitting edema. Edema control is imperative for healing.  Patient instructed to elevate the lower extremities above the level of the heart when possible.  Active compression will be used to gain control of the patient's edema. Edema managed through use of Coban 2. I described the possible complications compression wraps and told the patient to remove them if they felt too tight or became soiled, but otherwise they will leave the wraps in place until the next visit..     Nutrition:    Nutrition status is adequate.  I made suggestions regarding appropriate protein intake, vitamin supplementation, and adequate hydration. Recommendations include healthy diet    Other comorbidities that may affect wound healing:  Non-healing wounds are a symptom of their significant comorbid diseases. edema and lymphedema    Coordination of Care: I recommended none needed at this time    Patient Education:  Dressing management. S/S infection and to call clinic/ED precautions if any were to occur.   Patient verbalized understanding and agreed with POC.   Home health was not indicated at this time..    The patient will continue to be seen on a regular basis.  Follow up in clinic in 1 week and PRN interval nurse visits for dressing changes.    Roosvelt Needle, DPM  06/17/2024                    [1]   Social History  Socioeconomic History    Marital status: Married   Tobacco Use    Smoking status: Former     Current packs/day: 0.00     Average packs/day: 1 pack/day for 29.0 years (29.0 ttl pk-yrs)     Types: Cigarettes     Start date: 89     Quit date: 2024     Years since quitting: 1.7    Smokeless tobacco: Current   Vaping Use    Vaping status: Former   Substance and Sexual Activity    Alcohol use: Yes     Comment: occasionally     Drug use: Yes     Comment: marijuana     Social Drivers of Health     Food Insecurity: Unknown (11/07/2022)    Hunger Vital Sign     Worried About Running Out of Food in the Last Year: Never true   Social Connections: Unknown (01/30/2022)    Received from [facility]    Social Network     Social Network: Not on file   Intimate Partner Violence: Unknown (12/22/2021)    Received from [facility]    HITS     Physically Hurt: Not on file     Insult or Talk Down To: Not on file     Threaten Physical Harm: Not on file     Scream or Curse: Not on file   [2] No Known Allergies  [3]   Current Outpatient Medications:     albuterol  sulfate HFA (PROVENTIL ) 108 (90 Base) MCG/ACT inhaler, Inhale 2 puffs into the lungs every 4 (four) hours as needed for Wheezing or Shortness of Breath (coughing) Dispense with spacer, Disp: 6.7 g, Rfl: 0    amlodipine-benazepril (LOTREL) 10-20 MG per capsule, Take 1 capsule by mouth daily, Disp: ,  Rfl:     benzonatate  (TESSALON ) 100 MG capsule, Take 1 capsule (100 mg) by mouth 3 (three) times daily as needed for Cough, Disp: 15 capsule, Rfl: 0    candesartan (ATACAND) 16 MG tablet, Take 1 tablet (16 mg) by mouth 2 (two) times daily, Disp: , Rfl:     carvedilol (COREG) 12.5 MG tablet, Take 1 tablet (12.5 mg) by mouth nightly, Disp: , Rfl:     carvedilol (COREG) 25 MG tablet, Take 1 tablet (25 mg) by mouth daily In the morning, Disp: , Rfl:     cloNIDine (CATAPRES) 0.3 MG tablet, Take 1 tablet (0.3 mg) by mouth 2 (two) times daily, Disp: , Rfl:     furosemide (LASIX) 40 MG tablet, Take 1 tablet (40 mg) by mouth 2 (two) times daily, Disp: , Rfl:     hydrALAZINE (APRESOLINE) 50 MG tablet, Take 1 tablet (50 mg) by mouth 2 (two) times daily, Disp: , Rfl:     hydrochlorothiazide (HYDRODIURIL) 25 MG tablet, Take 1 tablet (25 mg) by mouth daily, Disp: , Rfl:     naproxen  (NAPROSYN ) 500 MG tablet, Take 1 tablet (500 mg) by mouth 2 (two) times daily as needed, Disp: , Rfl:     omeprazole (PRILOSEC) 40 MG capsule, Take 1 capsule (40 mg) by mouth daily, Disp: , Rfl:     potassium chloride (KLOR-CON M20) 20 MEQ CR tablet, Take 1  tablet (20 mEq) by mouth once daily, Disp: , Rfl:

## 2024-06-24 ENCOUNTER — Emergency Department: Admission: EM | Admit: 2024-06-24 | Discharge: 2024-06-24 | Disposition: A | Discharge status: Home / Self Care

## 2024-06-24 ENCOUNTER — Ambulatory Visit: Admitting: Foot & Ankle Surgery

## 2024-06-24 VITALS — BP 146/88 | HR 82 | Temp 97.8°F | Resp 18 | Wt >= 6400 oz

## 2024-06-24 DIAGNOSIS — R030 Elevated blood-pressure reading, without diagnosis of hypertension: Secondary | ICD-10-CM | POA: Insufficient documentation

## 2024-06-24 DIAGNOSIS — L97222 Non-pressure chronic ulcer of left calf with fat layer exposed: Secondary | ICD-10-CM

## 2024-06-24 DIAGNOSIS — R600 Localized edema: Secondary | ICD-10-CM

## 2024-06-24 DIAGNOSIS — I890 Lymphedema, not elsewhere classified: Secondary | ICD-10-CM | POA: Insufficient documentation

## 2024-06-24 DIAGNOSIS — N390 Urinary tract infection, site not specified: Secondary | ICD-10-CM | POA: Insufficient documentation

## 2024-06-24 LAB — LAB USE ONLY - CBC WITH DIFFERENTIAL
Absolute Basophils: 0.03 x10 3/uL (ref 0.00–0.08)
Absolute Eosinophils: 0.16 x10 3/uL (ref 0.00–0.44)
Absolute Immature Granulocytes: 0.02 x10 3/uL (ref 0.00–0.07)
Absolute Lymphocytes: 1.11 x10 3/uL (ref 0.42–3.22)
Absolute Monocytes: 0.68 x10 3/uL (ref 0.21–0.85)
Absolute Neutrophils: 4.84 x10 3/uL (ref 1.10–6.33)
Absolute nRBC: 0 x10 3/uL (ref <=0.00)
Basophils %: 0.4 %
Eosinophils %: 2.3 %
Hematocrit: 42 % (ref 37.6–49.6)
Hemoglobin: 13.9 g/dL (ref 12.5–17.1)
Immature Granulocytes %: 0.3 %
Lymphocytes %: 16.2 %
MCH: 28.6 pg (ref 25.1–33.5)
MCHC: 33.1 g/dL (ref 31.5–35.8)
MCV: 86.4 fL (ref 78.0–96.0)
MPV: 10.2 fL (ref 8.9–12.5)
Monocytes %: 9.9 %
Neutrophils %: 70.9 %
Platelet Count: 269 x10 3/uL (ref 142–346)
Preliminary Absolute Neutrophil Count: 4.84 x10 3/uL (ref 1.10–6.33)
RBC: 4.86 x10 6/uL (ref 4.20–5.90)
RDW: 16 % — ABNORMAL HIGH (ref 11–15)
WBC: 6.84 x10 3/uL (ref 3.10–9.50)
nRBC %: 0 /100{WBCs} (ref <=0.0)

## 2024-06-24 LAB — URINALYSIS WITH REFLEX TO MICROSCOPIC EXAM - REFLEX TO CULTURE
Urine Bilirubin: NEGATIVE
Urine Blood: NEGATIVE
Urine Glucose: NEGATIVE
Urine Ketones: NEGATIVE mg/dL
Urine Nitrite: NEGATIVE
Urine Specific Gravity: 1.029 (ref 1.001–1.035)
Urine Urobilinogen: NORMAL mg/dL (ref 0.2–2.0)
Urine pH: 6.5 (ref 5.0–8.0)

## 2024-06-24 LAB — COMPREHENSIVE METABOLIC PANEL
ALT: 15 U/L (ref <=55)
AST (SGOT): 17 U/L (ref <=41)
Albumin/Globulin Ratio: 1.1 (ref 0.9–2.2)
Albumin: 3.5 g/dL — ABNORMAL LOW (ref 3.8–5.0)
Alkaline Phosphatase: 62 U/L (ref 37–117)
Anion Gap: 7 (ref 5.0–15.0)
BUN: 17 mg/dL (ref 9–28)
Bilirubin, Total: 0.4 mg/dL (ref 0.2–1.2)
CO2: 25 meq/L (ref 17–29)
Calcium: 8.9 mg/dL (ref 8.5–10.5)
Chloride: 107 meq/L (ref 99–111)
Creatinine: 0.9 mg/dL (ref 0.5–1.5)
GFR: >60 mL/min/1.73 m2 (ref >=60.0)
Globulin: 3.3 g/dL (ref 2.0–3.6)
Glucose: 99 mg/dL (ref 70–100)
Potassium: 4.9 meq/L (ref 3.5–5.3)
Protein, Total: 6.8 g/dL (ref 6.0–8.3)
Sodium: 139 meq/L (ref 135–145)

## 2024-06-24 LAB — LAB USE ONLY - URINE GRAY CULTURE HOLD TUBE

## 2024-06-24 LAB — LIPASE: Lipase: 9 U/L (ref 8–78)

## 2024-06-24 MED ORDER — CEFUROXIME AXETIL 250 MG PO TABS
250.0000 mg | ORAL_TABLET | Freq: Two times a day (BID) | ORAL | 0 refills | Status: DC
Start: 2024-06-24 — End: 2024-06-27

## 2024-06-24 MED ORDER — CEFUROXIME AXETIL 250 MG PO TABS
250.0000 mg | ORAL_TABLET | Freq: Once | ORAL | Status: AC
Start: 2024-06-24 — End: 2024-06-24
  Administered 2024-06-24: 250 mg via ORAL
  Filled 2024-06-24: qty 1

## 2024-06-24 NOTE — Patient Instructions (Signed)
 DISCHARGE INSTRUCTIONS  [facility]:  [address], [place], [place] [address]  T [phone]  F [phone]    PATIENT: [name] DOB: 07-18-1966   MR #: [id number]  AGE: 58 y.o.    REFERRING MD:  Elisabeth Lamar DASEN, MD PRIMARY MD: Elisabeth Lamar DASEN, MD      June 24, 2024      Thank you for choosing the [facility] for your [facility] needs.  To insure your continued recovery, it is important that you maintain certain aspects of your care at home.    The healed area is more vulnerable to reinjury/ulceration.    Call the clinic if:  The area becomes red, hot, swollen and is draining  You notice additional skin breakdown that does not resolve in 3 weeks    Other instructions:    - Continue with skin care:  Apply moisturizing lotion daily to healed area.  - Continue elevation:  Elevate your legs above the level of your heart several times a day.  - Continue edema control:  Apply recommended compression socks or stockings every morning.  Keep them on until you go to bed and ensure there is no wrinkling during the day.  Replace your compression garments every 6 months.      Please contact us  if you have any questions, if you show signs that the wound is returning, or if you develop new wounds.      Clinician: Alexa Means, RN

## 2024-06-24 NOTE — ED Provider Notes (Signed)
 [facility]  Emergency Department Physician Note      Diagnosis/Disposition     ED Disposition:  Discharge    ED Diagnosis:     Acute lower UTI  Elevated blood pressure reading    Discharge Medication List as of 06/24/2024  1:56 PM        START taking these medications    Details   cefuroxime  (CEFTIN ) 250 MG tablet Take 1 tablet (250 mg) by mouth 2 (two) times daily for 7 days, Starting Wed 06/24/2024, Until Wed 07/01/2024, E-Rx             History of Present Illness      Chief Complaint: Abdominal Pain and Urinary Frequency     58 y.o. male with past medical history as below   History of Present Illness  [name] is a 58 year old male who presents with symptoms suggestive of a urinary tract infection.    He experiences a tingling sensation after urination, described as a 'tingle' leading to a feeling of pressure, for approximately one week. There is no burning sensation during urination. He completed a course of amoxicillin  with clavulanic acid last week for a leg issue, but the urinary symptoms persist. There is no fever, vomiting, flank pain, or abdominal pain. He feels a little pressure in the abdominal area but no significant pain. There is no discharge or swelling in the scrotal area. He is currently on blood pressure medication, which he took last night and this morning. His blood pressure is higher during this visit compared to a recent check at wound care. There is no dizziness, chest pain, or shortness of breath. No nausea or vomiting.    Independent Historian (other than patient): No  Additional History Provided by Independent Historian:      Physical Exam     ED Triage Vitals   Encounter Vitals Group      BP 06/24/24 1146 (!) 196/102      Girls Systolic BP Percentile --       Girls Diastolic BP Percentile --       Boys Systolic BP Percentile --       Boys Diastolic BP Percentile --       Heart Rate 06/24/24 1146 75      Resp Rate 06/24/24 1146 18      Temp 06/24/24 1146 98.3 F  (36.8 C)      Temp src 06/24/24 1440 Oral      SpO2 06/24/24 1146 97 %      Weight --       Height --       Head Circumference --       Peak Flow --       Pain Score 06/24/24 1307 0      Pain Loc --       Pain Education --       Exclude from Growth Chart --       Physical Exam   Physical Exam      General: Well developed, well nourished, appears comfortable  Head: Normocephalic, atraumatic.  Eyes: Extra-ocular motions intact. Pupils are equal and round and reactive to light bilaterally. Sclera are non-icteric and the conjunctiva are pink bilaterally.  ENT: Oropharynx clear and without edema, injection nor exudate. The uvula is midline. There is no appreciable cervical lymphadenopathy.   Respiratory: No respiratory distress. Lungs are clear to auscultation bilaterally with good air exchange.  CV: Regular rate and rhythm. Normal S1/S2.  No murmur, gallop or rub. No edema is noted in either leg.  Abdomen: Soft and nondistended. No tenderness to palpation. Bowel sounds are present and normal.   MSK: No swelling, deformity or cyanosis of the upper or lower extremities bilaterally.   Neuro: Alert and oriented x 3, symmetric face with clear speech. Strength normal and equal in the upper and lower extremities bilaterally.  Sensation to light touch is preserved. Normal cerebellar function.  Skin: Normal color. Warm and dry.  Psych: Normal affect and thought. Alert and oriented to person, place and time.      Medical Decision Making        PRIMARY PROBLEM LIST      1. Acute illness/injury with risk to life or bodily function (based on differential diagnosis or evaluation) DIAGNOSIS: UTI   Chronic Illness Impacting Care of the above problem: Hypertension and Obesity Increases complexity of evaluation, Increases the risk of severe disease, and Increase the risk of disease progression   Differential Diagnosis: Dysuria:  STI, UTI, stone     DISCUSSION      Assessment & Plan  Urinary tract infection    Urinalysis and symptoms  suggest a urinary tract infection, possibly not covered by previous antibiotics.  No signs of pyelonephritis.  Prescribe oral antibiotics and administer one dose in the emergency department.     Hypertension    Blood pressure is elevated at 183/88, but he is asymptomatic and on medication. This may be white coat hypertension. Advise monitoring blood pressure and follow up with primary care doctor.    The patient was deemed stable for discharge. They were given strict return precautions as it relates to their presumed diagnosis, verbalized understanding of these precautions and agreed to follow up as instructed. All questions were answered prior to discharge.    Additional Notes                                            Vital Signs: Reviewed the patient's vital signs.   Nursing Notes: Reviewed and utilized available nursing notes.   Medical Records Reviewed: Reviewed available past medical records.   Counseling: The emergency provider has spoken with the patient and discussed today's findings, in addition to providing specific details for the plan of care. Questions are answered and there is agreement with the plan.     CRITICAL CARE/PROCEDURES    Procedures         CARDIAC STUDIES      The following cardiac studies were independently interpreted by me the Emergency Medicine Provider. For full cardiac study results please see chart.                                                                   EMERGENCY IMAGING STUDIES      The following imaging studies were independently interpreted by me (emergency medicine provider):                                       Supplemental Encounter Data   Medical History[1]  Past Surgical  History[2]  Social History[3]  Family History[4]  Allergies[5]    Medications Administered:  Medications   cefuroxime  (CEFTIN ) tablet 250 mg (250 mg Oral Given 06/24/24 1435)     Laboratory and Imaging Studies:  Results for orders placed or performed during the hospital encounter of 06/24/24  (from the past 24 hours)   Comprehensive Metabolic Panel    Collection Time: 06/24/24 12:19 PM   Result Value    Glucose 99    BUN 17    Creatinine 0.9    Sodium 139    Potassium 4.9    Chloride 107    CO2 25    Calcium 8.9    Anion Gap 7.0    GFR >60.0    AST (SGOT) 17    ALT 15    Alkaline Phosphatase 62    Albumin 3.5 (L)    Protein, Total 6.8    Globulin 3.3    Albumin/Globulin Ratio 1.1    Bilirubin, Total 0.4    Collection Time: 06/24/24 12:19 PM   Result Value    Lipase 9   CBC with Differential (Component)    Collection Time: 06/24/24 12:19 PM   Result Value    WBC 6.84    Hemoglobin 13.9    Hematocrit 42.0    Platelet Count 269    MPV 10.2    RBC 4.86    MCV 86.4    MCH 28.6    MCHC 33.1    RDW 16 (H)    nRBC % 0.0    Absolute nRBC 0.00    Preliminary Absolute Neutrophil Count 4.84    Neutrophils % 70.9    Lymphocytes % 16.2    Monocytes % 9.9    Eosinophils % 2.3    Basophils % 0.4    Immature Granulocytes % 0.3    Absolute Neutrophils 4.84    Absolute Lymphocytes 1.11    Absolute Monocytes 0.68    Absolute Eosinophils 0.16    Absolute Basophils 0.03    Absolute Immature Granulocytes 0.02   Urinalysis with Reflex to Microscopic Exam and Culture    Collection Time: 06/24/24 12:28 PM    Specimen: Urine, Clean Catch   Result Value    Urine Color Straw    Urine Clarity Clear    Urine Specific Gravity 1.029    Urine pH 6.5    Urine Leukocyte Esterase Moderate (A)    Urine Nitrite Negative    Urine Protein 10= Trace (A)    Urine Glucose Negative    Urine Ketones Negative    Urine Urobilinogen Normal    Urine Bilirubin Negative    Urine Blood Negative    RBC, UA 3-5    Urine WBC Too numerous to count (A)    Urine Squamous Epithelial Cells 0-5   Urine Elnor Culture Hold Tube    Collection Time: 06/24/24 12:28 PM   Result Value    Extra Tube Hold for add-ons.     No orders to display                    [1]  Past Medical History:  Diagnosis Date   . GERD (gastroesophageal reflux disease)    . Hypertension    . Lower  back pain    [2]  No past surgical history on file.  [3]  Social History  Tobacco Use   . Smoking status: Former     Current packs/day: 0.00     Average  packs/day: 1 pack/day for 29.0 years (29.0 ttl pk-yrs)     Types: Cigarettes     Start date: 25     Quit date: 2024     Years since quitting: 1.7   . Smokeless tobacco: Current   Vaping Use   . Vaping status: Former   Substance Use Topics   . Alcohol use: Yes     Comment: occasionally    . Drug use: Yes     Comment: marijuana   [4]  No family history on file.  [5]  No Known Allergies     Nivia Dorise PARAS, MD  06/24/24 2250

## 2024-06-24 NOTE — Progress Notes (Signed)
 [facility]  [facility]:  [address], [place], [place] [address]  T [phone]  F [phone]  Wound Follow-up - Nursing Documentation    PATIENT: [name] DOB: 1966-03-23   MR #: [id number]  AGE: 58 y.o.    REFERRING MD:  Elisabeth Lamar DASEN, MD PRIMARY MD: Elisabeth Lamar DASEN, MD      Chief Complaint   Patient presents with    Edema        Assessment & Plan     Case Management:  Patient identification verified using two identifiers.  This patient has been determined to be at risk for a fall, extra precautionary measures have been taken to ensure their safety.    RN reconciled meds and allergies.  Patient healed and discharged. Patient to f/u as needed.    Treatment:  In clinic: moisturizer, lidex to intact skin, patien't own velcro wraps.    Patient Education:  Elevate legs for 30 mins, 3x/day, above the level of the heart. Above the level of the heart, is key here. The easiest way to achieve this is to lay down flat and prop your legs up with at least 2 pillows.    Patient and/or Caregiver verbalized understanding of discharge instructions/plan of care.    Alexa Means, RN   06/24/2024

## 2024-06-24 NOTE — ED Triage Notes (Signed)
 Pt states some slight abdominal pain.  Endorses nausea along with it.  States that he has had some tingling and burning with his urination.  Would like to be checked out to ensure his kidney function is alright

## 2024-06-24 NOTE — Progress Notes (Signed)
 Ambulatory [facility]    Wound Follow-Up - Provider Documentation    PATIENT: [name] DOB: 1965-10-13   MR #: [id number]  AGE: 58 y.o.    REFERRING MD:  Elisabeth Lamar DASEN, MD PRIMARY MD: Elisabeth Lamar DASEN, MD        Chief Complaint   Patient presents with    Edema      The patient was seen in the office today and a follow up evaluation was performed, including a history & focused exam.     [name] is a 58 year old black male who presents today with Patient reports he has had recurrent wounds and swelling of both lower legs for the past two years.  He was being treated at a wound center in [place] but moved to [place]  in July 2023.  He has been self treating with gauze/coban dressings and took a prescription for penicillin in January 2024.  PMH significant for morbid obesity, current tobacco user, hypertention, GERD and chronic back pain. Negative for diabetes.   The patient states his chief complaint is wounds of bilateral lower legs.    History of Present Illness   [name] is a 58 y.o. male who presents to [facility] wound healing center bilateral lower extremity lymphedema, edema and chronic left leg ulcers.  Patient denies fever, chills, nausea, vomiting, and leg pain.     Patient's right leg ulcer is healed however patient still have open left leg ulcer.  Patient has bilateral lower extremity lymphedema/leg edema.    Patient has been treated with Dakin's 10 minutes soaking, gentamicin, Zetuvit.  He is under multilayer compression for his leg for his leg swelling control.    Interval HPI 06/24/2024: Patient was treated with Dakin's 10 minutes soaking, direct application of wound boot.  Patient has no new complaint.  Patient brought his Velcro compression garment today.    Past Medical History     Past Medical History:   Diagnosis Date    GERD (gastroesophageal reflux disease)     Hypertension     Lower back pain        Past Surgical History    No past surgical history on file.    Family History   No family history on file.    Social History   Social History[1]    Allergies   Allergies[2]    Medications   Current Medications[3]    Physical Exam     Vitals:    06/24/24 1103   BP: 146/88   Pulse: 82   Resp: 18   Temp: 97.8 F (36.6 C)       Body mass index is 54.78 kg/m.    General:  Patient appears their stated age, well-nourished.  Alert and in no apparent distress.  Lungs: Respiratory effort unlabored, no signs of distress  Vascular: normal capillary refill  Skin: see focused wound care exam  Neuro: AAOx3 normal speech and appropriate affect, see wound care exam for sensory exam    Focused Wound Care Exam   Wound Assessment:   Wound 10/31/20 Edema/Edema Management Lower Leg Anterior;Left (Active)   Date First Assessed/Time First Assessed: 10/31/20 0800   Primary Wound Type: Edema/Edema Management  Edema extremity: LLE  Edema Swelling: Pitting  Location: Lower Leg  Wound Location Orientation: Anterior;Left      Assessments 06/24/2024 11:09 AM 06/24/2024 11:10 AM   Wound Image      Treatments -- Cleansed  Wound 10/31/20 Edema/Edema Management Lower Leg Anterior;Right (Active)   Date First Assessed/Time First Assessed: 10/31/20 0800   Primary Wound Type: Edema/Edema Management  Edema extremity: RLE  Edema Swelling: Pitting  Location: Lower Leg  Wound Location Orientation: Anterior;Right      Assessments 06/24/2024 11:09 AM   Wound Image     Treatments Cleansed       Wound 10/31/20 Edema/Edema Management Lower Leg Anterior;Left (Active)   Date First Assessed/Time First Assessed: 10/31/20 0800   Primary Wound Type: Edema/Edema Management  Edema extremity: LLE  Edema Swelling: Pitting  Location: Lower Leg  Wound Location Orientation: Anterior;Left      Assessments 06/24/2024 11:09 AM 06/24/2024 11:10 AM   Wound Image      Treatments -- Cleansed       Wound 10/31/20 Edema/Edema Management Lower Leg Anterior;Right (Active)   Date First Assessed/Time First  Assessed: 10/31/20 0800   Primary Wound Type: Edema/Edema Management  Edema extremity: RLE  Edema Swelling: Pitting  Location: Lower Leg  Wound Location Orientation: Anterior;Right      Assessments 06/24/2024 11:09 AM   Wound Image     Treatments Cleansed       Wound 10/31/20 Edema/Edema Management Lower Leg Anterior;Left (Active)   Date First Assessed/Time First Assessed: 10/31/20 0800   Primary Wound Type: Edema/Edema Management  Edema extremity: LLE  Edema Swelling: Pitting  Location: Lower Leg  Wound Location Orientation: Anterior;Left      Assessments 06/24/2024 11:09 AM 06/24/2024 11:10 AM   Wound Image      Treatments -- Cleansed       Wound 10/31/20 Edema/Edema Management Lower Leg Anterior;Right (Active)   Date First Assessed/Time First Assessed: 10/31/20 0800   Primary Wound Type: Edema/Edema Management  Edema extremity: RLE  Edema Swelling: Pitting  Location: Lower Leg  Wound Location Orientation: Anterior;Right      Assessments 06/24/2024 11:09 AM   Wound Image     Treatments Cleansed       Wound 10/31/20 Edema/Edema Management Lower Leg Anterior;Left (Active)   Date First Assessed/Time First Assessed: 10/31/20 0800   Primary Wound Type: Edema/Edema Management  Edema extremity: LLE  Edema Swelling: Pitting  Location: Lower Leg  Wound Location Orientation: Anterior;Left      Assessments 06/24/2024 11:09 AM 06/24/2024 11:10 AM   Wound Image      Treatments -- Cleansed       Wound 10/31/20 Edema/Edema Management Lower Leg Anterior;Right (Active)   Date First Assessed/Time First Assessed: 10/31/20 0800   Primary Wound Type: Edema/Edema Management  Edema extremity: RLE  Edema Swelling: Pitting  Location: Lower Leg  Wound Location Orientation: Anterior;Right      Assessments 06/24/2024 11:09 AM   Wound Image     Treatments Cleansed       Wound 10/31/20 Edema/Edema Management Lower Leg Anterior;Left (Active)   Date First Assessed/Time First Assessed: 10/31/20 0800   Primary Wound Type: Edema/Edema Management  Edema  extremity: LLE  Edema Swelling: Pitting  Location: Lower Leg  Wound Location Orientation: Anterior;Left      Assessments 06/24/2024 11:09 AM 06/24/2024 11:10 AM   Wound Image      Treatments -- Cleansed       Wound 10/31/20 Edema/Edema Management Lower Leg Anterior;Right (Active)   Date First Assessed/Time First Assessed: 10/31/20 0800   Primary Wound Type: Edema/Edema Management  Edema extremity: RLE  Edema Swelling: Pitting  Location: Lower Leg  Wound Location Orientation: Anterior;Right      Assessments 06/24/2024 11:09 AM  Wound Image     Treatments Cleansed       Wound 10/31/20 Edema/Edema Management Lower Leg Anterior;Left (Active)   Date First Assessed/Time First Assessed: 10/31/20 0800   Primary Wound Type: Edema/Edema Management  Edema extremity: LLE  Edema Swelling: Pitting  Location: Lower Leg  Wound Location Orientation: Anterior;Left      Assessments 06/24/2024 11:09 AM 06/24/2024 11:10 AM   Wound Image      Treatments -- Cleansed       Wound 10/31/20 Edema/Edema Management Lower Leg Anterior;Right (Active)   Date First Assessed/Time First Assessed: 10/31/20 0800   Primary Wound Type: Edema/Edema Management  Edema extremity: RLE  Edema Swelling: Pitting  Location: Lower Leg  Wound Location Orientation: Anterior;Right      Assessments 06/24/2024 11:09 AM   Wound Image     Treatments Cleansed       Wound 10/31/20 Edema/Edema Management Lower Leg Anterior;Left (Active)   Date First Assessed/Time First Assessed: 10/31/20 0800   Primary Wound Type: Edema/Edema Management  Edema extremity: LLE  Edema Swelling: Pitting  Location: Lower Leg  Wound Location Orientation: Anterior;Left      Assessments 06/24/2024 11:09 AM 06/24/2024 11:10 AM   Wound Image      Treatments -- Cleansed       Wound 10/31/20 Edema/Edema Management Lower Leg Anterior;Right (Active)   Date First Assessed/Time First Assessed: 10/31/20 0800   Primary Wound Type: Edema/Edema Management  Edema extremity: RLE  Edema Swelling: Pitting  Location:  Lower Leg  Wound Location Orientation: Anterior;Right      Assessments 06/24/2024 11:09 AM   Wound Image     Treatments Cleansed           Edema  RLE Edema: Non-pitting; 1+ barely detectable  R Foot Measurements(cm): 26.5 cm  R Ankle Measurements(cm): 25.5 cm  R Calf Measurements(cm): 46 cm  RLE Treatment: RLE Compression garment  LLE Edema: Non-pitting; 1+ barely detectable  L Foot Measurements(cm): 26 cm  L Ankle Measurements(cm): 26 cm  L Calf Measurements(cm): 48 cm  LLE Treatment: LLE Compression garment            Procedure Note   No debridement needed    Procedures     Assessment & Plan   I examined the patient for an wound(s) that has been resistant to healing despite numerous interventions. The plan is to now examine any underlying characteristics that may impede healing. I decided that the wounds are not self limited and significantly impact the patient's daily living.    Wound Assessment and Plan:  The wounds are assessed as followed: Left leg ulcer is healed.  Leg edema is stable.    No sign of clinical infection.    Patient will elevate his leg during daytime with his Velcro compression garment daily use.    The patient was discharged with detailed written and verbal instructions including to call with any skin breakdown.      Roosvelt Needle, DPM  06/24/2024                      [1]   Social History  Socioeconomic History    Marital status: Married   Tobacco Use    Smoking status: Former     Current packs/day: 0.00     Average packs/day: 1 pack/day for 29.0 years (29.0 ttl pk-yrs)     Types: Cigarettes     Start date: 68     Quit date: 2024  Years since quitting: 1.7    Smokeless tobacco: Current   Vaping Use    Vaping status: Former   Substance and Sexual Activity    Alcohol use: Yes     Comment: occasionally     Drug use: Yes     Comment: marijuana     Social Drivers of Health     Food Insecurity: Unknown (11/07/2022)    Hunger Vital Sign     Worried About Running Out of Food in the Last Year: Never  true   Social Connections: Unknown (01/30/2022)    Received from [facility]    Social Network     Social Network: Not on file   Intimate Partner Violence: Unknown (12/22/2021)    Received from [facility]    HITS     Physically Hurt: Not on file     Insult or Talk Down To: Not on file     Threaten Physical Harm: Not on file     Scream or Curse: Not on file   [2] No Known Allergies  [3]   Current Outpatient Medications:     albuterol  sulfate HFA (PROVENTIL ) 108 (90 Base) MCG/ACT inhaler, Inhale 2 puffs into the lungs every 4 (four) hours as needed for Wheezing or Shortness of Breath (coughing) Dispense with spacer, Disp: 6.7 g, Rfl: 0    amlodipine-benazepril (LOTREL) 10-20 MG per capsule, Take 1 capsule by mouth daily, Disp: , Rfl:     benzonatate  (TESSALON ) 100 MG capsule, Take 1 capsule (100 mg) by mouth 3 (three) times daily as needed for Cough, Disp: 15 capsule, Rfl: 0    candesartan (ATACAND) 16 MG tablet, Take 1 tablet (16 mg) by mouth 2 (two) times daily, Disp: , Rfl:     carvedilol (COREG) 12.5 MG tablet, Take 1 tablet (12.5 mg) by mouth nightly, Disp: , Rfl:     carvedilol (COREG) 25 MG tablet, Take 1 tablet (25 mg) by mouth daily In the morning, Disp: , Rfl:     cloNIDine (CATAPRES) 0.3 MG tablet, Take 1 tablet (0.3 mg) by mouth 2 (two) times daily, Disp: , Rfl:     furosemide (LASIX) 40 MG tablet, Take 1 tablet (40 mg) by mouth 2 (two) times daily, Disp: , Rfl:     hydrALAZINE (APRESOLINE) 50 MG tablet, Take 1 tablet (50 mg) by mouth 2 (two) times daily, Disp: , Rfl:     hydrochlorothiazide (HYDRODIURIL) 25 MG tablet, Take 1 tablet (25 mg) by mouth daily, Disp: , Rfl:     naproxen  (NAPROSYN ) 500 MG tablet, Take 1 tablet (500 mg) by mouth 2 (two) times daily as needed, Disp: , Rfl:     omeprazole (PRILOSEC) 40 MG capsule, Take 1 capsule (40 mg) by mouth daily, Disp: , Rfl:     potassium chloride (KLOR-CON M20) 20 MEQ CR tablet, Take 1 tablet (20 mEq) by mouth once daily, Disp: , Rfl:

## 2024-06-24 NOTE — Progress Notes (Deleted)
 Ambulatory [facility]  [name]:  [address], [place], [place] [address]  T [phone]  F [phone]  Wound Follow-up - Nursing Documentation    PATIENT: [name] DOB: [birth year]   MR #: [id number]  AGE: 58 y.o.    REFERRING MD:  Elisabeth Lamar DASEN, MD PRIMARY MD: Elisabeth Lamar DASEN, MD      Chief Complaint   Patient presents with   . Edema        Assessment & Plan   Patient arrived in the wound center on a wheel chair.     Case Management:  Patient identification verified using two identifiers.  This patient has been determined to be at risk for a fall, extra precautionary measures have been taken to ensure their safety.   RN reconciled meds and allergies.     Treatment:    POC: BLE: unna boot, lidex, abd to bolster, coban 2     RTC: 1 week    Procedures     Patient Education:  - Plan of care and wound dressings.  - reviewed: Elevate legs for 30 mins, 3x/day, above the level of the heart. Above the level of the heart, is key here. The easiest way to achieve this is to lay down flat and prop your legs up with at least 2 pillows.  - Nutrition: with patients with chronic or slow-healing wounds, we recommend protein. Take your bodyweight, divide by 2 = daily protein goal, in grams.   - If wraps get wet/damaged, or if they're too tight, or if s/he has any questions: please call the [facility]. We are open M-F, 8:30-4:30, closed on weekends. If patient needs care during our off hours, please don't hesitate and go to Urgent Care or Emergency Room. Pt never to use scissors to remove wraps if too tight.   - Patient verbalized understanding.      Darice Ravel, RN   06/24/2024

## 2024-06-26 LAB — CULTURE, URINE

## 2024-06-27 ENCOUNTER — Ambulatory Visit: Payer: Self-pay | Admitting: Emergency Medicine

## 2024-06-27 MED ORDER — NITROFURANTOIN MONOHYD MACRO 100 MG PO CAPS
100.0000 mg | ORAL_CAPSULE | Freq: Two times a day (BID) | ORAL | 0 refills | Status: AC
Start: 2024-06-27 — End: 2024-07-04
